# Patient Record
Sex: Male | Born: 1955 | Race: Black or African American | Hispanic: No | Marital: Married | Smoking: Former smoker
Health system: Southern US, Community
[De-identification: ages and names within clinical notes are randomized; demographics above are authoritative.]

## PROBLEM LIST (undated history)

## (undated) DIAGNOSIS — I10 Essential (primary) hypertension: Secondary | ICD-10-CM

## (undated) DIAGNOSIS — L309 Dermatitis, unspecified: Secondary | ICD-10-CM

## (undated) DIAGNOSIS — M1990 Unspecified osteoarthritis, unspecified site: Secondary | ICD-10-CM

## (undated) DIAGNOSIS — T7840XA Allergy, unspecified, initial encounter: Secondary | ICD-10-CM

## (undated) DIAGNOSIS — E785 Hyperlipidemia, unspecified: Secondary | ICD-10-CM

## (undated) DIAGNOSIS — J45909 Unspecified asthma, uncomplicated: Secondary | ICD-10-CM

## (undated) DIAGNOSIS — F1910 Other psychoactive substance abuse, uncomplicated: Secondary | ICD-10-CM

## (undated) HISTORY — DX: Other psychoactive substance abuse, uncomplicated: F19.10

## (undated) HISTORY — DX: Allergy, unspecified, initial encounter: T78.40XA

## (undated) HISTORY — DX: Essential (primary) hypertension: I10

## (undated) HISTORY — PX: KNEE ARTHROSCOPY: SUR90

## (undated) HISTORY — DX: Hyperlipidemia, unspecified: E78.5

## (undated) HISTORY — DX: Dermatitis, unspecified: L30.9

## (undated) HISTORY — PX: COLONOSCOPY: SHX174

## (undated) HISTORY — PX: WISDOM TOOTH EXTRACTION: SHX21

## (undated) HISTORY — DX: Unspecified asthma, uncomplicated: J45.909

---

## 1974-04-08 DIAGNOSIS — F1910 Other psychoactive substance abuse, uncomplicated: Secondary | ICD-10-CM

## 1974-04-08 HISTORY — DX: Other psychoactive substance abuse, uncomplicated: F19.10

## 2004-04-05 ENCOUNTER — Ambulatory Visit: Payer: Self-pay | Admitting: Gastroenterology

## 2004-04-16 ENCOUNTER — Ambulatory Visit: Payer: Self-pay | Admitting: Gastroenterology

## 2004-04-30 ENCOUNTER — Ambulatory Visit: Payer: Self-pay | Admitting: Cardiology

## 2004-05-07 ENCOUNTER — Ambulatory Visit: Payer: Self-pay

## 2004-05-07 ENCOUNTER — Ambulatory Visit: Payer: Self-pay | Admitting: Internal Medicine

## 2004-05-21 ENCOUNTER — Ambulatory Visit: Payer: Self-pay

## 2004-05-24 ENCOUNTER — Ambulatory Visit: Payer: Self-pay

## 2004-06-04 ENCOUNTER — Ambulatory Visit: Payer: Self-pay | Admitting: Cardiology

## 2009-05-04 ENCOUNTER — Encounter (INDEPENDENT_AMBULATORY_CARE_PROVIDER_SITE_OTHER): Payer: Self-pay | Admitting: *Deleted

## 2010-05-08 NOTE — Letter (Signed)
Summary: Colonoscopy Date Change Letter  [facility]  [address] [place], [place] [address]   Phone: [phone]  Fax: [phone]      May 04, 2009 MRN: [id number]   Larry Blair [address] [place], [place]  [address]   Dear [name],   Previously you were recommended to have a repeat colonoscopy around this time. Your chart was recently reviewed by Dr. Arlyce Dice of [facility]. Follow up colonoscopy is now recommended in JAN 2016. This revised recommendation is based on current, nationally recognized guidelines for colorectal cancer screening and polyp surveillance. These guidelines are endorsed by the American Cancer Society, [employer] on Colorectal Cancer as well as numerous other major medical organizations.  Please understand that our recommendation assumes that you do not have any new symptoms such as bleeding, a change in bowel habits, anemia, or significant abdominal discomfort. If you do have any concerning GI symptoms or want to discuss the guideline recommendations, please call to arrange an office visit at your earliest convenience. Otherwise we will keep you in our reminder system and contact you 1-2 months prior to the date listed above to schedule your next colonoscopy.  Thank you,  Barbette Hair. Arlyce Dice, M.D.  [facility] [phone]

## 2014-04-26 ENCOUNTER — Encounter: Payer: Self-pay | Admitting: Gastroenterology

## 2017-06-30 ENCOUNTER — Encounter: Payer: Self-pay | Admitting: Internal Medicine

## 2017-06-30 ENCOUNTER — Ambulatory Visit (INDEPENDENT_AMBULATORY_CARE_PROVIDER_SITE_OTHER): Payer: Self-pay | Admitting: Internal Medicine

## 2017-06-30 ENCOUNTER — Other Ambulatory Visit (INDEPENDENT_AMBULATORY_CARE_PROVIDER_SITE_OTHER): Payer: Self-pay

## 2017-06-30 VITALS — BP 176/110 | HR 82 | Temp 98.6°F | Resp 16 | Ht 71.0 in | Wt 208.5 lb

## 2017-06-30 DIAGNOSIS — Z0000 Encounter for general adult medical examination without abnormal findings: Secondary | ICD-10-CM

## 2017-06-30 DIAGNOSIS — K921 Melena: Secondary | ICD-10-CM

## 2017-06-30 DIAGNOSIS — E559 Vitamin D deficiency, unspecified: Secondary | ICD-10-CM

## 2017-06-30 DIAGNOSIS — I10 Essential (primary) hypertension: Secondary | ICD-10-CM

## 2017-06-30 DIAGNOSIS — Z23 Encounter for immunization: Secondary | ICD-10-CM

## 2017-06-30 DIAGNOSIS — E785 Hyperlipidemia, unspecified: Secondary | ICD-10-CM

## 2017-06-30 LAB — LIPID PANEL
CHOLESTEROL: 180 mg/dL (ref 0–200)
HDL: 81.9 mg/dL (ref >39.00)
LDL CALC: 86 mg/dL (ref 0–99)
NonHDL: 97.98
TRIGLYCERIDES: 62 mg/dL (ref 0.0–149.0)
Total CHOL/HDL Ratio: 2
VLDL: 12.4 mg/dL (ref 0.0–40.0)

## 2017-06-30 LAB — COMPREHENSIVE METABOLIC PANEL
ALT: 15 U/L (ref 0–53)
AST: 20 U/L (ref 0–37)
Albumin: 4 g/dL (ref 3.5–5.2)
Alkaline Phosphatase: 57 U/L (ref 39–117)
BUN: 17 mg/dL (ref 6–23)
CHLORIDE: 103 meq/L (ref 96–112)
CO2: 27 meq/L (ref 19–32)
CREATININE: 0.85 mg/dL (ref 0.40–1.50)
Calcium: 9.4 mg/dL (ref 8.4–10.5)
GFR: 116.83 mL/min (ref >60.00)
Glucose, Bld: 112 mg/dL — ABNORMAL HIGH (ref 70–99)
POTASSIUM: 3.8 meq/L (ref 3.5–5.1)
SODIUM: 139 meq/L (ref 135–145)
Total Bilirubin: 0.8 mg/dL (ref 0.2–1.2)
Total Protein: 7.5 g/dL (ref 6.0–8.3)

## 2017-06-30 LAB — URINALYSIS, ROUTINE W REFLEX MICROSCOPIC
Bilirubin Urine: NEGATIVE
HGB URINE DIPSTICK: NEGATIVE
KETONES UR: NEGATIVE
Leukocytes, UA: NEGATIVE
NITRITE: NEGATIVE
RBC / HPF: NONE SEEN (ref 0–?)
URINE GLUCOSE: NEGATIVE
UROBILINOGEN UA: 0.2 (ref 0.0–1.0)
WBC UA: NONE SEEN (ref 0–?)
pH: 5.5 (ref 5.0–8.0)

## 2017-06-30 LAB — CBC WITH DIFFERENTIAL/PLATELET
BASOS PCT: 0.7 % (ref 0.0–3.0)
Basophils Absolute: 0.1 10*3/uL (ref 0.0–0.1)
EOS ABS: 0.1 10*3/uL (ref 0.0–0.7)
Eosinophils Relative: 1 % (ref 0.0–5.0)
HCT: 43.1 % (ref 39.0–52.0)
Hemoglobin: 14.2 g/dL (ref 13.0–17.0)
LYMPHS ABS: 2 10*3/uL (ref 0.7–4.0)
Lymphocytes Relative: 23.7 % (ref 12.0–46.0)
MCHC: 33.1 g/dL (ref 30.0–36.0)
MCV: 98.1 fl (ref 78.0–100.0)
MONO ABS: 0.8 10*3/uL (ref 0.1–1.0)
Monocytes Relative: 9.7 % (ref 3.0–12.0)
NEUTROS ABS: 5.6 10*3/uL (ref 1.4–7.7)
Neutrophils Relative %: 64.9 % (ref 43.0–77.0)
Platelets: 250 10*3/uL (ref 150.0–400.0)
RBC: 4.39 Mil/uL (ref 4.22–5.81)
RDW: 13.1 % (ref 11.5–15.5)
WBC: 8.6 10*3/uL (ref 4.0–10.5)

## 2017-06-30 LAB — PSA: PSA: 1.61 ng/mL (ref 0.10–4.00)

## 2017-06-30 LAB — VITAMIN D 25 HYDROXY (VIT D DEFICIENCY, FRACTURES): VITD: 17.39 ng/mL — ABNORMAL LOW (ref 30.00–100.00)

## 2017-06-30 LAB — TSH: TSH: 2.84 u[IU]/mL (ref 0.35–4.50)

## 2017-06-30 MED ORDER — CHLORTHALIDONE 25 MG PO TABS: 25.0000 mg | ORAL_TABLET | Freq: Every day | ORAL | 0 refills | Status: DC

## 2017-06-30 MED ORDER — CARVEDILOL 3.125 MG PO TABS: 3.1250 mg | ORAL_TABLET | Freq: Two times a day (BID) | ORAL | 0 refills | Status: DC

## 2017-06-30 MED ORDER — CHOLECALCIFEROL 50 MCG (2000 UT) PO TABS: 1.0000 | ORAL_TABLET | Freq: Every day | ORAL | 1 refills | Status: DC

## 2017-06-30 NOTE — Progress Notes (Signed)
Subjective:  Patient ID: Larry Blair, male    DOB: 18-May-1955  Age: 62 y.o. MRN: [id number]  CC: Hypertension and Annual Exam   HPI Brittian Renaldo presents for a CPX.  He has a hx of HTN but has not treated this in several years. He denies CP, DOE, SOB, palpitations, edema, or fatigue.  History Stancil has a past medical history of Asthma and HTN (hypertension).   He has no past surgical history on file.   His family history includes Cancer in his maternal grandfather and maternal grandmother; Colon cancer in his father; Early death in his brother and sister; Kidney failure in his brother and sister.He reports that he quit smoking about 5 years ago. His smoking use included cigarettes and cigars. He quit smokeless tobacco use about 30 years ago. His smokeless tobacco use included chew. He reports that he drinks about 21.0 oz of alcohol per week. He reports that he has current or past drug history. Drug: Marijuana.  No outpatient medications prior to visit.   No facility-administered medications prior to visit.     ROS Review of Systems  Constitutional: Negative.  Negative for appetite change, diaphoresis, fatigue and unexpected weight change.  HENT: Negative.  Negative for sore throat and trouble swallowing.   Eyes: Negative.   Respiratory: Negative.  Negative for cough, choking, chest tightness, shortness of breath, wheezing and stridor.   Cardiovascular: Negative for chest pain, palpitations and leg swelling.  Gastrointestinal: Positive for blood in stool. Negative for abdominal pain, constipation, diarrhea, nausea and vomiting.       He noticed blood on the toilet paper when he wiped about a month ago.  Endocrine: Negative.   Genitourinary: Negative.  Negative for decreased urine volume, difficulty urinating, dysuria, flank pain, frequency, hematuria and urgency.  Musculoskeletal: Negative.  Negative for arthralgias, myalgias and neck pain.  Skin: Negative.  Negative for  color change, pallor and rash.  Allergic/Immunologic: Negative.   Neurological: Negative.  Negative for dizziness, weakness, light-headedness and headaches.  Hematological: Negative for adenopathy. Does not bruise/bleed easily.  Psychiatric/Behavioral: Negative.     Objective:  BP (!) 176/110 (BP Location: Left Arm, Patient Position: Sitting, Cuff Size: Normal) Comment: BP (R) 172/100 (L) 176/102  Pulse 82   Temp 98.6 F (37 C) (Oral)   Resp 16   Ht 5\' 11"  (1.803 m)   Wt 208 lb 8 oz (94.6 kg)   SpO2 99%   BMI 29.08 kg/m   Physical Exam  Constitutional: He is oriented to person, place, and time. No distress.  HENT:  Mouth/Throat: Oropharynx is clear and moist. No oropharyngeal exudate.  Eyes: Conjunctivae are normal. Right eye exhibits no discharge. Left eye exhibits no discharge. No scleral icterus.  Neck: Normal range of motion. Neck supple. No JVD present. No thyromegaly present.  Cardiovascular: Normal rate, regular rhythm and normal heart sounds. Exam reveals no gallop and no friction rub.  No murmur heard. EKG --  Sinus  Rhythm  WITHIN NORMAL LIMITS  Pulmonary/Chest: Effort normal. No accessory muscle usage. No tachypnea. No respiratory distress. He has no wheezes. He has rhonchi in the right upper field, the right middle field, the right lower field, the left upper field, the left middle field and the left lower field. He has no rales. He exhibits no tenderness.  There are faint, diffuse, bilateral expiratory rhonchi  Abdominal: Soft. Bowel sounds are normal. He exhibits no distension and no mass. There is no tenderness. There is no rebound and no  guarding. Hernia confirmed negative in the right inguinal area and confirmed negative in the left inguinal area.  Genitourinary: Testes normal and penis normal. Rectal exam shows no external hemorrhoid, no internal hemorrhoid, no fissure, no mass, no tenderness, anal tone normal and guaiac negative stool. Prostate is enlarged (1+  smooth symm BPH). Prostate is not tender. Right testis shows no mass, no swelling and no tenderness. Left testis shows no mass, no swelling and no tenderness. No penile erythema or penile tenderness. No discharge found.  Musculoskeletal: Normal range of motion. He exhibits no edema, tenderness or deformity.  Lymphadenopathy:    He has no cervical adenopathy.       Right: No inguinal adenopathy present.       Left: No inguinal adenopathy present.  Neurological: He is alert and oriented to person, place, and time.  Skin: Skin is warm and dry. No rash noted. He is not diaphoretic. No erythema. No pallor.  Psychiatric: He has a normal mood and affect. His behavior is normal. Judgment and thought content normal.  Vitals reviewed.   Lab Results  Component Value Date   WBC 8.6 06/30/2017   HGB 14.2 06/30/2017   HCT 43.1 06/30/2017   PLT 250.0 06/30/2017   GLUCOSE 112 (H) 06/30/2017   CHOL 180 06/30/2017   TRIG 62.0 06/30/2017   HDL 81.90 06/30/2017   LDLCALC 86 06/30/2017   ALT 15 06/30/2017   AST 20 06/30/2017   NA 139 06/30/2017   K 3.8 06/30/2017   CL 103 06/30/2017   CREATININE 0.85 06/30/2017   BUN 17 06/30/2017   CO2 27 06/30/2017   TSH 2.84 06/30/2017   PSA 1.61 06/30/2017    Assessment & Plan:   Cory Munchdolph was seen today for hypertension and annual exam.  Diagnoses and all orders for this visit:  Blood in stool, frank -     Ambulatory referral to Gastroenterology  Routine general medical examination at a health care facility-  Exam completed, labs reviewed, vaccines reviewed and updated, he is referred for colon cancer screening, patient education material was given. -     Lipid panel; Future -     PSA; Future -     Hepatitis C antibody; Future  Essential hypertension- His blood pressure is not adequately well controlled.  I will treat the vitamin D deficiency.  The remainder of his labs are negative for secondary causes or endorgan damage.  His EKG is negative for LVH  or ischemia.  I will start treating the hypertension with carvedilol and chlorthalidone. -     Comprehensive metabolic panel; Future -     CBC with Differential/Platelet; Future -     VITAMIN D 25 Hydroxy (Vit-D Deficiency, Fractures); Future -     Urinalysis, Routine w reflex microscopic; Future -     TSH; Future -     EKG 12-Lead -     carvedilol (COREG) 3.125 MG tablet; Take 1 tablet (3.125 mg total) by mouth 2 (two) times daily with a meal. -     chlorthalidone (HYGROTON) 25 MG tablet; Take 1 tablet (25 mg total) by mouth daily.  Vitamin D deficiency -     Cholecalciferol 2000 units TABS; Take 1 tablet (2,000 Units total) by mouth daily.  Need for Tdap vaccination -     Tdap vaccine greater than or equal to 7yo IM  Need for influenza vaccination -     Flu Vaccine QUAD 36+ mos IM  Hyperlipidemia LDL goal <70- He has an  elevated ASCVD risk score so I have asked him to start taking a statin for CV risk reduction. -     atorvastatin (LIPITOR) 10 MG tablet; Take 1 tablet (10 mg total) by mouth daily.   I am having [name] start on carvedilol, chlorthalidone, Cholecalciferol, and atorvastatin.  Meds ordered this encounter  Medications  . carvedilol (COREG) 3.125 MG tablet    Sig: Take 1 tablet (3.125 mg total) by mouth 2 (two) times daily with a meal.    Dispense:  180 tablet    Refill:  0  . chlorthalidone (HYGROTON) 25 MG tablet    Sig: Take 1 tablet (25 mg total) by mouth daily.    Dispense:  90 tablet    Refill:  0  . Cholecalciferol 2000 units TABS    Sig: Take 1 tablet (2,000 Units total) by mouth daily.    Dispense:  90 tablet    Refill:  1  . atorvastatin (LIPITOR) 10 MG tablet    Sig: Take 1 tablet (10 mg total) by mouth daily.    Dispense:  90 tablet    Refill:  1     Follow-up: Return in about 6 weeks (around 08/11/2017).  Sanda Linger, MD

## 2017-06-30 NOTE — Patient Instructions (Signed)

## 2017-07-01 ENCOUNTER — Encounter: Payer: Self-pay | Admitting: Internal Medicine

## 2017-07-01 DIAGNOSIS — E785 Hyperlipidemia, unspecified: Secondary | ICD-10-CM | POA: Insufficient documentation

## 2017-07-01 LAB — HEPATITIS C ANTIBODY
Hepatitis C Ab: NONREACTIVE
SIGNAL TO CUT-OFF: 0.02 (ref <1.00)

## 2017-07-01 MED ORDER — ATORVASTATIN CALCIUM 10 MG PO TABS: 10.0000 mg | ORAL_TABLET | Freq: Every day | ORAL | 1 refills | Status: DC

## 2017-07-14 ENCOUNTER — Encounter: Payer: Self-pay | Admitting: Gastroenterology

## 2017-08-11 ENCOUNTER — Encounter: Payer: Self-pay | Admitting: Internal Medicine

## 2017-08-11 ENCOUNTER — Ambulatory Visit (INDEPENDENT_AMBULATORY_CARE_PROVIDER_SITE_OTHER): Payer: Self-pay | Admitting: Internal Medicine

## 2017-08-11 VITALS — BP 130/90 | HR 85 | Temp 98.2°F | Resp 16 | Ht 71.0 in | Wt 201.0 lb

## 2017-08-11 DIAGNOSIS — I10 Essential (primary) hypertension: Secondary | ICD-10-CM

## 2017-08-11 DIAGNOSIS — L309 Dermatitis, unspecified: Secondary | ICD-10-CM | POA: Insufficient documentation

## 2017-08-11 MED ORDER — CRISABOROLE 2 % EX OINT: 1.0000 | TOPICAL_OINTMENT | Freq: Two times a day (BID) | CUTANEOUS | 5 refills | Status: DC

## 2017-08-11 NOTE — Patient Instructions (Signed)

## 2017-08-11 NOTE — Progress Notes (Signed)
Subjective:  Patient ID: Larry Blair, male    DOB: 1955-08-18  Age: 62 y.o. MRN: [id number]  CC: Rash and Hypertension   HPI Leeandre Nordling presents for a BP check - He has been compliant with the carvedilol and chlorthalidone.  He tells me his blood pressure has been well controlled.  He has had no episodes of dizziness, lightheadedness, DOE, chest pain, or shortness of breath.  He complains of a chronic recurrent rash on both hands.  He says he previously saw a dermatologist and was diagnosed with allergic hand eczema.  He does not know what he is allergic to.  He has tried steroids for symptom relief but has not gotten much improvement with that.  Denies any recent known exposures or contacts.  Outpatient Medications Prior to Visit  Medication Sig Dispense Refill  . atorvastatin (LIPITOR) 10 MG tablet Take 1 tablet (10 mg total) by mouth daily. 90 tablet 1  . carvedilol (COREG) 3.125 MG tablet Take 1 tablet (3.125 mg total) by mouth 2 (two) times daily with a meal. 180 tablet 0  . chlorthalidone (HYGROTON) 25 MG tablet Take 1 tablet (25 mg total) by mouth daily. 90 tablet 0  . Cholecalciferol 2000 units TABS Take 1 tablet (2,000 Units total) by mouth daily. (Patient not taking: Reported on 08/11/2017) 90 tablet 1   No facility-administered medications prior to visit.     ROS Review of Systems  Constitutional: Negative.  Negative for chills, diaphoresis, fatigue and fever.  HENT: Negative.   Eyes: Negative for visual disturbance.  Respiratory: Negative for cough, chest tightness, shortness of breath and wheezing.   Cardiovascular: Negative for chest pain, palpitations and leg swelling.  Gastrointestinal: Negative for abdominal pain, constipation, diarrhea, nausea and vomiting.  Endocrine: Negative.   Genitourinary: Negative.   Musculoskeletal: Negative.  Negative for arthralgias and myalgias.  Skin: Positive for rash. Negative for color change.  Neurological: Negative.  Negative  for dizziness, weakness and light-headedness.  Hematological: Negative for adenopathy. Does not bruise/bleed easily.  Psychiatric/Behavioral: Negative.     Objective:  BP 130/90 (BP Location: Left Arm, Patient Position: Sitting, Cuff Size: Large)   Pulse 85   Temp 98.2 F (36.8 C) (Oral)   Resp 16   Ht  (1.803 m)   Wt 201 lb (91.2 kg)   SpO2 96%   BMI 28.03 kg/m   BP Readings from Last 3 Encounters:  08/11/17 130/90  06/30/17 (!) 176/110    Wt Readings from Last 3 Encounters:  08/11/17 201 lb (91.2 kg)  06/30/17 208 lb 8 oz (94.6 kg)    Physical Exam  Constitutional: No distress.  HENT:  Mouth/Throat: Oropharynx is clear and moist. No oropharyngeal exudate.  Eyes: Conjunctivae are normal. No scleral icterus.  Neck: Normal range of motion. Neck supple. No JVD present. No thyromegaly present.  Cardiovascular: Normal rate, regular rhythm and normal heart sounds. Exam reveals no gallop and no friction rub.  No murmur heard. Pulmonary/Chest: Effort normal and breath sounds normal. No stridor. No respiratory distress. He has no wheezes. He has no rales.  Abdominal: Soft. Bowel sounds are normal.  Lymphadenopathy:    He has no cervical adenopathy.  Skin: Skin is warm and dry. Rash noted. He is not diaphoretic.  On the palmar sides of both hands there is mild diffuse hyperkeratosis.  Diffusely, on the dorsum of both hands extending onto the dorsum of both wrists there is fissuring, erythema, and scaly papules.  There are no vesicles or  pustules.  There is no erythema, induration, or fluctuance.  Vitals reviewed.   Lab Results  Component Value Date   WBC 8.6 06/30/2017   HGB 14.2 06/30/2017   HCT 43.1 06/30/2017   PLT 250.0 06/30/2017   GLUCOSE 112 (H) 06/30/2017   CHOL 180 06/30/2017   TRIG 62.0 06/30/2017   HDL 81.90 06/30/2017   LDLCALC 86 06/30/2017   ALT 15 06/30/2017   AST 20 06/30/2017   NA 139 06/30/2017   K 3.8 06/30/2017   CL 103 06/30/2017    CREATININE 0.85 06/30/2017   BUN 17 06/30/2017   CO2 27 06/30/2017   TSH 2.84 06/30/2017   PSA 1.61 06/30/2017    No results found.  Assessment & Plan:   Foster was seen today for rash and hypertension.  Diagnoses and all orders for this visit:  Chronic eczema of hand -     Crisaborole (EUCRISA) 2 % OINT; Apply 1 Act topically 2 (two) times daily.  Essential hypertension- His blood pressure is well controlled on the combination of carvedilol and chlorthalidone.  Will continue these 2 meds at the current doses.   I am having [name] start on [place]. I am also having him maintain his carvedilol, chlorthalidone, Cholecalciferol, and atorvastatin.  Meds ordered this encounter  Medications  . Crisaborole (EUCRISA) 2 % OINT    Sig: Apply 1 Act topically 2 (two) times daily.    Dispense:  60 g    Refill:  5     Follow-up: Return in about 6 months (around 02/11/2018).  Sanda Linger, MD

## 2017-09-06 HISTORY — PX: COLONOSCOPY: SHX174

## 2017-09-08 ENCOUNTER — Ambulatory Visit (AMBULATORY_SURGERY_CENTER): Payer: Self-pay | Admitting: *Deleted

## 2017-09-08 ENCOUNTER — Other Ambulatory Visit: Payer: Self-pay

## 2017-09-08 VITALS — Ht 70.0 in | Wt 202.8 lb

## 2017-09-08 DIAGNOSIS — Z800 Family history of malignant neoplasm of digestive organs: Secondary | ICD-10-CM

## 2017-09-08 DIAGNOSIS — K921 Melena: Secondary | ICD-10-CM

## 2017-09-08 MED ORDER — PEG-KCL-NACL-NASULF-NA ASC-C 140 G PO SOLR: 1.0000 | ORAL | 0 refills | Status: DC

## 2017-09-08 NOTE — Progress Notes (Signed)
No egg or soy allergy known to patient  No issues with past sedation with any surgeries  or procedures, no intubation problems  No diet pills per patient No home 02 use per patient  No blood thinners per patient  Pt denies issues with constipation  No A fib or A flutter  EMMI video sent to pt's e mail  Self pay, patient given Plenvu sample  Lot [id number] Exp 10/2018, use as directed

## 2017-09-22 ENCOUNTER — Ambulatory Visit (AMBULATORY_SURGERY_CENTER): Payer: Self-pay | Admitting: Gastroenterology

## 2017-09-22 ENCOUNTER — Other Ambulatory Visit: Payer: Self-pay

## 2017-09-22 ENCOUNTER — Encounter: Payer: Self-pay | Admitting: Gastroenterology

## 2017-09-22 VITALS — BP 167/97 | HR 70 | Temp 96.8°F | Resp 16 | Ht 71.0 in | Wt 201.0 lb

## 2017-09-22 DIAGNOSIS — Z1211 Encounter for screening for malignant neoplasm of colon: Secondary | ICD-10-CM

## 2017-09-22 DIAGNOSIS — Z800 Family history of malignant neoplasm of digestive organs: Secondary | ICD-10-CM

## 2017-09-22 LAB — HM COLONOSCOPY

## 2017-09-22 MED ORDER — SODIUM CHLORIDE 0.9 % IV SOLN: 500.0000 mL | Freq: Once | INTRAVENOUS | Status: DC

## 2017-09-22 NOTE — Patient Instructions (Signed)
Handouts given:  Hemorrhoids  YOU HAD AN ENDOSCOPIC PROCEDURE TODAY AT THE [facility]:   Refer to the procedure report that was given to you for any specific questions about what was found during the examination.  If the procedure report does not answer your questions, please call your gastroenterologist to clarify.  If you requested that your care partner not be given the details of your procedure findings, then the procedure report has been included in a sealed envelope for you to review at your convenience later.  YOU SHOULD EXPECT: Some feelings of bloating in the abdomen. Passage of more gas than usual.  Walking can help get rid of the air that was put into your GI tract during the procedure and reduce the bloating. If you had a lower endoscopy (such as a colonoscopy or flexible sigmoidoscopy) you may notice spotting of blood in your stool or on the toilet paper. If you underwent a bowel prep for your procedure, you may not have a normal bowel movement for a few days.  Please Note:  You might notice some irritation and congestion in your nose or some drainage.  This is from the oxygen used during your procedure.  There is no need for concern and it should clear up in a day or so.  SYMPTOMS TO REPORT IMMEDIATELY:   Following lower endoscopy (colonoscopy or flexible sigmoidoscopy):  Excessive amounts of blood in the stool  Significant tenderness or worsening of abdominal pains  Swelling of the abdomen that is new, acute  Fever of 100F or higher  For urgent or emergent issues, a gastroenterologist can be reached at any hour by calling [phone].   DIET:  We do recommend a small meal at first, but then you may proceed to your regular diet.  Drink plenty of fluids but you should avoid alcoholic beverages for 24 hours.  ACTIVITY:  You should plan to take it easy for the rest of today and you should NOT DRIVE or use heavy machinery until tomorrow (because of the sedation  medicines used during the test).    FOLLOW UP: Our staff will call the number listed on your records the next business day following your procedure to check on you and address any questions or concerns that you may have regarding the information given to you following your procedure. If we do not reach you, we will leave a message.  However, if you are feeling well and you are not experiencing any problems, there is no need to return our call.  We will assume that you have returned to your regular daily activities without incident.  If any biopsies were taken you will be contacted by phone or by letter within the next 1-3 weeks.  Please call us at [phone] if you have not heard about the biopsies in 3 weeks.    SIGNATURES/CONFIDENTIALITY: You and/or your care partner have signed paperwork which will be entered into your electronic medical record.  These signatures attest to the fact that that the information above on your After Visit Summary has been reviewed and is understood.  Full responsibility of the confidentiality of this discharge information lies with you and/or your care-partner. 

## 2017-09-22 NOTE — Op Note (Signed)
[facility] Patient Name: Larry Blair Procedure Date: 09/22/2017 8:45 AM MRN: [id number] Endoscopist: Sherilyn CooterHenry L. Myrtie Neitheranis , MD Age: 62 Referring MD:  Date of Birth: Aug 13, 1955 Gender: Male Account #: [id number] Procedure:                Colonoscopy Indications:              Screening in patient at increased risk: Colorectal                            cancer in father 7660 or older Medicines:                Monitored Anesthesia Care Procedure:                Pre-Anesthesia Assessment:                           - Prior to the procedure, a History and Physical                            was performed, and patient medications and                            allergies were reviewed. The patient's tolerance of                            previous anesthesia was also reviewed. The risks                            and benefits of the procedure and the sedation                            options and risks were discussed with the patient.                            All questions were answered, and informed consent                            was obtained. Prior Anticoagulants: The patient has                            taken no previous anticoagulant or antiplatelet                            agents. ASA Grade Assessment: II - A patient with                            mild systemic disease. After reviewing the risks                            and benefits, the patient was deemed in                            satisfactory condition to undergo the procedure.  After obtaining informed consent, the colonoscope                            was passed under direct vision. Throughout the                            procedure, the patient's blood pressure, pulse, and                            oxygen saturations were monitored continuously. The                            Colonoscope was introduced through the anus and                            advanced to the the cecum,  identified by                            appendiceal orifice and ileocecal valve. The                            colonoscopy was performed without difficulty. The                            patient tolerated the procedure well. The quality                            of the bowel preparation was excellent. The                            ileocecal valve, appendiceal orifice, and rectum                            were photographed. Scope In: 8:51:28 AM Scope Out: 9:02:47 AM Scope Withdrawal Time: 0 hours 8 minutes 37 seconds  Total Procedure Duration: 0 hours 11 minutes 19 seconds  Findings:                 The perianal and digital rectal examinations were                            normal.                           Internal hemorrhoids were found. The hemorrhoids                            were Grade I (internal hemorrhoids that do not                            prolapse).                           The exam was otherwise without abnormality on  direct and retroflexion views. Complications:            No immediate complications. Estimated Blood Loss:     Estimated blood loss: none. Impression:               - Internal hemorrhoids.                           - The examination was otherwise normal on direct                            and retroflexion views.                           - No specimens collected. Recommendation:           - Patient has a contact number available for                            emergencies. The signs and symptoms of potential                            delayed complications were discussed with the                            patient. Return to normal activities tomorrow.                            Written discharge instructions were provided to the                            patient.                           - Resume previous diet.                           - Continue present medications.                           - Repeat colonoscopy in  5 years for screening                            purposes. Josiephine Simao L. Myrtie Neither, MD 09/22/2017 9:05:10 AM This report has been signed electronically.

## 2017-09-22 NOTE — Progress Notes (Signed)
Report to PACU, RN, vss, BBS= Clear.  

## 2017-09-22 NOTE — Progress Notes (Signed)
Pt's states no medical or surgical changes since previsit or office visit. 

## 2017-09-23 ENCOUNTER — Telehealth: Payer: Self-pay

## 2017-09-23 NOTE — Telephone Encounter (Signed)
  Follow up Call-  Call back number 09/22/2017  Post procedure Call Back phone  # [phone]  Permission to leave phone message Yes  Some recent data might be hidden     Patient questions:  Do you have a fever, pain , or abdominal swelling? No. Pain Score  0 *  Have you tolerated food without any problems? Yes.    Have you been able to return to your normal activities? Yes.    Do you have any questions about your discharge instructions: Diet   No. Medications  No. Follow up visit  No.  Do you have questions or concerns about your Care? No.  Actions: * If pain score is 4 or above: No action needed, pain <4.

## 2017-10-29 ENCOUNTER — Other Ambulatory Visit: Payer: Self-pay | Admitting: Internal Medicine

## 2017-10-29 DIAGNOSIS — I10 Essential (primary) hypertension: Secondary | ICD-10-CM

## 2017-11-24 ENCOUNTER — Encounter: Payer: Self-pay | Admitting: Internal Medicine

## 2017-11-24 ENCOUNTER — Other Ambulatory Visit (INDEPENDENT_AMBULATORY_CARE_PROVIDER_SITE_OTHER): Payer: Self-pay

## 2017-11-24 ENCOUNTER — Ambulatory Visit (INDEPENDENT_AMBULATORY_CARE_PROVIDER_SITE_OTHER): Payer: Self-pay | Admitting: Internal Medicine

## 2017-11-24 VITALS — BP 140/100 | HR 72 | Temp 98.5°F | Resp 16 | Ht 71.0 in | Wt 197.2 lb

## 2017-11-24 DIAGNOSIS — T502X5A Adverse effect of carbonic-anhydrase inhibitors, benzothiadiazides and other diuretics, initial encounter: Secondary | ICD-10-CM

## 2017-11-24 DIAGNOSIS — R1115 Cyclical vomiting syndrome unrelated to migraine: Secondary | ICD-10-CM

## 2017-11-24 DIAGNOSIS — E876 Hypokalemia: Secondary | ICD-10-CM

## 2017-11-24 DIAGNOSIS — G43A0 Cyclical vomiting, not intractable: Secondary | ICD-10-CM

## 2017-11-24 DIAGNOSIS — I10 Essential (primary) hypertension: Secondary | ICD-10-CM

## 2017-11-24 LAB — CBC WITH DIFFERENTIAL/PLATELET
BASOS ABS: 0 10*3/uL (ref 0.0–0.1)
Basophils Relative: 0.5 % (ref 0.0–3.0)
EOS PCT: 1.7 % (ref 0.0–5.0)
Eosinophils Absolute: 0.1 10*3/uL (ref 0.0–0.7)
HEMATOCRIT: 39.9 % (ref 39.0–52.0)
Hemoglobin: 13.4 g/dL (ref 13.0–17.0)
LYMPHS ABS: 2.8 10*3/uL (ref 0.7–4.0)
LYMPHS PCT: 39.3 % (ref 12.0–46.0)
MCHC: 33.5 g/dL (ref 30.0–36.0)
MCV: 96.6 fl (ref 78.0–100.0)
MONOS PCT: 9.9 % (ref 3.0–12.0)
Monocytes Absolute: 0.7 10*3/uL (ref 0.1–1.0)
NEUTROS PCT: 48.6 % (ref 43.0–77.0)
Neutro Abs: 3.5 10*3/uL (ref 1.4–7.7)
Platelets: 253 10*3/uL (ref 150.0–400.0)
RBC: 4.13 Mil/uL — AB (ref 4.22–5.81)
RDW: 12.9 % (ref 11.5–15.5)
WBC: 7.1 10*3/uL (ref 4.0–10.5)

## 2017-11-24 LAB — COMPREHENSIVE METABOLIC PANEL
ALK PHOS: 49 U/L (ref 39–117)
ALT: 23 U/L (ref 0–53)
AST: 24 U/L (ref 0–37)
Albumin: 4.2 g/dL (ref 3.5–5.2)
BILIRUBIN TOTAL: 1.2 mg/dL (ref 0.2–1.2)
BUN: 20 mg/dL (ref 6–23)
CALCIUM: 9.4 mg/dL (ref 8.4–10.5)
CO2: 33 meq/L — AB (ref 19–32)
Chloride: 97 mEq/L (ref 96–112)
Creatinine, Ser: 1.07 mg/dL (ref 0.40–1.50)
GFR: 89.46 mL/min (ref >60.00)
GLUCOSE: 99 mg/dL (ref 70–99)
Potassium: 2.9 mEq/L — ABNORMAL LOW (ref 3.5–5.1)
Sodium: 136 mEq/L (ref 135–145)
TOTAL PROTEIN: 7.2 g/dL (ref 6.0–8.3)

## 2017-11-24 LAB — AMYLASE: Amylase: 37 U/L (ref 27–131)

## 2017-11-24 LAB — LIPASE: Lipase: 18 U/L (ref 11.0–59.0)

## 2017-11-24 MED ORDER — CARVEDILOL 6.25 MG PO TABS: 6.2500 mg | ORAL_TABLET | Freq: Two times a day (BID) | ORAL | 1 refills | Status: DC

## 2017-11-24 MED ORDER — SPIRONOLACTONE 25 MG PO TABS: 25.0000 mg | ORAL_TABLET | Freq: Every day | ORAL | 3 refills | Status: DC

## 2017-11-24 MED ORDER — PROMETHAZINE HCL 12.5 MG PO TABS: 12.5000 mg | ORAL_TABLET | Freq: Four times a day (QID) | ORAL | 1 refills | Status: DC | PRN

## 2017-11-24 NOTE — Progress Notes (Signed)
Subjective:  Patient ID: Larry Blair, male    DOB: April 13, 1955  Age: 62 y.o. MRN: [id number]  CC: Hypertension   HPI Larry Blair presents for f/up - He complains of intermittent episodes of vomiting that have been occurring for greater than 2 years.  He describes about 3 or 4 times a year he has a day or 2 where he has spontaneous vomiting without much nausea.  He denies headache, abdominal pain, loss of appetite, weight loss, odynophagia, dysphagia, diarrhea, or constipation.  His risk factors for spontaneous vomiting are alcohol intake and marijuana abuse.    He is also due for blood pressure check. He is concerned that his blood pressure has not been well controlled but he denies any recent episodes of headache, blurred vision, chest pain, or shortness of breath.  Outpatient Medications Prior to Visit  Medication Sig Dispense Refill  . atorvastatin (LIPITOR) 10 MG tablet Take 1 tablet (10 mg total) by mouth daily. 90 tablet 1  . Cholecalciferol 2000 units TABS Take 1 tablet (2,000 Units total) by mouth daily. 90 tablet 1  . Crisaborole (EUCRISA) 2 % OINT Apply 1 Act topically 2 (two) times daily. 60 g 5  . fexofenadine (ALLEGRA) 180 MG tablet Take 180 mg by mouth daily.    . Multiple Vitamin (MULTIVITAMIN) tablet Take 1 tablet by mouth daily.    . carvedilol (COREG) 3.125 MG tablet TAKE 1 TABLET BY MOUTH TWICE DAILY WITH A MEAL 180 tablet 0  . chlorthalidone (HYGROTON) 25 MG tablet TAKE 1 TABLET BY MOUTH ONCE DAILY 90 tablet 0  . oxymetazoline (AFRIN) 0.05 % nasal spray Place 1 spray into both nostrils 2 (two) times daily.    Marland Kitchen. 0.9 %  sodium chloride infusion      No facility-administered medications prior to visit.     ROS Review of Systems  Constitutional: Negative for appetite change, diaphoresis, fatigue and unexpected weight change.  HENT: Negative.  Negative for trouble swallowing.   Eyes: Negative for visual disturbance.  Respiratory: Negative for cough, chest  tightness, shortness of breath and wheezing.   Cardiovascular: Negative for chest pain, palpitations and leg swelling.  Gastrointestinal: Positive for vomiting. Negative for abdominal pain, constipation, diarrhea and nausea.  Genitourinary: Negative.  Negative for difficulty urinating, dysuria and urgency.  Musculoskeletal: Negative.  Negative for arthralgias, back pain, myalgias and neck pain.  Skin: Negative for color change, pallor and rash.  Neurological: Negative.  Negative for dizziness, weakness, light-headedness and headaches.  Hematological: Negative for adenopathy. Does not bruise/bleed easily.  Psychiatric/Behavioral: Negative.     Objective:  BP (!) 140/100 (BP Location: Left Arm, Patient Position: Sitting, Cuff Size: Normal)   Pulse 72   Temp 98.5 F (36.9 C) (Oral)   Resp 16   Ht 5\' 11"  (1.803 m)   Wt 197 lb 4 oz (89.5 kg)   SpO2 98%   BMI 27.51 kg/m   BP Readings from Last 3 Encounters:  11/24/17 (!) 140/100  09/22/17 (!) 167/97  08/11/17 130/90    Wt Readings from Last 3 Encounters:  11/24/17 197 lb 4 oz (89.5 kg)  09/22/17 201 lb (91.2 kg)  09/08/17 202 lb 12.8 oz (92 kg)    Physical Exam  Constitutional: He is oriented to person, place, and time. No distress.  HENT:  Mouth/Throat: Oropharynx is clear and moist. No oropharyngeal exudate.  Eyes: Conjunctivae are normal. No scleral icterus.  Neck: Normal range of motion. Neck supple. No JVD present. No thyromegaly present.  Cardiovascular: Normal rate, regular rhythm and normal heart sounds. Exam reveals no gallop.  No murmur heard. Pulmonary/Chest: Effort normal and breath sounds normal. He has no wheezes. He has no rhonchi. He has no rales.  Abdominal: Soft. Bowel sounds are normal. He exhibits no mass. There is no hepatosplenomegaly. There is no tenderness. No hernia.  Musculoskeletal: Normal range of motion. He exhibits no edema, tenderness or deformity.  Lymphadenopathy:    He has no cervical  adenopathy.  Neurological: He is alert and oriented to person, place, and time.  Skin: Skin is warm and dry. No rash noted. He is not diaphoretic.  Psychiatric: He has a normal mood and affect. His behavior is normal. Judgment and thought content normal.  Vitals reviewed.   Lab Results  Component Value Date   WBC 7.1 11/24/2017   HGB 13.4 11/24/2017   HCT 39.9 11/24/2017   PLT 253.0 11/24/2017   GLUCOSE 99 11/24/2017   CHOL 180 06/30/2017   TRIG 62.0 06/30/2017   HDL 81.90 06/30/2017   LDLCALC 86 06/30/2017   ALT 23 11/24/2017   AST 24 11/24/2017   NA 136 11/24/2017   K 2.9 (L) 11/24/2017   CL 97 11/24/2017   CREATININE 1.07 11/24/2017   BUN 20 11/24/2017   CO2 33 (H) 11/24/2017   TSH 2.84 06/30/2017   PSA 1.61 06/30/2017    No results found.  Assessment & Plan:   Chelsey was seen today for hypertension.  Diagnoses and all orders for this visit:  Essential hypertension- His blood pressure is not well controlled and his potassium level is down to 2.9.  I have asked him to change his diuretic to spironolactone and I will increase the dose of carvedilol to try to gain better blood pressure control.  His other labs are unremarkable. -     CBC with Differential/Platelet; Future -     Comprehensive metabolic panel; Future -     carvedilol (COREG) 6.25 MG tablet; Take 1 tablet (6.25 mg total) by mouth 2 (two) times daily with a meal. -     spironolactone (ALDACTONE) 25 MG tablet; Take 1 tablet (25 mg total) by mouth daily.  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  His lab work is remarkable only for hypokalemia but this is likely related to the thiazide diuretic.  I am concerned that this may be related to his marijuana and alcohol abuse so I have asked him to abstain from both of those.  In the meantime, he can try Phenergan as needed. -     Lipase; Future -     Amylase; Future -      promethazine (PHENERGAN) 12.5 MG tablet; Take 1 tablet (12.5 mg total) by mouth every 6 (six) hours as needed for nausea or vomiting.  Diuretic-induced hypokalemia -     spironolactone (ALDACTONE) 25 MG tablet; Take 1 tablet (25 mg total) by mouth daily.   I have discontinued London Voit's oxymetazoline, carvedilol, and chlorthalidone. I am also having him start on carvedilol, promethazine, and spironolactone. Additionally, I am having him maintain his Cholecalciferol, atorvastatin, Crisaborole, multivitamin, and fexofenadine. We will stop administering sodium chloride.  Meds ordered this encounter  Medications  . carvedilol (COREG) 6.25 MG tablet    Sig: Take 1 tablet (6.25 mg total) by mouth 2 (two) times daily with a meal.    Dispense:  180 tablet    Refill:  1  .  promethazine (PHENERGAN) 12.5 MG tablet    Sig: Take 1 tablet (12.5 mg total) by mouth every 6 (six) hours as needed for nausea or vomiting.    Dispense:  30 tablet    Refill:  1  . spironolactone (ALDACTONE) 25 MG tablet    Sig: Take 1 tablet (25 mg total) by mouth daily.    Dispense:  90 tablet    Refill:  3     Follow-up: Return in about 3 months (around 02/24/2018).  Sanda Lingerhomas Tkai Serfass, MD

## 2017-11-24 NOTE — Patient Instructions (Signed)

## 2018-02-09 ENCOUNTER — Other Ambulatory Visit: Payer: Self-pay | Admitting: Internal Medicine

## 2018-02-09 ENCOUNTER — Other Ambulatory Visit (INDEPENDENT_AMBULATORY_CARE_PROVIDER_SITE_OTHER): Payer: Self-pay

## 2018-02-09 ENCOUNTER — Encounter: Payer: Self-pay | Admitting: Internal Medicine

## 2018-02-09 ENCOUNTER — Ambulatory Visit (INDEPENDENT_AMBULATORY_CARE_PROVIDER_SITE_OTHER): Payer: Self-pay | Admitting: Internal Medicine

## 2018-02-09 VITALS — BP 130/88 | HR 71 | Temp 98.1°F | Resp 16 | Ht 71.0 in | Wt 196.0 lb

## 2018-02-09 DIAGNOSIS — E785 Hyperlipidemia, unspecified: Secondary | ICD-10-CM

## 2018-02-09 DIAGNOSIS — T502X5A Adverse effect of carbonic-anhydrase inhibitors, benzothiadiazides and other diuretics, initial encounter: Secondary | ICD-10-CM

## 2018-02-09 DIAGNOSIS — I10 Essential (primary) hypertension: Secondary | ICD-10-CM

## 2018-02-09 DIAGNOSIS — E876 Hypokalemia: Secondary | ICD-10-CM

## 2018-02-09 DIAGNOSIS — Z23 Encounter for immunization: Secondary | ICD-10-CM

## 2018-02-09 LAB — BASIC METABOLIC PANEL
BUN: 23 mg/dL (ref 6–23)
CHLORIDE: 100 meq/L (ref 96–112)
CO2: 31 meq/L (ref 19–32)
Calcium: 9.4 mg/dL (ref 8.4–10.5)
Creatinine, Ser: 1.06 mg/dL (ref 0.40–1.50)
GFR: 90.37 mL/min (ref >60.00)
Glucose, Bld: 71 mg/dL (ref 70–99)
Potassium: 3.7 mEq/L (ref 3.5–5.1)
SODIUM: 138 meq/L (ref 135–145)

## 2018-02-09 LAB — MAGNESIUM: MAGNESIUM: 2.5 mg/dL (ref 1.5–2.5)

## 2018-02-09 NOTE — Progress Notes (Signed)
Subjective:  Patient ID: Larry Blair, male    DOB: Jun 29, 1955  Age: 62 y.o. MRN: [id number]  CC: Hypertension   HPI Moise Friday presents for f/up - He tells me his blood pressure has been well controlled.  He feels well and offers no complaints.  He quit drinking alcohol about 2 weeks ago.  Outpatient Medications Prior to Visit  Medication Sig Dispense Refill  . atorvastatin (LIPITOR) 10 MG tablet Take 1 tablet (10 mg total) by mouth daily. 90 tablet 1  . carvedilol (COREG) 6.25 MG tablet Take 1 tablet (6.25 mg total) by mouth 2 (two) times daily with a meal. 180 tablet 1  . Cholecalciferol 2000 units TABS Take 1 tablet (2,000 Units total) by mouth daily. 90 tablet 1  . Crisaborole (EUCRISA) 2 % OINT Apply 1 Act topically 2 (two) times daily. 60 g 5  . fexofenadine (ALLEGRA) 180 MG tablet Take 180 mg by mouth daily.    . Multiple Vitamin (MULTIVITAMIN) tablet Take 1 tablet by mouth daily.    Marland Kitchen spironolactone (ALDACTONE) 25 MG tablet Take 1 tablet (25 mg total) by mouth daily. 90 tablet 3  . promethazine (PHENERGAN) 12.5 MG tablet Take 1 tablet (12.5 mg total) by mouth every 6 (six) hours as needed for nausea or vomiting. 30 tablet 1   No facility-administered medications prior to visit.     ROS Review of Systems  Constitutional: Negative for diaphoresis and fatigue.  HENT: Negative.   Eyes: Negative for visual disturbance.  Respiratory: Negative for cough, chest tightness, shortness of breath and wheezing.   Cardiovascular: Negative for chest pain, palpitations and leg swelling.  Gastrointestinal: Negative.  Negative for abdominal pain, diarrhea, nausea and vomiting.  Genitourinary: Negative.  Negative for difficulty urinating.  Musculoskeletal: Negative.  Negative for myalgias.  Skin: Negative.   Neurological: Negative.  Negative for dizziness, weakness and light-headedness.  Hematological: Negative for adenopathy. Does not bruise/bleed easily.  Psychiatric/Behavioral:  Negative.     Objective:  BP 130/88   Pulse 71   Temp 98.1 F (36.7 C) (Oral)   Resp 16   Ht 5\' 11"  (1.803 m)   Wt 196 lb (88.9 kg)   SpO2 99%   BMI 27.34 kg/m   BP Readings from Last 3 Encounters:  02/09/18 130/88  11/24/17 (!) 140/100  09/22/17 (!) 167/97    Wt Readings from Last 3 Encounters:  02/09/18 196 lb (88.9 kg)  11/24/17 197 lb 4 oz (89.5 kg)  09/22/17 201 lb (91.2 kg)    Physical Exam  Constitutional: He is oriented to person, place, and time. No distress.  HENT:  Mouth/Throat: Oropharynx is clear and moist. No oropharyngeal exudate.  Eyes: Conjunctivae are normal. No scleral icterus.  Neck: Normal range of motion. Neck supple. No JVD present. No thyromegaly present.  Cardiovascular: Normal rate, regular rhythm and normal heart sounds. Exam reveals no gallop.  No murmur heard. Pulmonary/Chest: Effort normal and breath sounds normal. No respiratory distress. He has no wheezes. He has no rales.  Abdominal: Soft. Bowel sounds are normal. He exhibits no mass. There is no hepatosplenomegaly. There is no tenderness.  Musculoskeletal: Normal range of motion. He exhibits no edema, tenderness or deformity.  Lymphadenopathy:    He has no cervical adenopathy.  Neurological: He is alert and oriented to person, place, and time.  Skin: Skin is warm and dry. No rash noted. He is not diaphoretic.  Vitals reviewed.   Lab Results  Component Value Date   WBC 7.1  11/24/2017   HGB 13.4 11/24/2017   HCT 39.9 11/24/2017   PLT 253.0 11/24/2017   GLUCOSE 71 02/09/2018   CHOL 180 06/30/2017   TRIG 62.0 06/30/2017   HDL 81.90 06/30/2017   LDLCALC 86 06/30/2017   ALT 23 11/24/2017   AST 24 11/24/2017   NA 138 02/09/2018   K 3.7 02/09/2018   CL 100 02/09/2018   CREATININE 1.06 02/09/2018   BUN 23 02/09/2018   CO2 31 02/09/2018   TSH 2.84 06/30/2017   PSA 1.61 06/30/2017    No results found.  Assessment & Plan:   Steen was seen today for  hypertension.  Diagnoses and all orders for this visit:  Essential hypertension- His blood pressure is well controlled.  Electrolytes and renal function are normal.  Will continue the current combination of carvedilol and spironolactone. -     Basic metabolic panel; Future -     Magnesium; Future  Diuretic-induced hypokalemia- His potassium level is normal now.  Will continue spironolactone at the current dose. -     Basic metabolic panel; Future -     Magnesium; Future  Other orders -     Flu Vaccine QUAD 6+ mos PF IM (Fluarix Quad PF)   I have discontinued Murrel Weinert's promethazine. I am also having him maintain his Cholecalciferol, atorvastatin, Crisaborole, multivitamin, fexofenadine, carvedilol, and spironolactone.  No orders of the defined types were placed in this encounter.    Follow-up: Return in about 6 months (around 08/10/2018).  Sanda Linger, MD

## 2018-02-09 NOTE — Patient Instructions (Signed)

## 2018-09-02 ENCOUNTER — Other Ambulatory Visit: Payer: Self-pay | Admitting: Internal Medicine

## 2018-09-02 DIAGNOSIS — I10 Essential (primary) hypertension: Secondary | ICD-10-CM

## 2018-09-02 DIAGNOSIS — E785 Hyperlipidemia, unspecified: Secondary | ICD-10-CM

## 2019-01-28 ENCOUNTER — Other Ambulatory Visit: Payer: Self-pay | Admitting: Internal Medicine

## 2019-01-28 DIAGNOSIS — T502X5A Adverse effect of carbonic-anhydrase inhibitors, benzothiadiazides and other diuretics, initial encounter: Secondary | ICD-10-CM

## 2019-01-28 DIAGNOSIS — I10 Essential (primary) hypertension: Secondary | ICD-10-CM

## 2019-01-28 DIAGNOSIS — E876 Hypokalemia: Secondary | ICD-10-CM

## 2019-02-01 ENCOUNTER — Other Ambulatory Visit: Payer: Self-pay

## 2019-02-01 ENCOUNTER — Ambulatory Visit (INDEPENDENT_AMBULATORY_CARE_PROVIDER_SITE_OTHER): Payer: Self-pay

## 2019-02-01 DIAGNOSIS — Z23 Encounter for immunization: Secondary | ICD-10-CM

## 2019-07-12 ENCOUNTER — Ambulatory Visit (INDEPENDENT_AMBULATORY_CARE_PROVIDER_SITE_OTHER): Payer: Self-pay

## 2019-07-12 ENCOUNTER — Other Ambulatory Visit: Payer: Self-pay

## 2019-07-12 ENCOUNTER — Ambulatory Visit (INDEPENDENT_AMBULATORY_CARE_PROVIDER_SITE_OTHER): Payer: Self-pay | Admitting: Internal Medicine

## 2019-07-12 ENCOUNTER — Encounter: Payer: Self-pay | Admitting: Internal Medicine

## 2019-07-12 VITALS — BP 166/112 | Temp 98.4°F | Resp 16 | Ht 71.0 in | Wt 197.0 lb

## 2019-07-12 DIAGNOSIS — G8911 Acute pain due to trauma: Secondary | ICD-10-CM

## 2019-07-12 DIAGNOSIS — M542 Cervicalgia: Secondary | ICD-10-CM | POA: Insufficient documentation

## 2019-07-12 DIAGNOSIS — R270 Ataxia, unspecified: Secondary | ICD-10-CM

## 2019-07-12 DIAGNOSIS — M25511 Pain in right shoulder: Secondary | ICD-10-CM | POA: Insufficient documentation

## 2019-07-12 DIAGNOSIS — Z0000 Encounter for general adult medical examination without abnormal findings: Secondary | ICD-10-CM

## 2019-07-12 DIAGNOSIS — G3281 Cerebellar ataxia in diseases classified elsewhere: Secondary | ICD-10-CM

## 2019-07-12 DIAGNOSIS — E559 Vitamin D deficiency, unspecified: Secondary | ICD-10-CM

## 2019-07-12 DIAGNOSIS — E785 Hyperlipidemia, unspecified: Secondary | ICD-10-CM

## 2019-07-12 DIAGNOSIS — I10 Essential (primary) hypertension: Secondary | ICD-10-CM

## 2019-07-12 LAB — BASIC METABOLIC PANEL
BUN: 13 mg/dL (ref 6–23)
CO2: 27 mEq/L (ref 19–32)
Calcium: 9.7 mg/dL (ref 8.4–10.5)
Chloride: 104 mEq/L (ref 96–112)
Creatinine, Ser: 0.76 mg/dL (ref 0.40–1.50)
GFR: 124.28 mL/min (ref >60.00)
Glucose, Bld: 97 mg/dL (ref 70–99)
Potassium: 3.5 mEq/L (ref 3.5–5.1)
Sodium: 140 mEq/L (ref 135–145)

## 2019-07-12 LAB — CBC WITH DIFFERENTIAL/PLATELET
Basophils Absolute: 0 10*3/uL (ref 0.0–0.1)
Basophils Relative: 0.4 % (ref 0.0–3.0)
Eosinophils Absolute: 0.1 10*3/uL (ref 0.0–0.7)
Eosinophils Relative: 1 % (ref 0.0–5.0)
HCT: 43 % (ref 39.0–52.0)
Hemoglobin: 14.3 g/dL (ref 13.0–17.0)
Lymphocytes Relative: 24.3 % (ref 12.0–46.0)
Lymphs Abs: 1.6 10*3/uL (ref 0.7–4.0)
MCHC: 33.2 g/dL (ref 30.0–36.0)
MCV: 99.3 fl (ref 78.0–100.0)
Monocytes Absolute: 0.5 10*3/uL (ref 0.1–1.0)
Monocytes Relative: 7.8 % (ref 3.0–12.0)
Neutro Abs: 4.3 10*3/uL (ref 1.4–7.7)
Neutrophils Relative %: 66.5 % (ref 43.0–77.0)
Platelets: 216 10*3/uL (ref 150.0–400.0)
RBC: 4.33 Mil/uL (ref 4.22–5.81)
RDW: 12.6 % (ref 11.5–15.5)
WBC: 6.5 10*3/uL (ref 4.0–10.5)

## 2019-07-12 LAB — LIPID PANEL
Cholesterol: 193 mg/dL (ref 0–200)
HDL: 71.6 mg/dL (ref >39.00)
LDL Cholesterol: 105 mg/dL — ABNORMAL HIGH (ref 0–99)
NonHDL: 121.67
Total CHOL/HDL Ratio: 3
Triglycerides: 81 mg/dL (ref 0.0–149.0)
VLDL: 16.2 mg/dL (ref 0.0–40.0)

## 2019-07-12 LAB — TSH: TSH: 2.85 u[IU]/mL (ref 0.35–4.50)

## 2019-07-12 LAB — HEPATIC FUNCTION PANEL
ALT: 23 U/L (ref 0–53)
AST: 20 U/L (ref 0–37)
Albumin: 4.4 g/dL (ref 3.5–5.2)
Alkaline Phosphatase: 53 U/L (ref 39–117)
Bilirubin, Direct: 0.1 mg/dL (ref 0.0–0.3)
Total Bilirubin: 0.6 mg/dL (ref 0.2–1.2)
Total Protein: 7 g/dL (ref 6.0–8.3)

## 2019-07-12 LAB — VITAMIN D 25 HYDROXY (VIT D DEFICIENCY, FRACTURES): VITD: 32.12 ng/mL (ref 30.00–100.00)

## 2019-07-12 LAB — PSA: PSA: 1.47 ng/mL (ref 0.10–4.00)

## 2019-07-12 MED ORDER — ATORVASTATIN CALCIUM 10 MG PO TABS: 10.0000 mg | ORAL_TABLET | Freq: Every day | ORAL | 1 refills | Status: DC

## 2019-07-12 MED ORDER — INDAPAMIDE 1.25 MG PO TABS: 1.2500 mg | ORAL_TABLET | Freq: Every day | ORAL | 0 refills | Status: DC

## 2019-07-12 MED ORDER — CARVEDILOL 6.25 MG PO TABS: 6.2500 mg | ORAL_TABLET | Freq: Two times a day (BID) | ORAL | 1 refills | Status: DC

## 2019-07-12 NOTE — Progress Notes (Signed)
Subjective:  Patient ID: Larry Blair, male    DOB: July 28, 1955  Age: 64 y.o. MRN: [id number]  CC: Annual Exam and Hypertension  This visit occurred during the SARS-CoV-2 public health emergency.  Safety protocols were in place, including screening questions prior to the visit, additional usage of staff PPE, and extensive cleaning of exam room while observing appropriate contact time as indicated for disinfecting solutions.    HPI Larry Blair presents for a CPX.  He is not currently taking any antihypertensives. He ran out. He has felt off balance for several weeks and then about 2 weeks ago he describes a fall.  He said he was helping his wife get in and out of a wheelchair when he fell to his right side and he feels like he injured the right side of his neck and his shoulder.  He has not been taking anything for pain.  He has not been seen about this and no x-rays have been completed.  He continues to feel off balance.  He denies headache, blurred vision, chest pain, shortness of breath, DOE, palpitations, edema, or fatigue.  Outpatient Medications Prior to Visit  Medication Sig Dispense Refill  . Cholecalciferol 2000 units TABS Take 1 tablet (2,000 Units total) by mouth daily. (Patient not taking: Reported on 07/12/2019) 90 tablet 1  . fexofenadine (ALLEGRA) 180 MG tablet Take 180 mg by mouth daily.    . Multiple Vitamin (MULTIVITAMIN) tablet Take 1 tablet by mouth daily.    Marland Kitchen atorvastatin (LIPITOR) 10 MG tablet Take 1 tablet by mouth once daily (Patient not taking: Reported on 07/12/2019) 90 tablet 1  . carvedilol (COREG) 6.25 MG tablet TAKE 1 TABLET BY MOUTH TWICE DAILY WITH A MEAL (Patient not taking: Reported on 07/12/2019) 180 tablet 1  . Crisaborole (EUCRISA) 2 % OINT Apply 1 Act topically 2 (two) times daily. (Patient not taking: Reported on 07/12/2019) 60 g 5  . spironolactone (ALDACTONE) 25 MG tablet Take 1 tablet (25 mg total) by mouth daily. (Patient not taking: Reported on 07/12/2019)  90 tablet 3   No facility-administered medications prior to visit.    ROS Review of Systems  Constitutional: Negative.  Negative for diaphoresis, fatigue and unexpected weight change.  HENT: Negative.   Eyes: Negative for visual disturbance.  Respiratory: Negative for cough, chest tightness, shortness of breath and wheezing.   Cardiovascular: Negative for chest pain, palpitations and leg swelling.  Gastrointestinal: Negative for abdominal pain, blood in stool, constipation, diarrhea and nausea.  Endocrine: Negative.   Genitourinary: Negative.  Negative for difficulty urinating, dysuria, scrotal swelling, testicular pain and urgency.  Musculoskeletal: Positive for arthralgias, gait problem and neck pain. Negative for back pain, joint swelling and myalgias.  Skin: Negative.   Neurological: Negative for dizziness, weakness, light-headedness, numbness and headaches.  Hematological: Negative for adenopathy. Does not bruise/bleed easily.  Psychiatric/Behavioral: Negative.     Objective:  BP (!) 166/112 (BP Location: Left Arm, Patient Position: Sitting, Cuff Size: Large)   Temp 98.4 F (36.9 C) (Oral)   Resp 16   Ht 5\' 11"  (1.803 m)   Wt 197 lb (89.4 kg)   SpO2 97%   BMI 27.48 kg/m   BP Readings from Last 3 Encounters:  07/12/19 (!) 166/112  02/09/18 130/88  11/24/17 (!) 140/100    Wt Readings from Last 3 Encounters:  07/12/19 197 lb (89.4 kg)  02/09/18 196 lb (88.9 kg)  11/24/17 197 lb 4 oz (89.5 kg)    Physical Exam Vitals reviewed.  Constitutional:      Appearance: Normal appearance. He is not ill-appearing.  HENT:     Nose: Nose normal.     Mouth/Throat:     Mouth: Mucous membranes are moist.  Eyes:     General: No scleral icterus.    Extraocular Movements: Extraocular movements intact.     Conjunctiva/sclera: Conjunctivae normal.     Pupils: Pupils are equal, round, and reactive to light.  Cardiovascular:     Rate and Rhythm: Normal rate and regular rhythm.      Pulses: Normal pulses.     Heart sounds: No murmur. No gallop.      Comments: EKG - NSR, 84 bpm No LVH Normal EKG Pulmonary:     Effort: Pulmonary effort is normal.     Breath sounds: No stridor. No wheezing, rhonchi or rales.  Abdominal:     General: Abdomen is flat. Bowel sounds are normal. There is no distension.     Palpations: Abdomen is soft. There is no hepatomegaly, splenomegaly or mass.     Tenderness: There is no abdominal tenderness.     Hernia: There is no hernia in the left inguinal area.  Genitourinary:    Pubic Area: No rash.      Penis: Normal and circumcised. No discharge, swelling or lesions.      Testes: Normal.        Right: Mass, tenderness or swelling not present.        Left: Mass, tenderness or swelling not present.     Epididymis:     Right: Normal. Not inflamed or enlarged. No mass or tenderness.     Left: Not inflamed or enlarged. No mass or tenderness.  Musculoskeletal:        General: Normal range of motion.     Right shoulder: Normal. No swelling or deformity.     Left shoulder: Normal. No swelling or deformity.     Cervical back: Neck supple. No tenderness.     Right lower leg: No edema.     Left lower leg: No edema.  Lymphadenopathy:     Cervical: No cervical adenopathy.     Lower Body: No right inguinal adenopathy. No left inguinal adenopathy.  Skin:    General: Skin is warm and dry.  Neurological:     General: No focal deficit present.     Mental Status: He is alert and oriented to person, place, and time. Mental status is at baseline.     Cranial Nerves: Cranial nerves are intact. No cranial nerve deficit.     Motor: Motor function is intact. No weakness, tremor, atrophy or abnormal muscle tone.     Coordination: Romberg sign positive. Coordination abnormal.     Deep Tendon Reflexes: Reflexes normal. Babinski sign absent on the right side. Babinski sign absent on the left side.     Reflex Scores:      Tricep reflexes are 0 on the right  side and 0 on the left side.      Bicep reflexes are 0 on the right side and 0 on the left side.      Brachioradialis reflexes are 0 on the right side and 0 on the left side.      Patellar reflexes are 0 on the right side and 0 on the left side.      Achilles reflexes are 0 on the right side and 0 on the left side.    Comments: + mild ataxia  Psychiatric:  Mood and Affect: Mood normal.        Behavior: Behavior normal.        Thought Content: Thought content normal.        Judgment: Judgment normal.     Lab Results  Component Value Date   WBC 6.5 07/12/2019   HGB 14.3 07/12/2019   HCT 43.0 07/12/2019   PLT 216.0 07/12/2019   GLUCOSE 97 07/12/2019   CHOL 193 07/12/2019   TRIG 81.0 07/12/2019   HDL 71.60 07/12/2019   LDLCALC 105 (H) 07/12/2019   ALT 23 07/12/2019   AST 20 07/12/2019   NA 140 07/12/2019   K 3.5 07/12/2019   CL 104 07/12/2019   CREATININE 0.76 07/12/2019   BUN 13 07/12/2019   CO2 27 07/12/2019   TSH 2.85 07/12/2019   PSA 1.47 07/12/2019    No results found.  Assessment & Plan:   Jossue was seen today for annual exam and hypertension.  Diagnoses and all orders for this visit:  Essential hypertension- His blood pressure is too high.  Will restart antihypertensives with carvedilol and indapamide. -     CBC with Differential/Platelet; Future -     Basic metabolic panel; Future -     TSH; Future -     Cancel: Urine drugs of abuse scrn w alc, routine (Ref Lab); Future -     Urinalysis, Routine w reflex microscopic; Future -     EKG 12-Lead -     carvedilol (COREG) 6.25 MG tablet; Take 1 tablet (6.25 mg total) by mouth 2 (two) times daily with a meal. -     indapamide (LOZOL) 1.25 MG tablet; Take 1 tablet (1.25 mg total) by mouth daily. -     Urinalysis, Routine w reflex microscopic -     TSH -     Basic metabolic panel -     CBC with Differential/Platelet  Vitamin D deficiency -     VITAMIN D 25 Hydroxy (Vit-D Deficiency, Fractures); Future -      VITAMIN D 25 Hydroxy (Vit-D Deficiency, Fractures)  Hyperlipidemia LDL goal <70- I have asked him to take a statin for CV risk reduction. -     Lipid panel; Future -     Hepatic function panel; Future -     atorvastatin (LIPITOR) 10 MG tablet; Take 1 tablet (10 mg total) by mouth daily. -     Hepatic function panel -     Lipid panel  Routine general medical examination at a health care facility- Exam completed, labs reviewed, he did not receive a pneumonia vaccine today because he is in the process of being vaccinated against COVID-19, cancer screenings are up-to-date, patient education material was given. -     PSA; Future -     PSA  Acute neck pain- Will check a plain film to see if there has been a fracture. -     DG Cervical Spine Complete; Future  Acute shoulder pain due to trauma, right- Will check plain films to see if there is been a fracture. -     DG Shoulder Right; Future  Ataxia- I have asked him to undergo an MRI to see if there is evidence of CVA, tumor, mass, NPH, or small vessel disease. -     MR Brain Wo Contrast; Future  Cerebellar ataxia in diseases classified elsewhere [facility]) -     MR Brain Wo Contrast; Future   I have discontinued Martice Meanor's Crisaborole and spironolactone. I have also  changed his carvedilol and atorvastatin. Additionally, I am having him start on indapamide. Lastly, I am having him maintain his Cholecalciferol, multivitamin, and fexofenadine.  Meds ordered this encounter  Medications  . carvedilol (COREG) 6.25 MG tablet    Sig: Take 1 tablet (6.25 mg total) by mouth 2 (two) times daily with a meal.    Dispense:  180 tablet    Refill:  1  . atorvastatin (LIPITOR) 10 MG tablet    Sig: Take 1 tablet (10 mg total) by mouth daily.    Dispense:  90 tablet    Refill:  1  . indapamide (LOZOL) 1.25 MG tablet    Sig: Take 1 tablet (1.25 mg total) by mouth daily.    Dispense:  90 tablet    Refill:  0     Follow-up: Return in about 6  weeks (around 08/23/2019).  Sanda Linger, MD

## 2019-07-12 NOTE — Patient Instructions (Signed)

## 2019-07-13 ENCOUNTER — Encounter: Payer: Self-pay | Admitting: Internal Medicine

## 2019-07-13 LAB — URINALYSIS, ROUTINE W REFLEX MICROSCOPIC
Bilirubin Urine: NEGATIVE
Hgb urine dipstick: NEGATIVE
Ketones, ur: NEGATIVE
Leukocytes,Ua: NEGATIVE
Nitrite: NEGATIVE
Specific Gravity, Urine: 1.025 (ref 1.000–1.030)
Total Protein, Urine: NEGATIVE
Urine Glucose: NEGATIVE
Urobilinogen, UA: 0.2 (ref 0.0–1.0)
pH: 5.5 (ref 5.0–8.0)

## 2019-08-09 ENCOUNTER — Ambulatory Visit
Admission: RE | Admit: 2019-08-09 | Discharge: 2019-08-09 | Disposition: A | Discharge status: Home / Self Care | Payer: Self-pay | Source: Ambulatory Visit | Attending: Internal Medicine | Admitting: Internal Medicine

## 2019-08-09 ENCOUNTER — Other Ambulatory Visit: Payer: Self-pay

## 2019-08-09 DIAGNOSIS — G3281 Cerebellar ataxia in diseases classified elsewhere: Secondary | ICD-10-CM

## 2019-08-09 DIAGNOSIS — R270 Ataxia, unspecified: Secondary | ICD-10-CM

## 2019-08-10 ENCOUNTER — Other Ambulatory Visit: Payer: Self-pay | Admitting: Internal Medicine

## 2019-08-10 DIAGNOSIS — J3489 Other specified disorders of nose and nasal sinuses: Secondary | ICD-10-CM | POA: Insufficient documentation

## 2019-08-10 DIAGNOSIS — R930 Abnormal findings on diagnostic imaging of skull and head, not elsewhere classified: Secondary | ICD-10-CM

## 2019-08-23 ENCOUNTER — Other Ambulatory Visit: Payer: Self-pay

## 2019-08-23 ENCOUNTER — Encounter: Payer: Self-pay | Admitting: Internal Medicine

## 2019-08-23 ENCOUNTER — Ambulatory Visit (INDEPENDENT_AMBULATORY_CARE_PROVIDER_SITE_OTHER): Payer: Self-pay | Admitting: Internal Medicine

## 2019-08-23 VITALS — BP 152/96 | HR 83 | Temp 98.0°F | Resp 16 | Ht 71.0 in | Wt 188.0 lb

## 2019-08-23 DIAGNOSIS — Z23 Encounter for immunization: Secondary | ICD-10-CM

## 2019-08-23 DIAGNOSIS — I10 Essential (primary) hypertension: Secondary | ICD-10-CM

## 2019-08-23 DIAGNOSIS — R270 Ataxia, unspecified: Secondary | ICD-10-CM

## 2019-08-23 DIAGNOSIS — R3129 Other microscopic hematuria: Secondary | ICD-10-CM

## 2019-08-23 MED ORDER — EDARBI 40 MG PO TABS: 1.0000 | ORAL_TABLET | Freq: Every day | ORAL | 0 refills | Status: DC

## 2019-08-23 NOTE — Patient Instructions (Signed)

## 2019-08-23 NOTE — Progress Notes (Signed)
Subjective:  Patient ID: Larry Blair, male    DOB: 24-Mar-1956  Age: 64 y.o. MRN: [id number]  CC: Hypertension  This visit occurred during the SARS-CoV-2 public health emergency.  Safety protocols were in place, including screening questions prior to the visit, additional usage of staff PPE, and extensive cleaning of exam room while observing appropriate contact time as indicated for disinfecting solutions.   HPI Larry Blair presents for f/up - He sustained a fall about 2 months ago and has had a steady neurological decline since then. His recent MRI only showed small vessel disease.  He complains of a gradual decline in his ability to walk and feeling off balanced.  This has gradually worsened over the 2 months.  He has lost dexterity in the fine motor activity in his hands.  He tells me he has diffuse weakness, numbness, and tingling but much more prominent on the right than the left.  Outpatient Medications Prior to Visit  Medication Sig Dispense Refill  . atorvastatin (LIPITOR) 10 MG tablet Take 1 tablet (10 mg total) by mouth daily. 90 tablet 1  . carvedilol (COREG) 6.25 MG tablet Take 1 tablet (6.25 mg total) by mouth 2 (two) times daily with a meal. 180 tablet 1  . fexofenadine (ALLEGRA) 180 MG tablet Take 180 mg by mouth daily.    . indapamide (LOZOL) 1.25 MG tablet Take 1 tablet (1.25 mg total) by mouth daily. 90 tablet 0  . Multiple Vitamin (MULTIVITAMIN) tablet Take 1 tablet by mouth daily.    . Cholecalciferol 2000 units TABS Take 1 tablet (2,000 Units total) by mouth daily. (Patient not taking: Reported on 08/23/2019) 90 tablet 1   No facility-administered medications prior to visit.    ROS Review of Systems  Constitutional: Positive for fatigue. Negative for chills and diaphoresis.  HENT: Negative.  Negative for trouble swallowing.   Eyes: Negative.  Negative for visual disturbance.  Respiratory: Negative.  Negative for cough, chest tightness, shortness of breath and  wheezing.   Cardiovascular: Negative for chest pain, palpitations and leg swelling.  Gastrointestinal: Negative for abdominal pain, constipation, diarrhea, nausea and vomiting.  Endocrine: Negative.   Genitourinary: Positive for hematuria and urgency. Negative for decreased urine volume, difficulty urinating, dysuria and frequency.       He has noticed a couple of episodes of painless hematuria over the last few weeks.  Musculoskeletal: Positive for arthralgias, gait problem and neck pain. Negative for back pain and myalgias.  Skin: Negative.   Allergic/Immunologic: Negative.   Neurological: Positive for weakness and numbness. Negative for dizziness, tremors, facial asymmetry, light-headedness and headaches.  Hematological: Negative for adenopathy. Does not bruise/bleed easily.  Psychiatric/Behavioral: Negative.     Objective:  BP (!) 152/96 (BP Location: Left Arm, Patient Position: Sitting, Cuff Size: Large)   Pulse 83   Temp 98 F (36.7 C) (Oral)   Resp 16   Ht 5\' 11"  (1.803 m)   Wt 188 lb (85.3 kg)   SpO2 94%   BMI 26.22 kg/m   BP Readings from Last 3 Encounters:  08/23/19 (!) 152/96  07/12/19 (!) 166/112  02/09/18 130/88    Wt Readings from Last 3 Encounters:  08/23/19 188 lb (85.3 kg)  07/12/19 197 lb (89.4 kg)  02/09/18 196 lb (88.9 kg)    Physical Exam Vitals reviewed.  Constitutional:      General: He is not in acute distress.    Appearance: He is ill-appearing (in a wheelchair today). He is not toxic-appearing.  HENT:     Nose: Nose normal.     Mouth/Throat:     Mouth: Mucous membranes are moist.     Pharynx: Oropharynx is clear.  Eyes:     General: No scleral icterus.    Conjunctiva/sclera: Conjunctivae normal.  Cardiovascular:     Rate and Rhythm: Normal rate and regular rhythm.     Heart sounds: No murmur.  Pulmonary:     Effort: Pulmonary effort is normal.     Breath sounds: No stridor. No wheezing, rhonchi or rales.  Abdominal:     General:  Abdomen is flat. Bowel sounds are normal.     Palpations: There is no hepatomegaly, splenomegaly or mass.     Tenderness: There is no abdominal tenderness.  Musculoskeletal:        General: Normal range of motion.     Cervical back: Neck supple.     Right lower leg: No edema.     Left lower leg: No edema.  Lymphadenopathy:     Cervical: No cervical adenopathy.  Skin:    General: Skin is warm and dry.     Coloration: Skin is not pale.  Neurological:     Mental Status: He is alert and oriented to person, place, and time.     Cranial Nerves: No cranial nerve deficit.     Sensory: No sensory deficit.     Motor: Weakness present.     Coordination: Romberg sign positive. Coordination abnormal. Finger-Nose-Finger Test abnormal and Heel to [facility] Test abnormal.     Gait: Gait abnormal and tandem walk abnormal.     Deep Tendon Reflexes: Reflexes normal.     Reflex Scores:      Tricep reflexes are 1+ on the right side and 0 on the left side.      Bicep reflexes are 1+ on the right side and 0 on the left side.      Brachioradialis reflexes are 1+ on the right side and 1+ on the left side.      Patellar reflexes are 1+ on the right side and 1+ on the left side.      Achilles reflexes are 0 on the right side and 0 on the left side.    Comments: Mild weakness in RLE and LUE Wide-based ataxic gait     Lab Results  Component Value Date   WBC 6.5 07/12/2019   HGB 14.3 07/12/2019   HCT 43.0 07/12/2019   PLT 216.0 07/12/2019   GLUCOSE 97 07/12/2019   CHOL 193 07/12/2019   TRIG 81.0 07/12/2019   HDL 71.60 07/12/2019   LDLCALC 105 (H) 07/12/2019   ALT 23 07/12/2019   AST 20 07/12/2019   NA 140 07/12/2019   K 3.5 07/12/2019   CL 104 07/12/2019   CREATININE 0.76 07/12/2019   BUN 13 07/12/2019   CO2 27 07/12/2019   TSH 2.85 07/12/2019   PSA 1.47 07/12/2019    MR Brain Wo Contrast  Result Date: 08/10/2019 CLINICAL DATA:  Numbness in the right shoulder with weakness in the hands. EXAM:  MRI HEAD WITHOUT CONTRAST TECHNIQUE: Multiplanar, multiecho pulse sequences of the brain and surrounding structures were obtained without intravenous contrast. COMPARISON:  None. FINDINGS: Brain: No acute or subacute infarction, hemorrhage, hydrocephalus, extra-axial collection or mass lesion. Mild FLAIR hyperintensity in the cerebral white matter with patchy nonspecific appearance. Brain volume is normal Vascular: Normal flow voids Skull and upper cervical spine: It is normal marrow signal Sinuses/Orbits: Ovoid heterogeneous signal the level  of the sphenoid inter sinus septum or left sphenoid sinus which measures 2.5 x 1.8 cm. IMPRESSION: 1. No specific cause of symptoms and no reversible finding in the brain. 2. Mild white matter disease usually related to chronic small vessel ischemia. No typical demyelinating pattern. 3. 2.5 x 1.8 cm sphenoid sinus abnormality which could be intersinus septal bone lesion (such as fibrous dysplasia), soft tissue mass, or trapped debris. Postcontrast imaging may be required, but first recommend sinus CT. Electronically Signed   By: Marnee Spring M.D.   On: 08/10/2019 05:48    Assessment & Plan:   [place] was seen today for hypertension.  Diagnoses and all orders for this visit:  Ataxia- I do not have a good explanation for what is causing his symptoms.  I have asked him to see neurology about this. -     Ambulatory referral to Neurology  Essential hypertension- His blood pressure is not adequately well controlled.  I have asked him to add an ARB to the current regimen. -     Azilsartan Medoxomil (EDARBI) 40 MG TABS; Take 1 tablet by mouth daily.  Other microscopic hematuria- His urinalysis is negative for red blood cells.  There are no other abnormal elements either.  Will check a urine culture to screen for infection.  If this recurs then we will consider doing a CT scan of his kidneys and bladder. -     CULTURE, URINE COMPREHENSIVE; Future -     Urinalysis,  Routine w reflex microscopic; Future -     Urinalysis, Routine w reflex microscopic -     CULTURE, URINE COMPREHENSIVE  Need for pneumococcal vaccination -     Pneumococcal conjugate vaccine 13-valent   I am having [name] start on Edarbi. I am also having him maintain his Cholecalciferol, multivitamin, fexofenadine, carvedilol, atorvastatin, and indapamide.  Meds ordered this encounter  Medications  . Azilsartan Medoxomil (EDARBI) 40 MG TABS    Sig: Take 1 tablet by mouth daily.    Dispense:  56 tablet    Refill:  0     Follow-up: Return in about 6 weeks (around 10/04/2019).  Sanda Linger, MD

## 2019-08-24 LAB — URINALYSIS, ROUTINE W REFLEX MICROSCOPIC
Bilirubin Urine: NEGATIVE
Hgb urine dipstick: NEGATIVE
Ketones, ur: NEGATIVE
Leukocytes,Ua: NEGATIVE
Nitrite: NEGATIVE
RBC / HPF: NONE SEEN (ref 0–?)
Specific Gravity, Urine: 1.025 (ref 1.000–1.030)
Total Protein, Urine: NEGATIVE
Urine Glucose: NEGATIVE
Urobilinogen, UA: 0.2 (ref 0.0–1.0)
pH: 6 (ref 5.0–8.0)

## 2019-08-24 NOTE — Progress Notes (Addendum)
Assessment/Plan:   1.  Cervical myelopathy  -Patient with evidence of cervical myelopathy.  He has ataxia, hand and leg weakness, right much more so than left.  He has urinary dysfunction.  Given that, this is urgent/emergent.  I encouraged him to go to the emergency room today to get an MRI of the cervical spine.  However, he has to take care of his wife who is in a wheelchair and really does not wish to do this.  Therefore, I had him sit in the room today and called around and was able to get him a stat MRI of the cervical spine at [facility] at 2 PM today.  I did tell him that should he have any complete loss of bladder or bowel control that he should go to the emergency room immediately.  Both patient and son expressed concern and gracious notes for the care that was taken today.  If above neg, we will do emg Addendum:  Got results of MRI cervical spine.  Reviewed MRI personally.  Cord edema at C3-4.  Spoke with the radiologist personally.  Called neurosurgery and spoke with Dr. Venetia Maxon who reviewed the films, who also spoke with Dr. Yetta Barre (on call neurosurgeon who was also in the room).  They asked me to send the patient to their office immediately.  I subsequently called the patient, who was still in radiology.  I talked to the patient, gave them the neurosurgeons address and phone number and he repeated them back to me. Subjective:   Larry Blair was seen today in neurologic consultation at the request of Etta Grandchild, MD.  The consultation is for the evaluation of ataxia.  Medical records made available to me are reviewed.  Patient was seen by primary care in April for complete physical exam.  Notes at that examination indicate that the patient felt off balance for several weeks and then about 2 weeks prior to the examination he had a fall.  Pt states, however, that he had no trouble before the fall and that is when all his problems started.    He was helping his wife get out of her wheelchair when  he fell to the right side (fell because feet got tangled up on the WC foot) and he fell on his face.  No LOC.   felt that he injured his neck and shoulder.  He got up.  He noted a week later that his R leg especially isn't working right.  The entire R arm is numb and he is losing strength in the R hand and dexterity in the R hand.  He is dropping objects.  He has a little numbness of the L arm.  He is noting much more urinary urgency than in the past, so much so that he cannot hold it and may pee on himself.  He is constipated, which is a bit unusual but no loss of bowel control but wonders if that is b/c he isn't eating like he was.  No peri-anal paresthesias.  No other falls.  He has been using a cane since the event.  he is swallowing okay.     MRI brain was ordered and completed on Aug 09, 2019.  I had the opportunity to review that.  Intracranially, there was mild small vessel disease.  There was also noted to be a 2.5 x 1.8 cm sphenoid sinus abnormality, and CT maxillofacial was recommended.  X-ray of the cervical spine was completed on July 13, 2019 which  demonstrated moderate degenerative disc disease, most pronounced at C5-C6.  Patient followed back up with primary care on Aug 23, 2019.  Patient noted loss of dexterity and fine motor activity in the hands.   ALLERGIES:   Allergies  Allergen Reactions  . Sulfur     CURRENT MEDICATIONS:  Outpatient Encounter Medications as of 08/26/2019  Medication Sig  . atorvastatin (LIPITOR) 10 MG tablet Take 1 tablet (10 mg total) by mouth daily.  . Azilsartan Medoxomil (EDARBI) 40 MG TABS Take 1 tablet by mouth daily.  . carvedilol (COREG) 6.25 MG tablet Take 1 tablet (6.25 mg total) by mouth 2 (two) times daily with a meal.  . fexofenadine (ALLEGRA) 180 MG tablet Take 180 mg by mouth daily.  . indapamide (LOZOL) 1.25 MG tablet Take 1 tablet (1.25 mg total) by mouth daily.  . Multiple Vitamin (MULTIVITAMIN) tablet Take 1 tablet by mouth daily.  .  Cholecalciferol 2000 units TABS Take 1 tablet (2,000 Units total) by mouth daily. (Patient not taking: Reported on 08/23/2019)   No facility-administered encounter medications on file as of 08/26/2019.    Objective:   PHYSICAL EXAMINATION:    VITALS:   Vitals:   08/26/19 0857  BP: 131/87  Pulse: 79  SpO2: 97%  Weight: 181 lb (82.1 kg)  Height: 5\' 11"  (1.803 m)    GEN:  Normal appears male in no acute distress.  Appears stated age. HEENT:  Normocephalic, atraumatic. The mucous membranes are moist. The superficial temporal arteries are without ropiness or tenderness. Cardiovascular: Regular rate and rhythm. Lungs: Clear to auscultation bilaterally. Neck/Heme: There are no carotid bruits noted bilaterally.  NEUROLOGICAL: Orientation:  The patient is alert and oriented x 3.   Cranial nerves: There is good facial symmetry.  Extraocular muscles are intact and visual fields are full to confrontational testing. Speech is fluent and clear. Soft palate rises symmetrically and there is no tongue deviation. Hearing is intact to conversational tone. Tone: Tone is good throughout. Sensation: Sensation is intact to light touch and pinprick throughout (facial, trunk, extremities). Vibration is intact at the bilateral big toe. There is no extinction with double simultaneous stimulation. There is no sensory dermatomal level identified. Coordination:  The patient has no difficulty with RAM's or FNF bilaterally. Motor: Strength is 5/5 in the left upper extremity.  There is good grip strength on the right.  There is good biceps strength, triceps strength, deltoid strength on the right.  He has significant decreased strength in the hands on the right, particularly in the interossei.  There are just a few fasciculations over the right triceps, but that was the only place (patient was undressed for the examination).  Patient strength in the proximal musculature of the right leg was 3+/5 and 4+-5-/5 in the  distal right leg.  Strength in the left leg was 5 -/5.   DTR's: Deep tendon reflexes are 2-/4 at the bilateral biceps, triceps, brachioradialis, patella and achilles.  Plantar responses are downgoing bilaterally. Gait and Station: The patient needed help to arise.  He was very ataxic and off balance.  He was wide-based.    Total time spent on today's visit was 100 minutes, including both face-to-face time and nonface-to-face time.  Time included that spent on review of records (prior notes available to me/labs/imaging if pertinent), discussing treatment and goals, answering patient's questions and coordinating care.   Cc:  Janith Lima, MD

## 2019-08-25 ENCOUNTER — Other Ambulatory Visit: Payer: Self-pay

## 2019-08-25 ENCOUNTER — Other Ambulatory Visit: Payer: Self-pay | Admitting: Internal Medicine

## 2019-08-25 ENCOUNTER — Ambulatory Visit
Admission: RE | Admit: 2019-08-25 | Discharge: 2019-08-25 | Disposition: A | Discharge status: Home / Self Care | Payer: Self-pay | Source: Ambulatory Visit | Attending: Internal Medicine | Admitting: Internal Medicine

## 2019-08-25 DIAGNOSIS — J3489 Other specified disorders of nose and nasal sinuses: Secondary | ICD-10-CM

## 2019-08-25 DIAGNOSIS — R930 Abnormal findings on diagnostic imaging of skull and head, not elsewhere classified: Secondary | ICD-10-CM

## 2019-08-25 LAB — CULTURE, URINE COMPREHENSIVE: RESULT:: NO GROWTH

## 2019-08-25 MED ORDER — IOPAMIDOL (ISOVUE-300) INJECTION 61%
75.0000 mL | Freq: Once | INTRAVENOUS | Status: AC | PRN
  Administered 2019-08-25: 75 mL via INTRAVENOUS

## 2019-08-26 ENCOUNTER — Ambulatory Visit (INDEPENDENT_AMBULATORY_CARE_PROVIDER_SITE_OTHER): Payer: Self-pay | Admitting: Neurology

## 2019-08-26 ENCOUNTER — Encounter: Payer: Self-pay | Admitting: Neurology

## 2019-08-26 ENCOUNTER — Other Ambulatory Visit: Payer: Self-pay | Admitting: Neurological Surgery

## 2019-08-26 ENCOUNTER — Other Ambulatory Visit: Payer: Self-pay

## 2019-08-26 ENCOUNTER — Ambulatory Visit (HOSPITAL_COMMUNITY)
Admission: RE | Admit: 2019-08-26 | Discharge: 2019-08-26 | Disposition: A | Discharge status: Home / Self Care | Payer: Self-pay | Source: Ambulatory Visit | Attending: Neurology | Admitting: Neurology

## 2019-08-26 VITALS — BP 131/87 | HR 79 | Ht 71.0 in | Wt 181.0 lb

## 2019-08-26 DIAGNOSIS — G959 Disease of spinal cord, unspecified: Secondary | ICD-10-CM

## 2019-08-26 DIAGNOSIS — G729 Myopathy, unspecified: Secondary | ICD-10-CM

## 2019-08-26 NOTE — Addendum Note (Signed)
Addended by: Kandice Robinsons T on: 08/26/2019 12:56 PM   Modules accepted: Orders

## 2019-08-26 NOTE — Patient Instructions (Signed)
Your MRI has been scheduled for today at [facility] at 2pm you must arrive no later than 1:25p.

## 2019-08-27 ENCOUNTER — Other Ambulatory Visit: Payer: Self-pay

## 2019-08-27 ENCOUNTER — Encounter (HOSPITAL_COMMUNITY): Payer: Self-pay | Admitting: Neurological Surgery

## 2019-08-27 NOTE — Progress Notes (Signed)
[name] denies chest pain or shortness of breath.Mr Larry Blair denies s/s of Covid, he reports that he is scheduled for Covid test Saturday at 1000. I instructed patient to not take Ibuprofen until instructed that it is ok  after surgery.  Mr Larry Blair smokes Larry Blair several times a day, I instructed patient to not smoke within 24 hours prior to surgery.

## 2019-08-28 ENCOUNTER — Other Ambulatory Visit (HOSPITAL_COMMUNITY)
Admission: RE | Admit: 2019-08-28 | Discharge: 2019-08-28 | Disposition: A | Discharge status: Home / Self Care | Payer: HRSA Program | Source: Ambulatory Visit | Attending: Neurological Surgery | Admitting: Neurological Surgery

## 2019-08-28 DIAGNOSIS — Z20822 Contact with and (suspected) exposure to covid-19: Secondary | ICD-10-CM | POA: Insufficient documentation

## 2019-08-28 DIAGNOSIS — Z01812 Encounter for preprocedural laboratory examination: Secondary | ICD-10-CM | POA: Diagnosis present

## 2019-08-28 LAB — SARS CORONAVIRUS 2 (TAT 6-24 HRS): SARS Coronavirus 2: NEGATIVE

## 2019-08-30 ENCOUNTER — Other Ambulatory Visit: Payer: Self-pay

## 2019-08-30 ENCOUNTER — Encounter (HOSPITAL_COMMUNITY): Payer: Self-pay | Admitting: Neurological Surgery

## 2019-08-30 ENCOUNTER — Ambulatory Visit (HOSPITAL_COMMUNITY): Payer: Self-pay | Admitting: Anesthesiology

## 2019-08-30 ENCOUNTER — Observation Stay (HOSPITAL_COMMUNITY)
Admission: RE | Admit: 2019-08-30 | Discharge: 2019-08-31 | Disposition: A | Discharge status: Home / Self Care | Payer: Self-pay | Attending: Neurological Surgery | Admitting: Neurological Surgery

## 2019-08-30 ENCOUNTER — Ambulatory Visit (HOSPITAL_COMMUNITY): Payer: Self-pay

## 2019-08-30 ENCOUNTER — Encounter (HOSPITAL_COMMUNITY)
Admission: RE | Disposition: A | Discharge status: Home / Self Care | Payer: Self-pay | Source: Home / Self Care | Attending: Neurological Surgery

## 2019-08-30 DIAGNOSIS — Z419 Encounter for procedure for purposes other than remedying health state, unspecified: Secondary | ICD-10-CM

## 2019-08-30 DIAGNOSIS — Z981 Arthrodesis status: Secondary | ICD-10-CM

## 2019-08-30 DIAGNOSIS — I10 Essential (primary) hypertension: Secondary | ICD-10-CM | POA: Insufficient documentation

## 2019-08-30 DIAGNOSIS — M19011 Primary osteoarthritis, right shoulder: Secondary | ICD-10-CM | POA: Insufficient documentation

## 2019-08-30 DIAGNOSIS — Z87891 Personal history of nicotine dependence: Secondary | ICD-10-CM | POA: Insufficient documentation

## 2019-08-30 DIAGNOSIS — E785 Hyperlipidemia, unspecified: Secondary | ICD-10-CM | POA: Insufficient documentation

## 2019-08-30 DIAGNOSIS — G959 Disease of spinal cord, unspecified: Secondary | ICD-10-CM

## 2019-08-30 DIAGNOSIS — J45909 Unspecified asthma, uncomplicated: Secondary | ICD-10-CM | POA: Insufficient documentation

## 2019-08-30 DIAGNOSIS — M4802 Spinal stenosis, cervical region: Principal | ICD-10-CM | POA: Insufficient documentation

## 2019-08-30 DIAGNOSIS — M4712 Other spondylosis with myelopathy, cervical region: Secondary | ICD-10-CM | POA: Insufficient documentation

## 2019-08-30 DIAGNOSIS — R269 Unspecified abnormalities of gait and mobility: Secondary | ICD-10-CM | POA: Insufficient documentation

## 2019-08-30 DIAGNOSIS — Z79899 Other long term (current) drug therapy: Secondary | ICD-10-CM | POA: Insufficient documentation

## 2019-08-30 HISTORY — DX: Unspecified osteoarthritis, unspecified site: M19.90

## 2019-08-30 HISTORY — PX: ANTERIOR CERVICAL DECOMP/DISCECTOMY FUSION: SHX1161

## 2019-08-30 LAB — BASIC METABOLIC PANEL
Anion gap: 10 (ref 5–15)
BUN: 14 mg/dL (ref 8–23)
CO2: 29 mmol/L (ref 22–32)
Calcium: 9.5 mg/dL (ref 8.9–10.3)
Chloride: 101 mmol/L (ref 98–111)
Creatinine, Ser: 0.7 mg/dL (ref 0.61–1.24)
GFR calc Af Amer: >60 mL/min (ref >60)
GFR calc non Af Amer: >60 mL/min (ref >60)
Glucose, Bld: 89 mg/dL (ref 70–99)
Potassium: 3.1 mmol/L — ABNORMAL LOW (ref 3.5–5.1)
Sodium: 140 mmol/L (ref 135–145)

## 2019-08-30 LAB — CBC WITH DIFFERENTIAL/PLATELET
Abs Immature Granulocytes: 0.03 10*3/uL (ref 0.00–0.07)
Basophils Absolute: 0 10*3/uL (ref 0.0–0.1)
Basophils Relative: 0 %
Eosinophils Absolute: 0 10*3/uL (ref 0.0–0.5)
Eosinophils Relative: 0 %
HCT: 42.4 % (ref 39.0–52.0)
Hemoglobin: 13.7 g/dL (ref 13.0–17.0)
Immature Granulocytes: 0 %
Lymphocytes Relative: 20 %
Lymphs Abs: 1.6 10*3/uL (ref 0.7–4.0)
MCH: 32.2 pg (ref 26.0–34.0)
MCHC: 32.3 g/dL (ref 30.0–36.0)
MCV: 99.8 fL (ref 80.0–100.0)
Monocytes Absolute: 0.5 10*3/uL (ref 0.1–1.0)
Monocytes Relative: 6 %
Neutro Abs: 6.1 10*3/uL (ref 1.7–7.7)
Neutrophils Relative %: 74 %
Platelets: 208 10*3/uL (ref 150–400)
RBC: 4.25 MIL/uL (ref 4.22–5.81)
RDW: 11.6 % (ref 11.5–15.5)
WBC: 8.3 10*3/uL (ref 4.0–10.5)
nRBC: 0 % (ref 0.0–0.2)

## 2019-08-30 LAB — PROTIME-INR
INR: 1 (ref 0.8–1.2)
Prothrombin Time: 12.6 seconds (ref 11.4–15.2)

## 2019-08-30 LAB — SURGICAL PCR SCREEN
MRSA, PCR: NEGATIVE
Staphylococcus aureus: NEGATIVE

## 2019-08-30 SURGERY — ANTERIOR CERVICAL DECOMPRESSION/DISCECTOMY FUSION 1 LEVEL
Anesthesia: General | Site: Spine Cervical

## 2019-08-30 MED ORDER — SUCCINYLCHOLINE CHLORIDE 200 MG/10ML IV SOSY
PREFILLED_SYRINGE | INTRAVENOUS | Status: AC
  Filled 2019-08-30: qty 10

## 2019-08-30 MED ORDER — CHLORHEXIDINE GLUCONATE CLOTH 2 % EX PADS: 6.0000 | MEDICATED_PAD | Freq: Once | CUTANEOUS | Status: DC

## 2019-08-30 MED ORDER — ALBUTEROL SULFATE HFA 108 (90 BASE) MCG/ACT IN AERS
INHALATION_SPRAY | RESPIRATORY_TRACT | Status: AC
  Filled 2019-08-30: qty 6.7

## 2019-08-30 MED ORDER — BUPIVACAINE HCL (PF) 0.25 % IJ SOLN
INTRAMUSCULAR | Status: AC
  Filled 2019-08-30: qty 30

## 2019-08-30 MED ORDER — SODIUM CHLORIDE 0.9 % IV SOLN
250.0000 mL | INTRAVENOUS | Status: DC
  Administered 2019-08-30: 250 mL via INTRAVENOUS

## 2019-08-30 MED ORDER — CELECOXIB 200 MG PO CAPS
ORAL_CAPSULE | ORAL | Status: AC
  Administered 2019-08-30: 200 mg via ORAL
  Filled 2019-08-30: qty 1

## 2019-08-30 MED ORDER — PHENYLEPHRINE HCL-NACL 10-0.9 MG/250ML-% IV SOLN
INTRAVENOUS | Status: DC | PRN
  Administered 2019-08-30: 25 ug/min via INTRAVENOUS

## 2019-08-30 MED ORDER — SUGAMMADEX SODIUM 200 MG/2ML IV SOLN
INTRAVENOUS | Status: DC | PRN
  Administered 2019-08-30: 160 mg via INTRAVENOUS

## 2019-08-30 MED ORDER — METHOCARBAMOL 500 MG PO TABS
500.0000 mg | ORAL_TABLET | Freq: Four times a day (QID) | ORAL | Status: DC | PRN
  Administered 2019-08-30 – 2019-08-31 (×2): 500 mg via ORAL
  Filled 2019-08-30: qty 1

## 2019-08-30 MED ORDER — DEXAMETHASONE SODIUM PHOSPHATE 10 MG/ML IJ SOLN
INTRAMUSCULAR | Status: AC
  Filled 2019-08-30: qty 1

## 2019-08-30 MED ORDER — SCOPOLAMINE 1 MG/3DAYS TD PT72: 1.0000 | MEDICATED_PATCH | TRANSDERMAL | Status: DC

## 2019-08-30 MED ORDER — PROPOFOL 10 MG/ML IV BOLUS
INTRAVENOUS | Status: DC | PRN
  Administered 2019-08-30: 150 mg via INTRAVENOUS

## 2019-08-30 MED ORDER — SENNA 8.6 MG PO TABS
1.0000 | ORAL_TABLET | Freq: Two times a day (BID) | ORAL | Status: DC
  Administered 2019-08-30: 8.6 mg via ORAL
  Filled 2019-08-30: qty 1

## 2019-08-30 MED ORDER — EPHEDRINE SULFATE-NACL 50-0.9 MG/10ML-% IV SOSY
PREFILLED_SYRINGE | INTRAVENOUS | Status: DC | PRN
  Administered 2019-08-30: 10 mg via INTRAVENOUS

## 2019-08-30 MED ORDER — CELECOXIB 200 MG PO CAPS: 200.0000 mg | ORAL_CAPSULE | Freq: Once | ORAL | Status: AC

## 2019-08-30 MED ORDER — ONDANSETRON HCL 4 MG/2ML IJ SOLN
INTRAMUSCULAR | Status: AC
  Filled 2019-08-30: qty 2

## 2019-08-30 MED ORDER — SODIUM CHLORIDE 0.9 % IV SOLN: INTRAVENOUS | Status: DC | PRN

## 2019-08-30 MED ORDER — ACETAMINOPHEN 650 MG RE SUPP: 650.0000 mg | RECTAL | Status: DC | PRN

## 2019-08-30 MED ORDER — LIDOCAINE 2% (20 MG/ML) 5 ML SYRINGE
INTRAMUSCULAR | Status: AC
  Filled 2019-08-30: qty 5

## 2019-08-30 MED ORDER — POTASSIUM CHLORIDE IN NACL 20-0.9 MEQ/L-% IV SOLN: INTRAVENOUS | Status: DC

## 2019-08-30 MED ORDER — 0.9 % SODIUM CHLORIDE (POUR BTL) OPTIME
TOPICAL | Status: DC | PRN
  Administered 2019-08-30: 1000 mL

## 2019-08-30 MED ORDER — LIDOCAINE 2% (20 MG/ML) 5 ML SYRINGE
INTRAMUSCULAR | Status: DC | PRN
  Administered 2019-08-30: 100 mg via INTRAVENOUS

## 2019-08-30 MED ORDER — MORPHINE SULFATE (PF) 2 MG/ML IV SOLN: 2.0000 mg | INTRAVENOUS | Status: DC | PRN

## 2019-08-30 MED ORDER — EPHEDRINE 5 MG/ML INJ
INTRAVENOUS | Status: AC
  Filled 2019-08-30: qty 10

## 2019-08-30 MED ORDER — PROPOFOL 10 MG/ML IV BOLUS
INTRAVENOUS | Status: AC
  Filled 2019-08-30: qty 20

## 2019-08-30 MED ORDER — DEXAMETHASONE SODIUM PHOSPHATE 10 MG/ML IJ SOLN
10.0000 mg | Freq: Once | INTRAMUSCULAR | Status: AC
  Administered 2019-08-30: 10 mg via INTRAVENOUS
  Filled 2019-08-30: qty 1

## 2019-08-30 MED ORDER — CARVEDILOL 3.125 MG PO TABS: 6.2500 mg | ORAL_TABLET | Freq: Once | ORAL | Status: AC

## 2019-08-30 MED ORDER — ACETAMINOPHEN 500 MG PO TABS: 1000.0000 mg | ORAL_TABLET | Freq: Once | ORAL | Status: AC

## 2019-08-30 MED ORDER — CARVEDILOL 6.25 MG PO TABS
6.2500 mg | ORAL_TABLET | Freq: Two times a day (BID) | ORAL | Status: DC
  Administered 2019-08-30 – 2019-08-31 (×2): 6.25 mg via ORAL
  Filled 2019-08-30 (×2): qty 1

## 2019-08-30 MED ORDER — CEFAZOLIN SODIUM-DEXTROSE 2-4 GM/100ML-% IV SOLN
INTRAVENOUS | Status: AC
  Filled 2019-08-30: qty 100

## 2019-08-30 MED ORDER — PROMETHAZINE HCL 25 MG/ML IJ SOLN: 6.2500 mg | INTRAMUSCULAR | Status: DC | PRN

## 2019-08-30 MED ORDER — FENTANYL CITRATE (PF) 100 MCG/2ML IJ SOLN
INTRAMUSCULAR | Status: DC | PRN
  Administered 2019-08-30: 50 ug via INTRAVENOUS
  Administered 2019-08-30: 25 ug via INTRAVENOUS
  Administered 2019-08-30: 50 ug via INTRAVENOUS

## 2019-08-30 MED ORDER — CHLORHEXIDINE GLUCONATE 0.12 % MT SOLN
OROMUCOSAL | Status: AC
  Administered 2019-08-30: 15 mL via OROMUCOSAL
  Filled 2019-08-30: qty 15

## 2019-08-30 MED ORDER — CARVEDILOL 3.125 MG PO TABS
ORAL_TABLET | ORAL | Status: AC
  Administered 2019-08-30: 6.25 mg via ORAL
  Filled 2019-08-30: qty 2

## 2019-08-30 MED ORDER — MIDAZOLAM HCL 2 MG/2ML IJ SOLN
INTRAMUSCULAR | Status: AC
  Filled 2019-08-30: qty 2

## 2019-08-30 MED ORDER — CHLORHEXIDINE GLUCONATE 0.12 % MT SOLN: 15.0000 mL | Freq: Once | OROMUCOSAL | Status: AC

## 2019-08-30 MED ORDER — SODIUM CHLORIDE 0.9% FLUSH: 3.0000 mL | INTRAVENOUS | Status: DC | PRN

## 2019-08-30 MED ORDER — PHENYLEPHRINE 40 MCG/ML (10ML) SYRINGE FOR IV PUSH (FOR BLOOD PRESSURE SUPPORT)
PREFILLED_SYRINGE | INTRAVENOUS | Status: AC
  Filled 2019-08-30: qty 10

## 2019-08-30 MED ORDER — MIDAZOLAM HCL 2 MG/2ML IJ SOLN
INTRAMUSCULAR | Status: DC | PRN
  Administered 2019-08-30: 2 mg via INTRAVENOUS

## 2019-08-30 MED ORDER — BUPIVACAINE HCL (PF) 0.25 % IJ SOLN
INTRAMUSCULAR | Status: DC | PRN
  Administered 2019-08-30: 5 mL

## 2019-08-30 MED ORDER — FENTANYL CITRATE (PF) 250 MCG/5ML IJ SOLN
INTRAMUSCULAR | Status: AC
  Filled 2019-08-30: qty 5

## 2019-08-30 MED ORDER — ONDANSETRON HCL 4 MG PO TABS: 4.0000 mg | ORAL_TABLET | Freq: Four times a day (QID) | ORAL | Status: DC | PRN

## 2019-08-30 MED ORDER — CEFAZOLIN SODIUM-DEXTROSE 2-4 GM/100ML-% IV SOLN
2.0000 g | Freq: Three times a day (TID) | INTRAVENOUS | Status: AC
  Administered 2019-08-30 – 2019-08-31 (×2): 2 g via INTRAVENOUS
  Filled 2019-08-30 (×2): qty 100

## 2019-08-30 MED ORDER — PHENYLEPHRINE 40 MCG/ML (10ML) SYRINGE FOR IV PUSH (FOR BLOOD PRESSURE SUPPORT)
PREFILLED_SYRINGE | INTRAVENOUS | Status: DC | PRN
  Administered 2019-08-30: 80 ug via INTRAVENOUS

## 2019-08-30 MED ORDER — ONDANSETRON HCL 4 MG/2ML IJ SOLN
INTRAMUSCULAR | Status: DC | PRN
  Administered 2019-08-30: 4 mg via INTRAVENOUS

## 2019-08-30 MED ORDER — ORAL CARE MOUTH RINSE: 15.0000 mL | Freq: Once | OROMUCOSAL | Status: AC

## 2019-08-30 MED ORDER — HEMOSTATIC AGENTS (NO CHARGE) OPTIME
TOPICAL | Status: DC | PRN
  Administered 2019-08-30: 1 via TOPICAL

## 2019-08-30 MED ORDER — SUCCINYLCHOLINE CHLORIDE 20 MG/ML IJ SOLN
INTRAMUSCULAR | Status: DC | PRN
  Administered 2019-08-30: 140 mg via INTRAVENOUS

## 2019-08-30 MED ORDER — METHOCARBAMOL 500 MG PO TABS
ORAL_TABLET | ORAL | Status: AC
  Filled 2019-08-30: qty 1

## 2019-08-30 MED ORDER — IRBESARTAN 300 MG PO TABS
300.0000 mg | ORAL_TABLET | Freq: Every day | ORAL | Status: DC
  Administered 2019-08-30 – 2019-08-31 (×2): 300 mg via ORAL
  Filled 2019-08-30 (×2): qty 1

## 2019-08-30 MED ORDER — FENTANYL CITRATE (PF) 100 MCG/2ML IJ SOLN
INTRAMUSCULAR | Status: AC
  Filled 2019-08-30: qty 2

## 2019-08-30 MED ORDER — ROCURONIUM BROMIDE 10 MG/ML (PF) SYRINGE
PREFILLED_SYRINGE | INTRAVENOUS | Status: AC
  Filled 2019-08-30: qty 10

## 2019-08-30 MED ORDER — SODIUM CHLORIDE 0.9% FLUSH
3.0000 mL | Freq: Two times a day (BID) | INTRAVENOUS | Status: DC
  Administered 2019-08-30: 3 mL via INTRAVENOUS

## 2019-08-30 MED ORDER — THROMBIN 5000 UNITS EX SOLR
CUTANEOUS | Status: DC | PRN
  Administered 2019-08-30 (×2): 5000 [IU] via TOPICAL

## 2019-08-30 MED ORDER — FENTANYL CITRATE (PF) 100 MCG/2ML IJ SOLN
25.0000 ug | INTRAMUSCULAR | Status: DC | PRN
  Administered 2019-08-30: 50 ug via INTRAVENOUS

## 2019-08-30 MED ORDER — SCOPOLAMINE 1 MG/3DAYS TD PT72
MEDICATED_PATCH | TRANSDERMAL | Status: AC
  Administered 2019-08-30: 1.5 mg via TRANSDERMAL
  Filled 2019-08-30: qty 1

## 2019-08-30 MED ORDER — OXYCODONE HCL 5 MG PO TABS
5.0000 mg | ORAL_TABLET | ORAL | Status: DC | PRN
  Administered 2019-08-31: 5 mg via ORAL
  Filled 2019-08-30: qty 1

## 2019-08-30 MED ORDER — ONDANSETRON HCL 4 MG/2ML IJ SOLN: 4.0000 mg | Freq: Four times a day (QID) | INTRAMUSCULAR | Status: DC | PRN

## 2019-08-30 MED ORDER — MENTHOL 3 MG MT LOZG: 1.0000 | LOZENGE | OROMUCOSAL | Status: DC | PRN

## 2019-08-30 MED ORDER — THROMBIN 5000 UNITS EX SOLR
CUTANEOUS | Status: AC
  Filled 2019-08-30: qty 15000

## 2019-08-30 MED ORDER — DEXAMETHASONE SODIUM PHOSPHATE 4 MG/ML IJ SOLN
4.0000 mg | Freq: Four times a day (QID) | INTRAMUSCULAR | Status: DC
  Administered 2019-08-30 (×2): 4 mg via INTRAVENOUS
  Filled 2019-08-30 (×2): qty 1

## 2019-08-30 MED ORDER — ACETAMINOPHEN 500 MG PO TABS
ORAL_TABLET | ORAL | Status: AC
  Administered 2019-08-30: 1000 mg via ORAL
  Filled 2019-08-30: qty 2

## 2019-08-30 MED ORDER — METHOCARBAMOL 1000 MG/10ML IJ SOLN
500.0000 mg | Freq: Four times a day (QID) | INTRAVENOUS | Status: DC | PRN
  Filled 2019-08-30: qty 5

## 2019-08-30 MED ORDER — ROCURONIUM BROMIDE 10 MG/ML (PF) SYRINGE
PREFILLED_SYRINGE | INTRAVENOUS | Status: DC | PRN
  Administered 2019-08-30: 60 mg via INTRAVENOUS

## 2019-08-30 MED ORDER — ACETAMINOPHEN 325 MG PO TABS: 650.0000 mg | ORAL_TABLET | ORAL | Status: DC | PRN

## 2019-08-30 MED ORDER — INDAPAMIDE 1.25 MG PO TABS
1.2500 mg | ORAL_TABLET | Freq: Every day | ORAL | Status: DC
  Administered 2019-08-30 – 2019-08-31 (×2): 1.25 mg via ORAL
  Filled 2019-08-30 (×2): qty 1

## 2019-08-30 MED ORDER — PHENOL 1.4 % MT LIQD: 1.0000 | OROMUCOSAL | Status: DC | PRN

## 2019-08-30 MED ORDER — DEXAMETHASONE 4 MG PO TABS
4.0000 mg | ORAL_TABLET | Freq: Four times a day (QID) | ORAL | Status: DC
  Administered 2019-08-31: 4 mg via ORAL
  Filled 2019-08-30 (×2): qty 1

## 2019-08-30 MED ORDER — THROMBIN 5000 UNITS EX SOLR: OROMUCOSAL | Status: DC | PRN

## 2019-08-30 MED ORDER — CEFAZOLIN SODIUM-DEXTROSE 2-4 GM/100ML-% IV SOLN
2.0000 g | INTRAVENOUS | Status: AC
  Administered 2019-08-30: 2 g via INTRAVENOUS

## 2019-08-30 MED ORDER — LACTATED RINGERS IV SOLN: INTRAVENOUS | Status: DC

## 2019-08-30 MED ORDER — ALBUTEROL SULFATE HFA 108 (90 BASE) MCG/ACT IN AERS
INHALATION_SPRAY | RESPIRATORY_TRACT | Status: DC | PRN
  Administered 2019-08-30: 4 via RESPIRATORY_TRACT

## 2019-08-30 SURGICAL SUPPLY — 53 items
BAG DECANTER FOR FLEXI CONT (MISCELLANEOUS) ×3 IMPLANT
BAND RUBBER #18 3X1/16 STRL (MISCELLANEOUS) ×6 IMPLANT
BASKET BONE COLLECTION (BASKET) IMPLANT
BENZOIN TINCTURE PRP APPL 2/3 (GAUZE/BANDAGES/DRESSINGS) ×3 IMPLANT
BIT DRILL 14MM (INSTRUMENTS) IMPLANT
BUR CARBIDE MATCH 3.0 (BURR) ×3 IMPLANT
CANISTER SUCT 3000ML PPV (MISCELLANEOUS) ×3 IMPLANT
CARTRIDGE OIL MAESTRO DRILL (MISCELLANEOUS) ×1 IMPLANT
CLOSURE WOUND 1/2 X4 (GAUZE/BANDAGES/DRESSINGS) ×1
COVER WAND RF STERILE (DRAPES) ×3 IMPLANT
DIFFUSER DRILL AIR PNEUMATIC (MISCELLANEOUS) ×3 IMPLANT
DRAPE C-ARM 42X72 X-RAY (DRAPES) ×6 IMPLANT
DRAPE LAPAROTOMY 100X72 PEDS (DRAPES) ×3 IMPLANT
DRAPE MICROSCOPE LEICA (MISCELLANEOUS) ×3 IMPLANT
DRILL 14MM (INSTRUMENTS) ×3
DRSG OPSITE POSTOP 3X4 (GAUZE/BANDAGES/DRESSINGS) ×2 IMPLANT
DURAPREP 6ML APPLICATOR 50/CS (WOUND CARE) ×3 IMPLANT
ELECT COATED BLADE 2.86 ST (ELECTRODE) ×3 IMPLANT
ELECT REM PT RETURN 9FT ADLT (ELECTROSURGICAL) ×3
ELECTRODE REM PT RTRN 9FT ADLT (ELECTROSURGICAL) ×1 IMPLANT
GAUZE 4X4 16PLY RFD (DISPOSABLE) IMPLANT
GLOVE BIO SURGEON STRL SZ7 (GLOVE) IMPLANT
GLOVE BIO SURGEON STRL SZ8 (GLOVE) ×3 IMPLANT
GLOVE BIOGEL PI IND STRL 7.0 (GLOVE) IMPLANT
GLOVE BIOGEL PI INDICATOR 7.0 (GLOVE)
GOWN STRL REUS W/ TWL LRG LVL3 (GOWN DISPOSABLE) IMPLANT
GOWN STRL REUS W/ TWL XL LVL3 (GOWN DISPOSABLE) IMPLANT
GOWN STRL REUS W/TWL 2XL LVL3 (GOWN DISPOSABLE) ×3 IMPLANT
GOWN STRL REUS W/TWL LRG LVL3 (GOWN DISPOSABLE)
GOWN STRL REUS W/TWL XL LVL3 (GOWN DISPOSABLE)
HEMOSTAT POWDER KIT SURGIFOAM (HEMOSTASIS) ×3 IMPLANT
KIT BASIN OR (CUSTOM PROCEDURE TRAY) ×3 IMPLANT
KIT TURNOVER KIT B (KITS) ×3 IMPLANT
NDL HYPO 25X1 1.5 SAFETY (NEEDLE) ×1 IMPLANT
NDL SPNL 20GX3.5 QUINCKE YW (NEEDLE) ×1 IMPLANT
NEEDLE HYPO 25X1 1.5 SAFETY (NEEDLE) ×3 IMPLANT
NEEDLE SPNL 20GX3.5 QUINCKE YW (NEEDLE) ×3 IMPLANT
NS IRRIG 1000ML POUR BTL (IV SOLUTION) ×3 IMPLANT
OIL CARTRIDGE MAESTRO DRILL (MISCELLANEOUS) ×3
PACK LAMINECTOMY NEURO (CUSTOM PROCEDURE TRAY) ×3 IMPLANT
PAD ARMBOARD 7.5X6 YLW CONV (MISCELLANEOUS) ×3 IMPLANT
PIN DISTRACTION 14MM (PIN) ×6 IMPLANT
PLATE 16MM (Plate) ×2 IMPLANT
SCREW CANN 4X16 SS S/DRILL (Screw) ×8 IMPLANT
SPACER ASSEM CERV LORD 9M (Spacer) ×2 IMPLANT
SPONGE INTESTINAL PEANUT (DISPOSABLE) ×3 IMPLANT
SPONGE SURGIFOAM ABS GEL SZ50 (HEMOSTASIS) ×3 IMPLANT
STRIP CLOSURE SKIN 1/2X4 (GAUZE/BANDAGES/DRESSINGS) ×2 IMPLANT
SUT VIC AB 3-0 SH 8-18 (SUTURE) ×3 IMPLANT
SUT VICRYL 4-0 PS2 18IN ABS (SUTURE) IMPLANT
TOWEL GREEN STERILE (TOWEL DISPOSABLE) ×3 IMPLANT
TOWEL GREEN STERILE FF (TOWEL DISPOSABLE) ×3 IMPLANT
WATER STERILE IRR 1000ML POUR (IV SOLUTION) ×3 IMPLANT

## 2019-08-30 NOTE — H&P (Signed)
Subjective:   Patient is a 64 y.o. male admitted for cervical stenosis with myeopathy. The patient first presented to me with complaints of neck pain, numbness of the arm(s) and loss of strength of the arm(s). Onset of symptoms was several weeks ago. The pain is described as aching and occurs intermittently. The pain is rated mild, and is located in the neck and radiates to the R>L UE. Difficulty with gait. The symptoms have been progressive. Symptoms are exacerbated by extending head backwards, and are relieved by none.  Previous work up includes MRI of cervical spine, results: spinal stenosis.  Past Medical History:  Diagnosis Date  . Allergy   . Arthritis    right shoulder  . Asthma   . Eczema   . HTN (hypertension)   . Hyperlipidemia   . Substance abuse ([place]) 1976   daily    Past Surgical History:  Procedure Laterality Date  . COLONOSCOPY    . KNEE ARTHROSCOPY Left     Allergies  Allergen Reactions  . Sulfur     Social History   Tobacco Use  . Smoking status: Former Smoker    Types: Cigarettes, Cigars    Quit date: 06/30/2012    Years since quitting: 7.1  . Smokeless tobacco: Former Systems developer    Types: Chew    Quit date: 07/01/1987  Substance Use Topics  . Alcohol use: Yes    Alcohol/week: 4.0 standard drinks    Types: 4 Shots of liquor per week    Comment: + occasional beer    Family History  Problem Relation Age of Onset  . Colon cancer Father   . Early death Brother   . Kidney failure Brother   . Cancer Maternal Grandmother   . Cancer Maternal Grandfather   . Early death Sister   . Kidney failure Sister   . Tongue cancer Maternal Aunt   . Pancreatic cancer Maternal Aunt   . Colon polyps Neg Hx   . Esophageal cancer Neg Hx   . Stomach cancer Neg Hx   . Rectal cancer Neg Hx    Prior to Admission medications   Medication Sig Start Date End Date Taking? Authorizing Provider  acetaminophen (TYLENOL) 650 MG CR tablet Take 1,300 mg by mouth every 8 (eight) hours as  needed for pain.   Yes [provider]  atorvastatin (LIPITOR) 10 MG tablet Take 1 tablet (10 mg total) by mouth daily. 07/12/19  Yes Janith Lima, MD  Azilsartan Medoxomil (EDARBI) 40 MG TABS Take 1 tablet by mouth daily. Patient taking differently: Take 1 tablet by mouth in the morning.  08/23/19  Yes Janith Lima, MD  bisacodyl (DULCOLAX) 5 MG EC tablet Take 5 mg by mouth See admin instructions. Take 5 mg daily, may take a second 5 mg dose as needed for constipation   Yes [provider]  carvedilol (COREG) 6.25 MG tablet Take 1 tablet (6.25 mg total) by mouth 2 (two) times daily with a meal. 07/12/19  Yes Janith Lima, MD  fexofenadine (ALLEGRA) 180 MG tablet Take 180 mg by mouth daily.   Yes [provider]  indapamide (LOZOL) 1.25 MG tablet Take 1 tablet (1.25 mg total) by mouth daily. 07/12/19  Yes Janith Lima, MD  methylPREDNISolone (MEDROL DOSEPAK) 4 MG TBPK tablet Take 4-24 mg by mouth See admin instructions. Take these number of tablets on consecutive ZGYF:7-4-9-4-4-9   Yes [provider]  Multiple Vitamin (MULTIVITAMIN WITH MINERALS) TABS tablet Take 1  tablet by mouth daily. Centrum Silver   Yes [provider]  triamcinolone cream (KENALOG) 0.1 % Apply 1 application topically 2 (two) times daily as needed (skin irritation/rash (hands)).   Yes [provider]  ibuprofen (ADVIL) 200 MG tablet Take 400 mg by mouth every 6 (six) hours as needed (back ache).    [provider]     Review of Systems  Positive ROS: neg  All other systems have been reviewed and were otherwise negative with the exception of those mentioned in the HPI and as above.  Objective: Vital signs in last 24 hours: Temp:  [98.3 F (36.8 C)] 98.3 F (36.8 C) (05/24 1137) Pulse Rate:  [76] 76 (05/24 1137) Resp:  [17] 17 (05/24 1137) BP: (168-186)/(108-111) 168/108 (05/24 1146) SpO2:  [97 %] 97 % (05/24 1137) Weight:  [81.6 kg] 81.6 kg (05/24  1137)  General Appearance: Alert, cooperative, no distress, appears stated age Head: Normocephalic, without obvious abnormality, atraumatic Eyes: PERRL, conjunctiva/corneas clear, EOM's intact      Neck: Supple, symmetrical, trachea midline, Back: Symmetric, no curvature, ROM normal, no CVA tenderness Lungs:  respirations unlabored Heart: Regular rate and rhythm Abdomen: Soft, non-tender Extremities: Extremities normal, atraumatic, no cyanosis or edema Pulses: 2+ and symmetric all extremities Skin: Skin color, texture, turgor normal, no rashes or lesions  NEUROLOGIC:  Mental status: Alert and oriented x4, no aphasia, good attention span, fund of knowledge and memory  Motor Exam - RUE 4/5, RLE 4/5 Sensory Exam - grossly normal Reflexes: 3+ Coordination - grossly normal Gait - spastic Balance -abnormal Cranial Nerves: I: smell Not tested  II: visual acuity  OS: nl    OD: nl  II: visual fields Full to confrontation  II: pupils Equal, round, reactive to light  III,VII: ptosis None  III,IV,VI: extraocular muscles  Full ROM  V: mastication Normal  V: facial light touch sensation  Normal  V,VII: corneal reflex  Present  VII: facial muscle function - upper  Normal  VII: facial muscle function - lower Normal  VIII: hearing Not tested  IX: soft palate elevation  Normal  IX,X: gag reflex Present  XI: trapezius strength  5/5  XI: sternocleidomastoid strength 5/5  XI: neck flexion strength  5/5  XII: tongue strength  Normal    Data Review Lab Results  Component Value Date   WBC 6.5 07/12/2019   HGB 14.3 07/12/2019   HCT 43.0 07/12/2019   MCV 99.3 07/12/2019   PLT 216.0 07/12/2019   Lab Results  Component Value Date   NA 140 07/12/2019   K 3.5 07/12/2019   CL 104 07/12/2019   CO2 27 07/12/2019   BUN 13 07/12/2019   CREATININE 0.76 07/12/2019   GLUCOSE 97 07/12/2019   No results found for: INR, PROTIME  Assessment:   Cervical neck pain with herniated nucleus  pulposus/ 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 Estimated body mass index is 25.1 kg/m as calculated from the following:   Height as of this encounter: 5\' 11"  (1.803 m).   Weight as of this encounter: 81.6 kg.  Patient has failed conservative therapy. Planned surgery : ACDF C3-4  Plan:   I explained the condition and procedure to the patient and answered any questions.  Patient wishes to proceed with procedure as planned. Understands risks/ benefits/ and expected or typical outcomes.  08/30/2019 12:40 PM

## 2019-08-30 NOTE — Anesthesia Preprocedure Evaluation (Addendum)
Anesthesia Evaluation  Patient identified by MRN, date of birth, ID band Patient awake    Reviewed: Allergy & Precautions, NPO status , Patient's Chart, lab work & pertinent test results  History of Anesthesia Complications Negative for: history of anesthetic complications  Airway Mallampati: Trach   Neck ROM: Full    Dental  (+) Dental Advisory Given   Pulmonary asthma , former smoker,    Pulmonary exam normal        Cardiovascular hypertension, Pt. on home beta blockers Normal cardiovascular exam     Neuro/Psych negative neurological ROS     GI/Hepatic negative GI ROS, Neg liver ROS,   Endo/Other  negative endocrine ROS  Renal/GU negative Renal ROS     Musculoskeletal negative musculoskeletal ROS (+)   Abdominal   Peds  Hematology negative hematology ROS (+)   Anesthesia Other Findings   Reproductive/Obstetrics                          Anesthesia Physical Anesthesia Plan  ASA: II  Anesthesia Plan: General   Post-op Pain Management:    Induction: Intravenous  PONV Risk Score and Plan: 3 and Ondansetron, Dexamethasone and Scopolamine patch - Pre-op  Airway Management Planned: Oral ETT and Video Laryngoscope Planned  Additional Equipment:   Intra-op Plan:   Post-operative Plan: Extubation in OR  Informed Consent: I have reviewed the patients History and Physical, chart, labs and discussed the procedure including the risks, benefits and alternatives for the proposed anesthesia with the patient or authorized representative who has indicated his/her understanding and acceptance.     Dental advisory given  Plan Discussed with: Anesthesiologist and Surgeon  Anesthesia Plan Comments:        Anesthesia Quick Evaluation

## 2019-08-30 NOTE — Anesthesia Procedure Notes (Signed)
Procedure Name: Intubation Date/Time: 08/30/2019 1:12 PM Performed by: De Nurse, CRNA Pre-anesthesia Checklist: Patient identified, Emergency Drugs available, Suction available and Patient being monitored Patient Re-evaluated:Patient Re-evaluated prior to induction Oxygen Delivery Method: Circle System Utilized Preoxygenation: Pre-oxygenation with 100% oxygen Induction Type: IV induction and Rapid sequence Ventilation: Mask ventilation without difficulty Laryngoscope Size: Glidescope and 4 Grade View: Grade I Tube type: Oral Tube size: 7.5 mm Number of attempts: 1 Airway Equipment and Method: Stylet and Oral airway Placement Confirmation: ETT inserted through vocal cords under direct vision,  positive ETCO2 and breath sounds checked- equal and bilateral Secured at: 22 cm Tube secured with: Tape Dental Injury: Teeth and Oropharynx as per pre-operative assessment  Comments: Elective Glidescope used d/t limited neck movement

## 2019-08-30 NOTE — Transfer of Care (Signed)
Immediate Anesthesia Transfer of Care Note  Patient: Larry Blair  Procedure(s) Performed: CERVICAL THREE-FOUR ANTERIOR CERVICAL DECOMPRESSION/FUSION (N/A Spine Cervical)  Patient Location: PACU  Anesthesia Type:General  Level of Consciousness: awake, alert  and oriented  Airway & Oxygen Therapy: Patient Spontanous Breathing  Post-op Assessment: Report given to RN and Patient moving all extremities X 4  Post vital signs: Reviewed and stable  Last Vitals:  Vitals Value Taken Time  BP 114/101 08/30/19 1503  Temp    Pulse 71 08/30/19 1503  Resp 19 08/30/19 1503  SpO2 97 % 08/30/19 1503  Vitals shown include unvalidated device data.  Last Pain:  Vitals:   08/30/19 1219  TempSrc:   PainSc: 0-No pain         Complications: No apparent anesthesia complications

## 2019-08-30 NOTE — Progress Notes (Signed)
Patient's blood pressure 168/108.  Patient states that he did not have his Coreg this AM.  Dr. Krista Blue made aware of blood pressure and received verbal order to give 6.25 Coreg.  Will continue to monitor patient.

## 2019-08-30 NOTE — Anesthesia Postprocedure Evaluation (Signed)
Anesthesia Post Note  Patient: Surafel Hilleary  Procedure(s) Performed: CERVICAL THREE-FOUR ANTERIOR CERVICAL DECOMPRESSION/FUSION (N/A Spine Cervical)     Patient location during evaluation: PACU Anesthesia Type: General Level of consciousness: awake and alert Pain management: pain level controlled Vital Signs Assessment: post-procedure vital signs reviewed and stable Respiratory status: spontaneous breathing, nonlabored ventilation, respiratory function stable and patient connected to nasal cannula oxygen Cardiovascular status: blood pressure returned to baseline and stable Postop Assessment: no apparent nausea or vomiting Anesthetic complications: no    Last Vitals:  Vitals:   08/30/19 1515 08/30/19 1530  BP: (!) 153/94 (!) 147/100  Pulse: 67 65  Resp: (!) 21 12  Temp:  36.6 C  SpO2: 97% 94%    Last Pain:  Vitals:   08/30/19 1530  TempSrc:   PainSc: 2                  Reilley Valentine DANIEL

## 2019-08-30 NOTE — Op Note (Signed)
08/30/2019  2:54 PM  PATIENT:  Larry Blair  64 y.o. 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 Decompressive anterior cervical discectomy C3-4, 2. Anterior cervical arthrodesis C3-4 utilizing allograft bone graft, 3. Anterior cervical plating C3-4 utilizing a Alphatec plate  SURGEON:  Sherley Bounds, MD  ASSISTANTS: Glenford Peers, FNP  ANESTHESIA:   General  EBL: 50 ml  Total I/O In: -  Out: 50 [Blood:50]  BLOOD ADMINISTERED: none  DRAINS: none  SPECIMEN:  none  INDICATION FOR PROCEDURE: This patient presented with numbness and weakness in his arms and difficulty with gait. Imaging showed severe stenosis C3-4 with signal change in the cord.  He was seen urgently by his neurologist for sideration of decompressive surgery.  Recommended ACDF with plating. Patient understood the risks, benefits, and alternatives and potential outcomes and wished to proceed.  PROCEDURE DETAILS: Patient was brought to the operating room placed under general endotracheal anesthesia. Patient was placed in the supine position on the operating room table. The neck was prepped with Duraprep and draped in a sterile fashion.   Three cc of local anesthesia was injected and a transverse incision was made on the right side of the neck.  Dissection was carried down thru the subcutaneous tissue and the platysma was  elevated, opened, and undermined with Metzenbaum scissors.  Dissection was then carried out thru an avascular plane leaving the sternocleidomastoid carotid artery and jugular vein laterally and the trachea and esophagus medially with the assistance of my nurse practitioner. The ventral aspect of the vertebral column was identified and a localizing x-ray was taken. The C3-4 level was identified and all in the room agreed with the level. The longus colli muscles were then elevated and the retractor  was placed with the assistance of my nurse practitioner. The annulus was incised and the disc space entered. Discectomy was performed with micro-curettes and pituitary rongeurs. I then used the high-speed drill to drill the endplates down to the level of the posterior longitudinal ligament. The drill shavings were saved in a mucous trap for later arthrodesis. The operating microscope was draped and brought into the field provided additional magnification, illumination and visualization. Discectomy was continued posteriorly thru the disc space. Posterior longitudinal ligament was opened with a nerve hook, and then removed along with disc herniation and osteophytes, decompressing the spinal canal and thecal sac. We then continued to remove osteophytic overgrowth and disc material decompressing the neural foramina and exiting nerve roots bilaterally. The scope was angled up and down to help decompress and undercut the vertebral bodies. Once the decompression was completed we could pass a nerve hook circumferentially to assure adequate decompression in the midline and in the neural foramina. So by both visualization and palpation we felt we had an adequate decompression of the neural elements. We then measured the height of the intravertebral disc space and selected a 9 mm cortical cancellus allograft.  It was then gently positioned in the intravertebral disc space(s) and countersunk. I then used a 16 mm Alphatec plate and placed variable angle screws into the vertebral bodies of each level and locked them into position. The wound was irrigated with bacitracin solution, checked for hemostasis which was established and confirmed. Once meticulous hemostasis was achieved, we then proceeded with closure with the assistance of my nurse practitioner. The platysma was closed with interrupted 3-0 undyed Vicryl suture, the subcuticular layer was closed with interrupted 3-0 undyed  Vicryl suture. The skin edges were approximated  with steristrips. The drapes were removed. A sterile dressing was applied. The patient was then awakened from general anesthesia and transferred to the recovery room in stable condition. At the end of the procedure all sponge, needle and instrument counts were correct.   PLAN OF CARE: Admit for overnight observation  PATIENT DISPOSITION:  PACU - hemodynamically stable.   Delay start of Pharmacological VTE agent (>24hrs) due to surgical blood loss or risk of bleeding:  yes

## 2019-08-31 ENCOUNTER — Telehealth: Payer: Self-pay | Admitting: *Deleted

## 2019-08-31 ENCOUNTER — Encounter: Payer: Self-pay | Admitting: *Deleted

## 2019-08-31 MED ORDER — METHOCARBAMOL 500 MG PO TABS: 500.0000 mg | ORAL_TABLET | Freq: Three times a day (TID) | ORAL | 1 refills | Status: DC | PRN

## 2019-08-31 MED ORDER — OXYCODONE HCL 5 MG PO TABS: 5.0000 mg | ORAL_TABLET | ORAL | 0 refills | Status: DC | PRN

## 2019-08-31 NOTE — Progress Notes (Signed)
CSW received consult to speak with patient. Patient requested help with applying for hospital financial assistance. CSW placed Financial Counseling contact info on AVS for patient. Patient reports that he has an appointment with Social Security on June 22 to apply for Medicare. CSW went over medication costs with patient. He confirmed his pharmacy and stated that he would be able to afford the medications. Patient reported that staff at the hospital have been wonderful and he expressed his appreciation. No other needs identified.   Kohl Polinsky LCSW

## 2019-08-31 NOTE — Progress Notes (Signed)
Patient alert and oriented, mae's well, voiding adequate amount of urine, swallowing without difficulty, no c/o pain at time of discharge. Patient discharged home with family. Script and discharged instructions given to patient. Patient and family stated understanding of instructions given. Patient has an appointment with Dr. Jones °

## 2019-08-31 NOTE — Evaluation (Signed)
Occupational Therapy Evaluation  Patient Details Name: Larry Blair MRN: [id number] DOB: 10-Aug-1955 Today's Date: 08/31/2019    History of Present Illness Patient is a 64 y/o male who presents s/p C3-4 ACDF 08/30/19. PMH includes HTN, HLD and substance abuse.   Clinical Impression   Pt educated in detail in ADL following cervical precautions, compensatory strategies and IADL to avoid. He is overall functioning at a set up to supervision level for safety with ADL. All DME needs are met at home. Pt verbalized and/or demonstrated understanding of all education. No further OT needs.    Follow Up Recommendations  No OT follow up    Equipment Recommendations  None recommended by OT    Recommendations for Other Services       Precautions / Restrictions Precautions Precautions: Cervical Precaution Booklet Issued: Yes (comment) Precaution Comments: Reviewed precautions and handout Restrictions Weight Bearing Restrictions: No      Mobility Bed Mobility Overal bed mobility: Needs Assistance Bed Mobility: Rolling;Sidelying to Sit Rolling: Modified independent (Device/Increase time) Sidelying to sit: Modified independent (Device/Increase time)       General bed mobility comments: HOB flat, no rails to simulate home. Good demo of log roll technique.  Transfers Overall transfer level: Needs assistance Equipment used: Straight cane Transfers: Sit to/from Stand Sit to Stand: Supervision         General transfer comment: Supervision for safety. Stood from [employer].    Balance Overall balance assessment: Needs assistance;History of Falls Sitting-balance support: Feet supported;No upper extremity supported Sitting balance-Leahy Scale: Good     Standing balance support: During functional activity Standing balance-Leahy Scale: Fair Standing balance comment: Requires Ue support for dynamic tasks                           ADL either performed or assessed with clinical  judgement   ADL                                         General ADL Comments: Overall functioning at a set up to supervision level. Educated in compensatory strategies for grooming, pericare and LB dressing. Instructed in IADL to avoid.      Vision Patient Visual Report: No change from baseline       Perception     Praxis      Pertinent Vitals/Pain Pain Assessment: 0-10 Pain Score: 2  Pain Location: surgical site Pain Descriptors / Indicators: Operative site guarding Pain Intervention(s): Monitored during session     Hand Dominance Right   Extremity/Trunk Assessment Upper Extremity Assessment Upper Extremity Assessment: Overall WFL for tasks assessed RUE Deficits / Details: pt able to open containers and use R UE with improved strength and comfort compared to PTA, remains somewhat weaker than L, but functional RUE Sensation: WNL RUE Coordination: decreased fine motor   Lower Extremity Assessment Lower Extremity Assessment: Defer to PT evaluation RLE Deficits / Details: Grossly ~4/5 throughout, but weaker than LLE. RLE Sensation: WNL   Cervical / Trunk Assessment Cervical / Trunk Assessment: Other exceptions Cervical / Trunk Exceptions: s/p neck surgery   Communication Communication Communication: No difficulties   Cognition Arousal/Alertness: Awake/alert Behavior During Therapy: WFL for tasks assessed/performed Overall Cognitive Status: Within Functional Limits for tasks assessed  General Comments  bandage- clean dry and intact.    Exercises     Shoulder Instructions      Home Living Family/patient expects to be discharged to:: Private residence Living Arrangements: Spouse/significant other Available Help at Discharge: Family;Available 24 hours/day Type of Home: House Home Access: Ramped entrance;Stairs to enter Entrance Stairs-Number of Steps: or 4 steps with rail Entrance  Stairs-Rails: Right Home Layout: One level     Bathroom Shower/Tub: Tub/shower unit   Bathroom Toilet: Handicapped height     Home Equipment: Cane - single point;Wheelchair - manual;Walker - 2 wheels;Shower seat;Grab bars - tub/shower;Grab bars - toilet;Hand held shower head          Prior Functioning/Environment Level of Independence: Independent with assistive device(s)        Comments: Has been caregiver for his wife and running the household. Been using a SPC for mobility the last 3-4 weeks due to weakness. 2 falls reported. Works as an auto mechanic.        OT Problem List:        OT Treatment/Interventions:      OT Goals(Current goals can be found in the care plan section) Acute Rehab OT Goals Patient Stated Goal: " to go see my mother in [place]"  OT Frequency:     Barriers to D/C:            Co-evaluation              AM-PAC OT "6 Clicks" Daily Activity     Outcome Measure Help from another person eating meals?: None Help from another person taking care of personal grooming?: A Little Help from another person toileting, which includes using toliet, bedpan, or urinal?: A Little Help from another person bathing (including washing, rinsing, drying)?: None Help from another person to put on and taking off regular upper body clothing?: None Help from another person to put on and taking off regular lower body clothing?: A Little 6 Click Score: 21   End of Session    Activity Tolerance: Patient tolerated treatment well Patient left: in bed;with call bell/phone within reach  OT Visit Diagnosis: Unsteadiness on feet (R26.81)                Time: 0821-0846 OT Time Calculation (min): 25 min Charges:  OT General Charges $OT Visit: 1 Visit OT Evaluation $OT Eval Moderate Complexity: 1 Mod  Julie Mayberry, OTR/L Acute Rehabilitation Services Pager: [phone] Office: [phone]  Mayberry, Julie Lynn 08/31/2019, 9:03 AM 

## 2019-08-31 NOTE — Evaluation (Signed)
Physical Therapy Evaluation Patient Details Name: Larry Blair MRN: [id number] DOB: [birth year] Today's Date: 08/31/2019   History of Present Illness  Patient is a 64 y/o male who presents s/p C3-4 ACDF 08/30/19. PMH includes HTN, HLD and substance abuse.  Clinical Impression  Patient presents with pain and post surgical deficits s/p above surgery. Pt reports being independent PTA, caregiver for his wife and working as an Journalist, newspaper. Has been using SPC for the last 3-4 weeks due to weakness and 2 falls. Today, pt tolerated bed mobility, transfers and gait training with supervision for safety. Education re: neck precautions, log roll technique, walking program etc. Pt has support from sons at d/c. Pt does not require skilled therapy services as pt functioning at supervision level. All education completed. Discharge from therapy.    Follow Up Recommendations No PT follow up;Supervision - Intermittent    Equipment Recommendations  None recommended by PT    Recommendations for Other Services       Precautions / Restrictions Precautions Precautions: Cervical Precaution Booklet Issued: Yes (comment) Precaution Comments: Reviewed orecautions and handout Restrictions Weight Bearing Restrictions: No      Mobility  Bed Mobility Overal bed mobility: Needs Assistance Bed Mobility: Rolling;Sidelying to Sit Rolling: Modified independent (Device/Increase time) Sidelying to sit: Modified independent (Device/Increase time)       General bed mobility comments: HOB flat, no rails to simulate home. Good demo of log roll technique.  Transfers Overall transfer level: Needs assistance Equipment used: Straight cane Transfers: Sit to/from Stand Sit to Stand: Supervision         General transfer comment: Supervision for safety. Stood from [employer].  Ambulation/Gait Ambulation/Gait assistance: Supervision Gait Distance (Feet): 400 Feet Assistive device: Straight cane Gait  Pattern/deviations: Step-to pattern;Step-through pattern;Decreased step length - left;Narrow base of support Gait velocity: decreased   General Gait Details: Slow, step to progressing to step through gait using SPC. Cues for upright posture.  Stairs            Wheelchair Mobility    Modified Rankin (Stroke Patients Only)       Balance Overall balance assessment: Needs assistance;History of Falls Sitting-balance support: Feet supported;No upper extremity supported Sitting balance-Leahy Scale: Good     Standing balance support: During functional activity Standing balance-Leahy Scale: Fair Standing balance comment: Requires Ue support for dynamic tasks                             Pertinent Vitals/Pain Pain Assessment: 0-10 Pain Score: 2  Pain Location: surgical site Pain Descriptors / Indicators: Operative site guarding Pain Intervention(s): Repositioned;Monitored during session    Home Living Family/patient expects to be discharged to:: Private residence Living Arrangements: Spouse/significant other Available Help at Discharge: Family;Available 24 hours/day Type of Home: House Home Access: Ramped entrance;Stairs to enter Entrance Stairs-Rails: Right Entrance Stairs-Number of Steps: or 4 steps with rail Home Layout: One level Home Equipment: Cane - single point;Wheelchair - [employer] - 2 wheels;Shower seat;Grab bars - tub/shower;Grab bars - toilet      Prior Function Level of Independence: Independent with assistive device(s)         Comments: Has been caregiver for his wife and running the household. Been using a SPC for mobility the last 3-4 weeks due to weakness. 2 falls reported. Works as an Journalist, newspaper.     Hand Dominance   Dominant Hand: Right    Extremity/Trunk Assessment   Upper Extremity Assessment Upper  Extremity Assessment: RUE deficits/detail RUE Deficits / Details: weaker grip RUE Sensation: WNL RUE Coordination:  decreased fine motor    Lower Extremity Assessment Lower Extremity Assessment: RLE deficits/detail RLE Deficits / Details: Grossly ~4/5 throughout, but weaker than LLE. RLE Sensation: WNL    Cervical / Trunk Assessment Cervical / Trunk Assessment: Other exceptions Cervical / Trunk Exceptions: s/p neck surgery  Communication   Communication: No difficulties  Cognition Arousal/Alertness: Awake/alert Behavior During Therapy: WFL for tasks assessed/performed Overall Cognitive Status: Within Functional Limits for tasks assessed                                        General Comments General comments (skin integrity, edema, etc.): bandage- clean dry and intact.    Exercises     Assessment/Plan    PT Assessment Patent does not need any further PT services  PT Problem List         PT Treatment Interventions      PT Goals (Current goals can be found in the Care Plan section)  Acute Rehab PT Goals Patient Stated Goal: " to go see my mother in [place]" PT Goal Formulation: All assessment and education complete, DC therapy    Frequency     Barriers to discharge        Co-evaluation               AM-PAC PT "6 Clicks" Mobility  Outcome Measure Help needed turning from your back to your side while in a flat bed without using bedrails?: None Help needed moving from lying on your back to sitting on the side of a flat bed without using bedrails?: None Help needed moving to and from a bed to a chair (including a wheelchair)?: None Help needed standing up from a chair using your arms (e.g., wheelchair or bedside chair)?: None Help needed to walk in hospital room?: A Little Help needed climbing 3-5 steps with a railing? : A Little 6 Click Score: 22    End of Session   Activity Tolerance: Patient tolerated treatment well Patient left: in bed;with call bell/phone within reach;Other (comment)(with Ot present) Nurse Communication: Mobility status PT Visit  Diagnosis: Pain;Difficulty in walking, not elsewhere classified (R26.2);Muscle weakness (generalized) (M62.81) Pain - part of body: (neck)    Time: 0865-7846 PT Time Calculation (min) (ACUTE ONLY): 32 min   Charges:   PT Evaluation $PT Eval Moderate Complexity: 1 Mod PT Treatments $Gait Training: 8-22 mins        Marisa Severin, PT, DPT Acute Rehabilitation Services Pager [phone] Office [place] 08/31/2019, 8:49 AM

## 2019-08-31 NOTE — Discharge Summary (Signed)
Physician Discharge Summary  Patient ID: Larry Blair MRN: [id number] DOB/AGE: Aug 23, 1955 64 y.o.  Admit date: 08/30/2019 Discharge date: 08/31/2019  Admission Diagnoses: Cervical spondylitic myelopathy    Discharge Diagnoses: Same   Discharged Condition: stable  Hospital Course: The patient was admitted on 08/30/2019 and taken to the operating room where the patient underwent ACDF C3-4. The patient tolerated the procedure well and was taken to the recovery room and then to the floor in stable condition. The hospital course was routine. There were no complications. The wound remained clean dry and intact. Pt had no neck soreness and is requiring no pain medication. No complaints of arm pain or new N/T/W.  Had improvement of function of his right hand and somewhat in his gait.  The patient remained afebrile with stable vital signs, and tolerated a regular diet. The patient continued to increase activities, and pain was well controlled with oral pain medications.   Consults: None  Significant Diagnostic Studies:  Results for orders placed or performed during the hospital encounter of 08/30/19  Surgical pcr screen   Specimen: Nasal Mucosa; Nasal Swab  Result Value Ref Range   MRSA, PCR NEGATIVE NEGATIVE   Staphylococcus aureus NEGATIVE NEGATIVE  Basic metabolic panel  Result Value Ref Range   Sodium 140 135 - 145 mmol/L   Potassium 3.1 (L) 3.5 - 5.1 mmol/L   Chloride 101 98 - 111 mmol/L   CO2 29 22 - 32 mmol/L   Glucose, Bld 89 70 - 99 mg/dL   BUN 14 8 - 23 mg/dL   Creatinine, Ser 0.70 0.61 - 1.24 mg/dL   Calcium 9.5 8.9 - 10.3 mg/dL   GFR calc non Af Amer >60 >60 mL/min   GFR calc Af Amer >60 >60 mL/min   Anion gap 10 5 - 15  CBC WITH DIFFERENTIAL  Result Value Ref Range   WBC 8.3 4.0 - 10.5 K/uL   RBC 4.25 4.22 - 5.81 MIL/uL   Hemoglobin 13.7 13.0 - 17.0 g/dL   HCT 42.4 39.0 - 52.0 %   MCV 99.8 80.0 - 100.0 fL   MCH 32.2 26.0 - 34.0 pg   MCHC 32.3 30.0 - 36.0 g/dL   RDW  11.6 11.5 - 15.5 %   Platelets 208 150 - 400 K/uL   nRBC 0.0 0.0 - 0.2 %   Neutrophils Relative % 74 %   Neutro Abs 6.1 1.7 - 7.7 K/uL   Lymphocytes Relative 20 %   Lymphs Abs 1.6 0.7 - 4.0 K/uL   Monocytes Relative 6 %   Monocytes Absolute 0.5 0.1 - 1.0 K/uL   Eosinophils Relative 0 %   Eosinophils Absolute 0.0 0.0 - 0.5 K/uL   Basophils Relative 0 %   Basophils Absolute 0.0 0.0 - 0.1 K/uL   Immature Granulocytes 0 %   Abs Immature Granulocytes 0.03 0.00 - 0.07 K/uL  Protime-INR  Result Value Ref Range   Prothrombin Time 12.6 11.4 - 15.2 seconds   INR 1.0 0.8 - 1.2    Chest 2 View  Result Date: 08/30/2019 CLINICAL DATA:  Preoperative study for cervical fusion. EXAM: CHEST - 2 VIEW COMPARISON:  None. FINDINGS: The heart size and mediastinal contours are within normal limits. Normal pulmonary vascularity. No focal consolidation, pleural effusion, or pneumothorax. No acute osseous abnormality. IMPRESSION: No active cardiopulmonary disease. Electronically Signed   By: Titus Dubin M.D.   On: 08/30/2019 12:22   DG Cervical Spine 2-3 Views  Result Date: 08/30/2019 CLINICAL DATA:  C-spine  fusion EXAM: CERVICAL SPINE - 2-3 VIEW; DG C-ARM 1-60 MIN COMPARISON:  MRI 08/26/2019 FINDINGS: Three low resolution intraoperative spot views of the cervical spine. Total fluoroscopy time was 7 seconds. Initial image demonstrates localizing instrument anterior to C4. Subsequent images demonstrate placement of surgical plate and fixating screws with interbody device at C3-C4. IMPRESSION: Intraoperative fluoroscopic assistance provided during cervical spine surgery. Electronically Signed   By: Jasmine Pang M.D.   On: 08/30/2019 16:48   MR Brain Wo Contrast  Result Date: 08/10/2019 CLINICAL DATA:  Numbness in the right shoulder with weakness in the hands. EXAM: MRI HEAD WITHOUT CONTRAST TECHNIQUE: Multiplanar, multiecho pulse sequences of the brain and surrounding structures were obtained without  intravenous contrast. COMPARISON:  None. FINDINGS: Brain: No acute or subacute infarction, hemorrhage, hydrocephalus, extra-axial collection or mass lesion. Mild FLAIR hyperintensity in the cerebral white matter with patchy nonspecific appearance. Brain volume is normal Vascular: Normal flow voids Skull and upper cervical spine: It is normal marrow signal Sinuses/Orbits: Ovoid heterogeneous signal the level of the sphenoid inter sinus septum or left sphenoid sinus which measures 2.5 x 1.8 cm. IMPRESSION: 1. No specific cause of symptoms and no reversible finding in the brain. 2. Mild white matter disease usually related to chronic small vessel ischemia. No typical demyelinating pattern. 3. 2.5 x 1.8 cm sphenoid sinus abnormality which could be intersinus septal bone lesion (such as fibrous dysplasia), soft tissue mass, or trapped debris. Postcontrast imaging may be required, but first recommend sinus CT. Electronically Signed   By: Marnee Spring M.D.   On: 08/10/2019 05:48   MR CERVICAL SPINE WO CONTRAST  Result Date: 08/26/2019 CLINICAL DATA:  Ataxia and right worse than left hand and leg weakness. History of a fall 2 weeks ago. EXAM: MRI CERVICAL SPINE WITHOUT CONTRAST TECHNIQUE: Multiplanar, multisequence MR imaging of the cervical spine was performed. No intravenous contrast was administered. COMPARISON:  None. FINDINGS: Alignment: Maintained. There is straightening of the normal cervical lordosis. Vertebrae: No fracture, evidence of discitis, or bone lesion. Endplate edema is seen eccentric to the right at C3-4. Multilevel degenerative endplate signal change noted. Cord: Hazy increased T2 signal is seen within the cord at the C3-4 level. No hematoma. Posterior Fossa, vertebral arteries, paraspinal tissues: Normal. Disc levels: C2-3: Facet arthropathy causes mild to moderate left foraminal narrowing. There is a shallow disc bulge but the central canal is open. The right foramen is patent. C3-4: Loss of  disc space height with a broad-based disc bulge. Uncovertebral spurring and bilateral facet degenerative change. There is marrow edema in the right facets. The cord is flattened. Severe bilateral foraminal narrowing. C4-5: Right worse than left facet degenerative change. There is a shallow disc bulge and uncovertebral spurring. The ventral thecal sac is effaced. Moderately severe to severe bilateral foraminal narrowing is worse on the right. C5-6: Bilateral facet degenerative change, shallow disc bulge and uncovertebral spurring. The central canal is open. Moderately severe to severe bilateral foraminal narrowing appears symmetric. C6-7: Loss of disc space height with a shallow bulge, uncovertebral disease and facet arthropathy. The ventral thecal sac is effaced. Severe bilateral foraminal narrowing. C7-T1: Advanced bilateral facet degenerative change. The disc is uncovered with a shallow bulge. The central canal is open. Moderately severe to severe foraminal narrowing appears worse on the left. IMPRESSION: Hazy edema diffusely within the cord at C3-4 due to compression by a broad-based disc bulge is worrisome for cord ischemia or possibly contusion given history of fall. There is advanced bilateral facet degenerative  change at C3-4 with marrow edema in the right facets. Severe bilateral foraminal narrowing is present. The ventral thecal sac is effaced at C4-5 where there is moderately severe to severe bilateral foraminal narrowing which is worse on the right. Moderately severe to severe bilateral foraminal narrowing C5-6. Severe bilateral foraminal narrowing C6-7. Moderately severe to severe bilateral foraminal narrowing at C7-T1 is worse on the right. Critical Value/emergent results were called by telephone at the time of interpretation on 08/26/2019 at 3:32 pm to provider [facility] , who verbally acknowledged these results. Electronically Signed   By: Drusilla Kanner M.D.   On: 08/26/2019 15:40   CT  Maxillofacial W/Cm  Result Date: 08/25/2019 CLINICAL DATA:  Abnormal finding in the sphenoid sinus on brain MRI. EXAM: CT MAXILLOFACIAL WITH CONTRAST TECHNIQUE: Multidetector CT imaging of the maxillofacial structures was performed with intravenous contrast. Multiplanar CT image reconstructions were also generated. CONTRAST:  24mL ISOVUE-300 IOPAMIDOL (ISOVUE-300) INJECTION 61% COMPARISON:  MRI of the brain Aug 09, 2019 FINDINGS: Osseous: Expansile lesion of the septum of the sphenoid sinus with peripheral ground glass opacity with central hypodensity and scattered sclerotic foci. The ground-glass opacity extends into the superior aspect of the clivus. 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 Periapical lucency at the second mandibular premolar tooth on the left and first maxillary premolar tooth on the right. Bowing of the nasal septum to the right with a bony spur contacting the inferior turbinate. Orbits: Negative. No traumatic or inflammatory finding. Sinuses: Paranasal sinus are clear. Soft tissues: Moderate amount of cerumen in the right external auditory canal. Limited intracranial: No significant or unexpected finding. IMPRESSION: 1. 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 2. Periapical lucency at the second mandibular premolar tooth on the left and first maxillary premolar tooth on the right. 3. Bowing of the nasal septum to the right with a bony spur contacting the inferior turbinate. 4. Moderate amount of cerumen in the right external auditory canal. Electronically Signed   By: Baldemar Lenis M.D.   On: 08/25/2019 16:38   DG C-Arm 1-60 Min  Result Date: 08/30/2019 CLINICAL DATA:  C-spine fusion EXAM: CERVICAL SPINE - 2-3 VIEW; DG C-ARM 1-60 MIN COMPARISON:  MRI 08/26/2019 FINDINGS: Three low resolution intraoperative spot views  of the cervical spine. Total fluoroscopy time was 7 seconds. Initial image demonstrates localizing instrument anterior to C4. Subsequent images demonstrate placement of surgical plate and fixating screws with interbody device at C3-C4. IMPRESSION: Intraoperative fluoroscopic assistance provided during cervical spine surgery. Electronically Signed   By: Jasmine Pang M.D.   On: 08/30/2019 16:48    Antibiotics:  Anti-infectives (From admission, onward)   Start     Dose/Rate Route Frequency Ordered Stop   08/30/19 2100  ceFAZolin (ANCEF) IVPB 2g/100 mL premix     2 g 200 mL/hr over 30 Minutes Intravenous Every 8 hours 08/30/19 1549 08/31/19 0356   08/30/19 1403  bacitracin 50,000 Units in sodium chloride 0.9 % 500 mL irrigation  Status:  Discontinued       As needed 08/30/19 1404 08/30/19 1456   08/30/19 1200  ceFAZolin (ANCEF) IVPB 2g/100 mL premix     2 g 200 mL/hr over 30 Minutes Intravenous On call to O.R. 08/30/19 1152 08/30/19 1300   08/30/19 1155  ceFAZolin (ANCEF) 2-4 GM/100ML-% IVPB    Note to Pharmacy: Shireen Quan   : cabinet  override      08/30/19 1155 08/30/19 1321      Discharge Exam: Blood pressure (!) 139/100, pulse (!) 103, temperature 98.5 F (36.9 C), temperature source Oral, resp. rate 17, height 5\' 11"  (1.803 m), weight 81.6 kg, SpO2 99 %. Neurologic: Grossly normal with 4/5 strength in the hands right leg with spastic gait and hyperreflexia Dressing clean dry and intact  Discharge Medications:   Allergies as of 08/31/2019      Reactions   Sulfur       Medication List    STOP taking these medications   methylPREDNISolone 4 MG Tbpk tablet Commonly known as: MEDROL DOSEPAK     TAKE these medications   acetaminophen 650 MG CR tablet Commonly known as: TYLENOL Take 1,300 mg by mouth every 8 (eight) hours as needed for pain.   atorvastatin 10 MG tablet Commonly known as: LIPITOR Take 1 tablet (10 mg total) by mouth daily.   bisacodyl 5 MG EC  tablet Commonly known as: DULCOLAX Take 5 mg by mouth See admin instructions. Take 5 mg daily, may take a second 5 mg dose as needed for constipation   carvedilol 6.25 MG tablet Commonly known as: COREG Take 1 tablet (6.25 mg total) by mouth 2 (two) times daily with a meal.   Edarbi 40 MG Tabs Generic drug: Azilsartan Medoxomil Take 1 tablet by mouth daily. What changed: when to take this   fexofenadine 180 MG tablet Commonly known as: ALLEGRA Take 180 mg by mouth daily.   ibuprofen 200 MG tablet Commonly known as: ADVIL Take 400 mg by mouth every 6 (six) hours as needed (back ache).   indapamide 1.25 MG tablet Commonly known as: LOZOL Take 1 tablet (1.25 mg total) by mouth daily.   methocarbamol 500 MG tablet Commonly known as: ROBAXIN Take 1 tablet (500 mg total) by mouth every 8 (eight) hours as needed for muscle spasms.   multivitamin with minerals Tabs tablet Take 1 tablet by mouth daily. Centrum Silver   oxyCODONE 5 MG immediate release tablet Commonly known as: Oxy IR/ROXICODONE Take 1 tablet (5 mg total) by mouth every 4 (four) hours as needed for moderate pain ((score 4 to 6)).   triamcinolone cream 0.1 % Commonly known as: KENALOG Apply 1 application topically 2 (two) times daily as needed (skin irritation/rash (hands)).       Disposition: Home   Final Dx: ACDF C3-4 for cervical spondylitic myelopathy  Discharge Instructions     Remove dressing in 72 hours   Complete by: As directed    Call MD for:  difficulty breathing, headache or visual disturbances   Complete by: As directed    Call MD for:  persistant nausea and vomiting   Complete by: As directed    Call MD for:  redness, tenderness, or signs of infection (pain, swelling, redness, odor or green/yellow discharge around incision site)   Complete by: As directed    Call MD for:  severe uncontrolled pain   Complete by: As directed    Call MD for:  temperature >100.4   Complete by: As directed     Diet - low sodium heart healthy   Complete by: As directed    Increase activity slowly   Complete by: As directed          Signed: 09/02/2019 08/31/2019, 7:53 AM

## 2019-08-31 NOTE — Telephone Encounter (Signed)
Pt was on TCM report admitted 08/30/19 for Cervical spondylitic myelopathy. Patient underwent ACDF C3-4. The patient tolerated the procedure well and was D/C 08/31/19 will f/u w/ surgeon.Larry KitchenRaechel Chute

## 2019-10-04 ENCOUNTER — Encounter: Payer: Self-pay | Admitting: Internal Medicine

## 2019-10-04 ENCOUNTER — Other Ambulatory Visit: Payer: Self-pay

## 2019-10-04 ENCOUNTER — Ambulatory Visit (INDEPENDENT_AMBULATORY_CARE_PROVIDER_SITE_OTHER): Payer: Self-pay | Admitting: Internal Medicine

## 2019-10-04 VITALS — BP 134/86 | HR 66 | Temp 97.7°F | Resp 16 | Ht 71.0 in | Wt 183.0 lb

## 2019-10-04 DIAGNOSIS — I10 Essential (primary) hypertension: Secondary | ICD-10-CM

## 2019-10-04 DIAGNOSIS — T502X5A Adverse effect of carbonic-anhydrase inhibitors, benzothiadiazides and other diuretics, initial encounter: Secondary | ICD-10-CM

## 2019-10-04 DIAGNOSIS — E876 Hypokalemia: Secondary | ICD-10-CM

## 2019-10-04 LAB — BASIC METABOLIC PANEL
BUN: 19 mg/dL (ref 6–23)
CO2: 31 mEq/L (ref 19–32)
Calcium: 10.1 mg/dL (ref 8.4–10.5)
Chloride: 103 mEq/L (ref 96–112)
Creatinine, Ser: 0.78 mg/dL (ref 0.40–1.50)
GFR: 120.52 mL/min (ref >60.00)
Glucose, Bld: 94 mg/dL (ref 70–99)
Potassium: 3.8 mEq/L (ref 3.5–5.1)
Sodium: 140 mEq/L (ref 135–145)

## 2019-10-04 LAB — MAGNESIUM: Magnesium: 2.2 mg/dL (ref 1.5–2.5)

## 2019-10-04 NOTE — Patient Instructions (Signed)

## 2019-10-04 NOTE — Progress Notes (Signed)
Subjective:  Patient ID: Larry Blair, male    DOB: 14-Sep-1955  Age: 64 y.o. 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  His strength and use of his extremities is improving.  He is walking independently.  His weakness and paresthesias are improving.  At the time of discharge his potassium level was low.  He says his blood pressure has been well controlled and he denies any recent episodes of headache, blurred vision, chest pain, shortness of breath, DOE, palpitations, edema, or fatigue.  Outpatient Medications Prior to Visit  Medication Sig Dispense Refill  . acetaminophen (TYLENOL) 650 MG CR tablet Take 1,300 mg by mouth every 8 (eight) hours as needed for pain.    Marland Kitchen atorvastatin (LIPITOR) 10 MG tablet Take 1 tablet (10 mg total) by mouth daily. 90 tablet 1  . Baclofen 5 MG TABS Take 5 mg by mouth in the morning, at noon, and at bedtime. 5 mg tab in the morning and afternoon. 10 mg in the evening.    . bisacodyl (DULCOLAX) 5 MG EC tablet Take 5 mg by mouth See admin instructions. Take 5 mg daily, may take a second 5 mg dose as needed for constipation    . carvedilol (COREG) 6.25 MG tablet Take 1 tablet (6.25 mg total) by mouth 2 (two) times daily with a meal. 180 tablet 1  . fexofenadine (ALLEGRA) 180 MG tablet Take 180 mg by mouth daily.    . indapamide (LOZOL) 1.25 MG tablet Take 1 tablet (1.25 mg total) by mouth daily. 90 tablet 0  . Multiple Vitamin (MULTIVITAMIN WITH MINERALS) TABS tablet Take 1 tablet by mouth daily. Centrum Silver    . triamcinolone cream (KENALOG) 0.1 % Apply 1 application topically 2 (two) times daily as needed (skin irritation/rash (hands)).    Marland Kitchen ibuprofen (ADVIL) 200 MG tablet Take 400 mg by mouth every 6 (six) hours as needed (back ache).    . methocarbamol (ROBAXIN) 500 MG tablet Take 1 tablet (500 mg total) by mouth every  8 (eight) hours as needed for muscle spasms. 30 tablet 1  . Azilsartan Medoxomil (EDARBI) 40 MG TABS Take 1 tablet by mouth daily. (Patient taking differently: Take 1 tablet by mouth in the morning. ) 56 tablet 0  . oxyCODONE (OXY IR/ROXICODONE) 5 MG immediate release tablet Take 1 tablet (5 mg total) by mouth every 4 (four) hours as needed for moderate pain ((score 4 to 6)). (Patient not taking: Reported on 10/04/2019) 30 tablet 0   No facility-administered medications prior to visit.    ROS Review of Systems  Constitutional: Negative.  Negative for chills, diaphoresis, fatigue and unexpected weight change.  HENT: Negative.  Negative for trouble swallowing.   Eyes: Negative.   Respiratory: Negative for cough, chest tightness, shortness of breath and wheezing.   Gastrointestinal: Negative for abdominal pain, diarrhea, nausea and vomiting.  Endocrine: Negative.   Genitourinary: Negative.  Negative for difficulty urinating.  Musculoskeletal: Positive for gait problem. Negative for arthralgias and neck pain.  Skin: Negative.   Neurological: Positive for weakness. Negative for dizziness, speech difficulty and numbness.  Hematological: Negative for adenopathy. Does not bruise/bleed easily.  Psychiatric/Behavioral: Negative.     Objective:  BP 134/86 (BP Location: Left Arm, Patient Position: Sitting, Cuff Size: Normal)   Pulse 66   Temp 97.7 F (36.5 C) (Oral)   Resp 16  Ht 5\' 11"  (1.803 m)   Wt 183 lb (83 kg)   SpO2 94%   BMI 25.52 kg/m   BP Readings from Last 3 Encounters:  10/04/19 134/86  08/31/19 (!) 139/100  08/26/19 131/87    Wt Readings from Last 3 Encounters:  10/04/19 183 lb (83 kg)  08/30/19 180 lb (81.6 kg)  08/26/19 181 lb (82.1 kg)    Physical Exam Vitals reviewed.  Constitutional:      Appearance: Normal appearance.  HENT:     Nose: Nose normal.     Mouth/Throat:     Mouth: Mucous membranes are moist.  Eyes:     General: No scleral icterus.     Conjunctiva/sclera: Conjunctivae normal.  Cardiovascular:     Rate and Rhythm: Normal rate and regular rhythm.     Heart sounds: No murmur heard.   Pulmonary:     Effort: Pulmonary effort is normal.     Breath sounds: No stridor. No wheezing, rhonchi or rales.  Abdominal:     General: Abdomen is flat.     Palpations: There is no mass.     Tenderness: There is no abdominal tenderness. There is no guarding.  Musculoskeletal:        General: Normal range of motion.     Cervical back: Neck supple.     Right lower leg: No edema.     Left lower leg: No edema.  Lymphadenopathy:     Cervical: No cervical adenopathy.  Skin:    General: Skin is warm and dry.  Neurological:     Mental Status: He is alert and oriented to person, place, and time.     Cranial Nerves: Cranial nerves are intact.     Sensory: Sensation is intact.     Motor: Weakness present. No atrophy or abnormal muscle tone.     Coordination: Romberg sign negative. Coordination abnormal.     Gait: Gait abnormal.     Deep Tendon Reflexes:     Reflex Scores:      Tricep reflexes are 1+ on the right side and 1+ on the left side.      Bicep reflexes are 1+ on the right side and 2+ on the left side.      Brachioradialis reflexes are 1+ on the right side and 2+ on the left side.      Patellar reflexes are 0 on the right side and 0 on the left side.      Achilles reflexes are 0 on the right side and 0 on the left side.    Comments: Mild weakness in the RLE     Lab Results  Component Value Date   WBC 8.3 08/30/2019   HGB 13.7 08/30/2019   HCT 42.4 08/30/2019   PLT 208 08/30/2019   GLUCOSE 94 10/04/2019   CHOL 193 07/12/2019   TRIG 81.0 07/12/2019   HDL 71.60 07/12/2019   LDLCALC 105 (H) 07/12/2019   ALT 23 07/12/2019   AST 20 07/12/2019   NA 140 10/04/2019   K 3.8 10/04/2019   CL 103 10/04/2019   CREATININE 0.78 10/04/2019   BUN 19 10/04/2019   CO2 31 10/04/2019   TSH 2.85 07/12/2019   PSA 1.47 07/12/2019   INR  1.0 08/30/2019    No results found.  Assessment & Plan:   Larry Blair was seen today for hypertension.  Diagnoses and all orders for this visit:  Essential hypertension- His BP is well controlled. Lytes and renal function are normal. -  Magnesium; Future -     Basic metabolic panel; Future -     Azilsartan Medoxomil (EDARBI) 40 MG TABS; Take 1 tablet by mouth daily. -     Basic metabolic panel -     Magnesium  Diuretic-induced hypokalemia -     Magnesium; Future -     Basic metabolic panel; Future -     Basic metabolic panel -     Magnesium   I have discontinued Larry Blair's ibuprofen, methocarbamol, and oxyCODONE. I am also having him maintain his fexofenadine, carvedilol, atorvastatin, indapamide, multivitamin with minerals, triamcinolone cream, acetaminophen, bisacodyl, Baclofen, and Edarbi.  Meds ordered this encounter  Medications  . Azilsartan Medoxomil (EDARBI) 40 MG TABS    Sig: Take 1 tablet by mouth daily.    Dispense:  30 tablet    Refill:  5     Follow-up: Return in about 6 months (around 04/04/2020).  Scarlette Calico, MD

## 2019-10-05 MED ORDER — EDARBI 40 MG PO TABS: 1.0000 | ORAL_TABLET | Freq: Every day | ORAL | 5 refills | Status: DC

## 2019-10-16 ENCOUNTER — Other Ambulatory Visit: Payer: Self-pay | Admitting: Internal Medicine

## 2019-10-16 DIAGNOSIS — I10 Essential (primary) hypertension: Secondary | ICD-10-CM

## 2019-11-09 ENCOUNTER — Encounter: Payer: Self-pay | Admitting: Physical Medicine & Rehabilitation

## 2019-11-30 ENCOUNTER — Encounter: Payer: Self-pay | Admitting: Physical Medicine & Rehabilitation

## 2019-12-23 ENCOUNTER — Encounter: Payer: Self-pay | Admitting: Physical Medicine & Rehabilitation

## 2020-01-04 ENCOUNTER — Other Ambulatory Visit: Payer: Self-pay

## 2020-01-04 ENCOUNTER — Encounter: Payer: Self-pay | Attending: Physical Medicine & Rehabilitation | Admitting: Physical Medicine & Rehabilitation

## 2020-01-04 ENCOUNTER — Encounter: Payer: Self-pay | Admitting: Physical Medicine & Rehabilitation

## 2020-01-04 VITALS — BP 164/98 | HR 65 | Temp 98.4°F | Ht 71.0 in | Wt 189.0 lb

## 2020-01-04 DIAGNOSIS — M4712 Other spondylosis with myelopathy, cervical region: Secondary | ICD-10-CM | POA: Insufficient documentation

## 2020-01-04 NOTE — Progress Notes (Signed)
Subjective:    Patient ID: Larry Blair, male    DOB: [birth year], 64 y.o.   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  He was evaluated by primary care and referred to neurology.  MRI of the brain did not show any evidence of CVA.  Neurology ordered MRI of the cervical spine demonstrating cord contusion versus cord edema at C3-C4.  The patient was referred to neurosurgery, Dr. Marikay Alar.  Cervical myelopathy was diagnosed, underwent decompression on 08/30/2019 Residual pain and weakness in R>L  Hand No longer able to manipulate objects  Can pull pop top now which he could not do initially.  The patient is no longer able to work.  His pain which is mainly in shoulders and hand is not very severe.  He rates it as a 3/10  Pain Inventory Average Pain 3 Pain Right Now 3 My pain is intermittent and aching  In the last 24 hours, has pain interfered with the following? General activity 7 Relation with others 0 Enjoyment of life 0 What TIME of day is your pain at its worst? morning  and evening Sleep (in general) Good  Pain is worse with: walking, standing and some activites Pain improves with: rest Relief from Meds: 7  use a cane how many minutes can you walk? 10-15 ability to climb steps?  yes do you drive?  yes  retired  bladder control problems weakness numbness trouble walking spasms  New pt  New pt    Family History  Problem Relation Age of Onset  . Colon cancer Father   . Early death Brother   . Kidney failure Brother   . Cancer Maternal Grandmother   . Cancer Maternal Grandfather   . Early death Sister   . Kidney failure Sister   . Tongue cancer Maternal Aunt   . Pancreatic cancer Maternal Aunt   . Colon polyps Neg Hx   . Esophageal cancer Neg Hx   . Stomach  cancer Neg Hx   . Rectal cancer Neg Hx    Social History   Socioeconomic History  . Marital status: Divorced    Spouse name: Not on file  . Number of children: Not on file  . Years of education: Not on file  . Highest education level: Not on file  Occupational History  . Not on file  Tobacco Use  . Smoking status: Former Smoker    Types: Cigarettes, Cigars    Quit date: 06/30/2012    Years since quitting: 7.5  . Smokeless tobacco: Former Neurosurgeon    Types: Chew    Quit date: 07/01/1987  Vaping Use  . Vaping Use: Never used  Substance and Sexual Activity  . Alcohol use: Yes    Alcohol/week: 4.0 standard drinks    Types: 4 Shots of liquor per week    Comment: + occasional beer  . Drug use: Yes    Frequency: 7.0 times per week    Types: Marijuana    Comment: "several times a day"  . Sexual activity: Yes    Partners: Female  Other Topics Concern  . Not on file  Social History Narrative   Lives with spouse    Right hand   Social Determinants of Health   Financial Resource Strain:   . Difficulty of Paying Living Expenses: Not on file  Food Insecurity:   . Worried About Programme researcher, broadcasting/film/video in the Last Year: Not on file  . Ran Out of Food in the Last Year: Not on file  Transportation Needs:   . Lack of Transportation (Medical): Not on file  . Lack of Transportation (Non-Medical): Not on file  Physical Activity:   . Days of Exercise per Week: Not on file  . Minutes of Exercise per Session: Not on file  Stress:   . Feeling of Stress : Not on file  Social Connections:   . Frequency of Communication with Friends and Family: Not on file  . Frequency of Social Gatherings with Friends and Family: Not on file  . Attends Religious Services: Not on file  . Active Member of Clubs or Organizations: Not on file  . Attends Banker Meetings: Not on file  . Marital Status: Not on file   Past Surgical History:  Procedure Laterality Date  . ANTERIOR CERVICAL  DECOMP/DISCECTOMY FUSION N/A 08/30/2019   Procedure: CERVICAL THREE-FOUR ANTERIOR CERVICAL DECOMPRESSION/FUSION;  Surgeon: Tia Alert, MD;  Location: [facility] OR;  Service: Neurosurgery;  Laterality: N/A;  . COLONOSCOPY    . KNEE ARTHROSCOPY Left    Past Medical History:  Diagnosis Date  . Allergy   . Arthritis    right shoulder  . Asthma   . Eczema   . HTN (hypertension)   . Hyperlipidemia   . Substance abuse (HCC) 1976   daily   BP (!) 164/98   Pulse 65   Temp 98.4 F (36.9 C)   Ht 5\' 11"  (1.803 m)   Wt 189 lb (85.7 kg)   SpO2 95%   BMI 26.36 kg/m   Opioid Risk Score:   Fall Risk Score:  `1  Depression screen PHQ 2/9  Depression screen [facility] 2/9 01/04/2020 08/26/2019 07/12/2019 06/30/2017  Decreased Interest 0 0 0 0  Down, Depressed, Hopeless 0 0 0 0  PHQ - 2 Score 0 0 0 0    Review of Systems  Musculoskeletal: Positive for back pain and gait problem.       Spasms Shoulder pain Leg pain Arm pain Hand pain  Neurological: Positive for weakness and numbness.       Objective:   Physical Exam Vitals and nursing note reviewed.  Constitutional:      Appearance: He is normal weight.  HENT:     Head: Normocephalic and atraumatic.  Eyes:     Extraocular Movements: Extraocular movements intact.     Conjunctiva/sclera: Conjunctivae normal.     Pupils: Pupils are equal, round, and reactive to light.  Cardiovascular:     Rate and Rhythm: Normal rate and regular rhythm.     Heart sounds: Normal heart sounds. No murmur heard.   Pulmonary:     Effort: Pulmonary effort is normal. No respiratory distress.     Breath sounds: Normal breath sounds. No stridor.  Abdominal:     General: Abdomen is flat. Bowel sounds are normal. There is no distension.     Palpations: Abdomen is soft. There is no mass.  Musculoskeletal:        General: Tenderness present.     Right lower leg: No edema.     Left lower leg: No edema.     Comments: Mild tenderness upper traps right greater than  left side  There is pain with overhead reaching in the right shoulder.  Skin:    General: Skin is warm and dry.  Coloration: Skin is not jaundiced.     Findings: No bruising.  Neurological:     Mental Status: He is alert and oriented to person, place, and time.     Cranial Nerves: No cranial nerve deficit or dysarthria.     Motor: Weakness, atrophy and abnormal muscle tone present.     Coordination: Coordination abnormal. Finger-Nose-Finger Test abnormal. Impaired rapid alternating movements.     Gait: Gait abnormal and tandem walk abnormal.     Comments: Patient has 5/5 strength bilateral deltoid bicep tricep 3 - at the finger flexors and finger extensors. 5/5 strength bilateral hip flexors knee extensors ankle dorsiflexors 4/5 strength in the hip extensors bilaterally  Gait is wide, ataxic unable to perform tandem gait.  Uses cane to ambulate    Sensation intact pinprick bilateral upper and lower limbs he does have a area of relatively decreased sensation left lateral leg  Neck has 75% range with flexion extension lateral bending and rotation without pain     Assessment & Plan:  #1.  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  Patient still has evidence of gait ataxia fine motor deficits in the hands as well as hip extensor weakness as well as right upper extremity greater than left upper extremity hand weakness. Overall he has made a good improvement with his functional status thus far however does have potential to make further gains in terms of balance coordination fine motor.  Will make referral to outpatient PT OT at [facility] regional In the meantime he can work on sit to stand from a low chair next to a counter 3 sets of 10 every other day Also to work on fine motor in the hands he can practice screwing and unscrewing nuts and bolts starting with larger bolts and working down towards smaller ones.  Currently neuropathic pain is not very severe if it worsens may consider  gabapentin Physical medicine rehab follow-up in 3 months

## 2020-01-04 NOTE — Patient Instructions (Signed)
Practice sit to stand from low chair 3 sets of 10 reps  Practice screwing and unscrewing nuts and bolts, starting with larger and working to smaller

## 2020-01-31 ENCOUNTER — Other Ambulatory Visit: Payer: Self-pay | Admitting: Internal Medicine

## 2020-01-31 DIAGNOSIS — E785 Hyperlipidemia, unspecified: Secondary | ICD-10-CM

## 2020-02-03 NOTE — Addendum Note (Signed)
Addended by: Erick Colace on: 02/03/2020 02:19 PM   Modules accepted: Orders

## 2020-02-10 ENCOUNTER — Other Ambulatory Visit: Payer: Self-pay

## 2020-02-10 ENCOUNTER — Ambulatory Visit (INDEPENDENT_AMBULATORY_CARE_PROVIDER_SITE_OTHER): Payer: Self-pay

## 2020-02-10 DIAGNOSIS — Z23 Encounter for immunization: Secondary | ICD-10-CM

## 2020-02-15 ENCOUNTER — Ambulatory Visit: Payer: Medicaid Other | Admitting: Occupational Therapy

## 2020-02-17 ENCOUNTER — Encounter: Payer: Medicaid Other | Admitting: Occupational Therapy

## 2020-02-18 ENCOUNTER — Other Ambulatory Visit: Payer: Self-pay | Admitting: Internal Medicine

## 2020-02-18 DIAGNOSIS — I10 Essential (primary) hypertension: Secondary | ICD-10-CM

## 2020-02-18 MED ORDER — CARVEDILOL 6.25 MG PO TABS: 6.2500 mg | ORAL_TABLET | Freq: Two times a day (BID) | ORAL | 1 refills | Status: DC

## 2020-02-25 ENCOUNTER — Ambulatory Visit: Payer: Medicaid Other

## 2020-02-28 ENCOUNTER — Encounter: Payer: Medicaid Other | Admitting: Occupational Therapy

## 2020-02-28 ENCOUNTER — Ambulatory Visit: Payer: Medicaid Other

## 2020-03-07 ENCOUNTER — Encounter: Payer: Self-pay | Admitting: Physical Medicine & Rehabilitation

## 2020-03-07 ENCOUNTER — Encounter: Payer: Medicaid Other | Admitting: Occupational Therapy

## 2020-03-09 ENCOUNTER — Ambulatory Visit: Payer: Medicaid Other

## 2020-03-14 ENCOUNTER — Ambulatory Visit: Payer: Medicaid Other

## 2020-03-14 ENCOUNTER — Encounter: Payer: Medicaid Other | Admitting: Occupational Therapy

## 2020-03-16 ENCOUNTER — Ambulatory Visit: Payer: Medicaid Other

## 2020-03-21 ENCOUNTER — Encounter: Payer: Medicaid Other | Admitting: Occupational Therapy

## 2020-03-21 ENCOUNTER — Ambulatory Visit: Payer: Medicaid Other

## 2020-03-28 ENCOUNTER — Encounter: Payer: Medicaid Other | Admitting: Occupational Therapy

## 2020-03-28 ENCOUNTER — Ambulatory Visit: Payer: Medicaid Other

## 2020-03-30 ENCOUNTER — Ambulatory Visit: Payer: Medicaid Other

## 2020-03-30 ENCOUNTER — Encounter: Payer: Medicaid Other | Admitting: Occupational Therapy

## 2020-04-04 ENCOUNTER — Other Ambulatory Visit: Payer: Self-pay

## 2020-04-04 ENCOUNTER — Ambulatory Visit: Payer: Self-pay | Admitting: Internal Medicine

## 2020-04-04 ENCOUNTER — Encounter: Payer: Self-pay | Admitting: Internal Medicine

## 2020-04-04 VITALS — BP 146/96 | HR 67 | Temp 98.0°F | Resp 16 | Ht 71.0 in | Wt 197.0 lb

## 2020-04-04 DIAGNOSIS — T502X5A Adverse effect of carbonic-anhydrase inhibitors, benzothiadiazides and other diuretics, initial encounter: Secondary | ICD-10-CM

## 2020-04-04 DIAGNOSIS — I10 Essential (primary) hypertension: Secondary | ICD-10-CM

## 2020-04-04 DIAGNOSIS — L309 Dermatitis, unspecified: Secondary | ICD-10-CM

## 2020-04-04 DIAGNOSIS — E876 Hypokalemia: Secondary | ICD-10-CM

## 2020-04-04 LAB — BASIC METABOLIC PANEL
BUN: 16 mg/dL (ref 6–23)
CO2: 30 mEq/L (ref 19–32)
Calcium: 9.5 mg/dL (ref 8.4–10.5)
Chloride: 102 mEq/L (ref 96–112)
Creatinine, Ser: 0.82 mg/dL (ref 0.40–1.50)
GFR: 91.58 mL/min (ref >60.00)
Glucose, Bld: 89 mg/dL (ref 70–99)
Potassium: 3.7 mEq/L (ref 3.5–5.1)
Sodium: 138 mEq/L (ref 135–145)

## 2020-04-04 LAB — MAGNESIUM: Magnesium: 2.1 mg/dL (ref 1.5–2.5)

## 2020-04-04 MED ORDER — CARVEDILOL 6.25 MG PO TABS: 6.2500 mg | ORAL_TABLET | Freq: Two times a day (BID) | ORAL | 1 refills | Status: DC

## 2020-04-04 MED ORDER — FLUOCINONIDE 0.05 % EX CREA: 1.0000 "application " | TOPICAL_CREAM | Freq: Two times a day (BID) | CUTANEOUS | 2 refills | Status: DC

## 2020-04-04 NOTE — Patient Instructions (Signed)

## 2020-04-04 NOTE — Progress Notes (Signed)
Subjective:  Patient ID: Larry Blair, male    DOB: [birth year]  Age: 64 y.o. MRN: [id number]  CC: Rash and Hypertension  This visit occurred during the SARS-CoV-2 public health emergency.  Safety protocols were in place, including screening questions prior to the visit, additional usage of staff PPE, and extensive cleaning of exam room while observing appropriate contact time as indicated for disinfecting solutions.    HPI Larry Blair presents for f/up -  1.  He complains of a chronic, recurrent, itchy, scaly rash on both hands.  He does not know what triggers this.  He has not gotten much symptom relief with triamcinolone.  2.  He tells me he recently ran out of carvedilol.  He has not been monitoring his blood pressure.  He denies headache, blurred vision, chest pain, shortness of breath, palpitations, edema, or fatigue.  Outpatient Medications Prior to Visit  Medication Sig Dispense Refill  . acetaminophen (TYLENOL) 650 MG CR tablet Take 1,300 mg by mouth every 8 (eight) hours as needed for pain.    Marland Kitchen atorvastatin (LIPITOR) 10 MG tablet Take 1 tablet by mouth once daily 90 tablet 1  . baclofen (LIORESAL) 10 MG tablet Take 10 mg by mouth 3 (three) times daily.    . Baclofen 5 MG TABS Take 5 mg by mouth in the morning, at noon, and at bedtime. 5 mg tab in the morning and afternoon. 10 mg in the evening.    . bisacodyl (DULCOLAX) 5 MG EC tablet Take 5 mg by mouth See admin instructions. Take 5 mg daily, may take a second 5 mg dose as needed for constipation    . fexofenadine (ALLEGRA) 180 MG tablet Take 180 mg by mouth daily.    . indapamide (LOZOL) 1.25 MG tablet Take 1 tablet by mouth once daily 90 tablet 1  . Multiple Vitamin (MULTIVITAMIN WITH MINERALS) TABS tablet Take 1 tablet by mouth daily. Centrum Silver    . carvedilol (COREG) 6.25 MG tablet Take 1 tablet (6.25 mg total) by mouth 2 (two) times daily with a meal. 180 tablet 1  . triamcinolone cream (KENALOG)  0.1 % Apply 1 application topically 2 (two) times daily as needed (skin irritation/rash (hands)).     No facility-administered medications prior to visit.    ROS Review of Systems  Constitutional: Negative.  Negative for appetite change, chills, diaphoresis, fatigue and fever.  HENT: Negative.   Eyes: Negative.   Respiratory: Negative for cough, chest tightness, shortness of breath and wheezing.   Cardiovascular: Negative for chest pain, palpitations and leg swelling.  Gastrointestinal: Negative for abdominal pain, constipation, diarrhea, nausea and vomiting.  Endocrine: Negative.   Genitourinary: Negative.  Negative for difficulty urinating.  Musculoskeletal: Negative for arthralgias and myalgias.  Skin: Positive for rash. Negative for color change.  Neurological: Negative.  Negative for dizziness and weakness.  Hematological: Negative for adenopathy. Does not bruise/bleed easily.  Psychiatric/Behavioral: Negative.     Objective:  BP (!) 146/96 (BP Location: Left Arm, Patient Position: Sitting, Cuff Size: Normal)   Pulse 67   Temp 98 F (36.7 C) (Oral)   Resp 16   Ht 5\' 11"  (1.803 m)   Wt 197 lb (89.4 kg)   SpO2 97%   BMI 27.48 kg/m   BP Readings from Last 3 Encounters:  04/04/20 (!) 146/96  01/04/20 (!) 164/98  10/04/19 134/86    Wt Readings from Last 3 Encounters:  04/04/20 197 lb (89.4  kg)  01/04/20 189 lb (85.7 kg)  10/04/19 183 lb (83 kg)    Physical Exam Vitals reviewed.  HENT:     Nose: Nose normal.     Mouth/Throat:     Mouth: Mucous membranes are moist.  Eyes:     General: No scleral icterus.    Conjunctiva/sclera: Conjunctivae normal.  Cardiovascular:     Rate and Rhythm: Normal rate and regular rhythm.     Pulses: Normal pulses.     Heart sounds: No murmur heard.   Pulmonary:     Effort: Pulmonary effort is normal.     Breath sounds: No stridor. No wheezing, rhonchi or rales.  Abdominal:     General: Abdomen is flat.     Palpations: There  is no mass.     Tenderness: There is no abdominal tenderness. There is no guarding.     Hernia: No hernia is present.  Musculoskeletal:        General: Normal range of motion.     Cervical back: Neck supple.     Right lower leg: No edema.     Left lower leg: No edema.  Lymphadenopathy:     Cervical: No cervical adenopathy.  Skin:    General: Skin is warm and dry.     Findings: Rash present.     Comments: There is scaling and peeling on the dorsal and volar surfaces of both hands with hyperkeratosis.  See photos.  Neurological:     General: No focal deficit present.     Mental Status: He is alert.  Psychiatric:        Mood and Affect: Mood normal.        Behavior: Behavior normal.     Lab Results  Component Value Date   WBC 8.3 08/30/2019   HGB 13.7 08/30/2019   HCT 42.4 08/30/2019   PLT 208 08/30/2019   GLUCOSE 89 04/04/2020   CHOL 193 07/12/2019   TRIG 81.0 07/12/2019   HDL 71.60 07/12/2019   LDLCALC 105 (H) 07/12/2019   ALT 23 07/12/2019   AST 20 07/12/2019   NA 138 04/04/2020   K 3.7 04/04/2020   CL 102 04/04/2020   CREATININE 0.82 04/04/2020   BUN 16 04/04/2020   CO2 30 04/04/2020   TSH 2.85 07/12/2019   PSA 1.47 07/12/2019   INR 1.0 08/30/2019    No results found.  Assessment & Plan:   Larry Blair was seen today for rash and hypertension.  Diagnoses and all orders for this visit:  Chronic eczema of hand- I recommended that he use a more potent steroid for this. -     fluocinonide cream (LIDEX) 0.05 %; Apply 1 application topically 2 (two) times daily.  Essential hypertension- His blood pressure is not adequately well controlled.  Will restart carvedilol. -     Basic metabolic panel; Future -     Magnesium; Future -     carvedilol (COREG) 6.25 MG tablet; Take 1 tablet (6.25 mg total) by mouth 2 (two) times daily with a meal. -     Magnesium -     Basic metabolic panel  Diuretic-induced hypokalemia- His potassium level is in the normal range now. -      Basic metabolic panel; Future -     Magnesium; Future -     Magnesium -     Basic metabolic panel   I have discontinued Larry Blair's triamcinolone. I am also having him start on fluocinonide cream. Additionally, I am having  him maintain his fexofenadine, multivitamin with minerals, acetaminophen, bisacodyl, Baclofen, indapamide, atorvastatin, baclofen, and carvedilol.  Meds ordered this encounter  Medications  . fluocinonide cream (LIDEX) 0.05 %    Sig: Apply 1 application topically 2 (two) times daily.    Dispense:  120 g    Refill:  2  . carvedilol (COREG) 6.25 MG tablet    Sig: Take 1 tablet (6.25 mg total) by mouth 2 (two) times daily with a meal.    Dispense:  180 tablet    Refill:  1     Follow-up: Return in about 6 months (around 10/03/2020).  Sanda Linger, MD

## 2020-04-06 ENCOUNTER — Ambulatory Visit: Payer: Medicaid Other

## 2020-08-10 ENCOUNTER — Other Ambulatory Visit: Payer: Self-pay | Admitting: Internal Medicine

## 2020-08-10 DIAGNOSIS — E785 Hyperlipidemia, unspecified: Secondary | ICD-10-CM

## 2020-08-17 ENCOUNTER — Other Ambulatory Visit: Payer: Self-pay | Admitting: Internal Medicine

## 2020-08-17 DIAGNOSIS — I10 Essential (primary) hypertension: Secondary | ICD-10-CM

## 2020-09-22 ENCOUNTER — Other Ambulatory Visit: Payer: Self-pay

## 2020-09-22 ENCOUNTER — Ambulatory Visit (INDEPENDENT_AMBULATORY_CARE_PROVIDER_SITE_OTHER): Payer: Medicaid Other | Admitting: *Deleted

## 2020-09-22 DIAGNOSIS — Z23 Encounter for immunization: Secondary | ICD-10-CM

## 2020-10-03 ENCOUNTER — Encounter: Payer: Self-pay | Admitting: Internal Medicine

## 2020-10-03 ENCOUNTER — Ambulatory Visit (INDEPENDENT_AMBULATORY_CARE_PROVIDER_SITE_OTHER): Payer: Medicaid Other | Admitting: Internal Medicine

## 2020-10-03 ENCOUNTER — Other Ambulatory Visit: Payer: Self-pay

## 2020-10-03 VITALS — BP 132/88 | HR 61 | Temp 98.5°F | Resp 16 | Ht 71.0 in | Wt 191.0 lb

## 2020-10-03 DIAGNOSIS — E785 Hyperlipidemia, unspecified: Secondary | ICD-10-CM

## 2020-10-03 DIAGNOSIS — N400 Enlarged prostate without lower urinary tract symptoms: Secondary | ICD-10-CM | POA: Insufficient documentation

## 2020-10-03 DIAGNOSIS — I10 Essential (primary) hypertension: Secondary | ICD-10-CM

## 2020-10-03 LAB — HEPATIC FUNCTION PANEL
ALT: 26 U/L (ref 0–53)
AST: 21 U/L (ref 0–37)
Albumin: 4.5 g/dL (ref 3.5–5.2)
Alkaline Phosphatase: 53 U/L (ref 39–117)
Bilirubin, Direct: 0.2 mg/dL (ref 0.0–0.3)
Total Bilirubin: 0.8 mg/dL (ref 0.2–1.2)
Total Protein: 7.1 g/dL (ref 6.0–8.3)

## 2020-10-03 LAB — URINALYSIS, ROUTINE W REFLEX MICROSCOPIC
Bilirubin Urine: NEGATIVE
Hgb urine dipstick: NEGATIVE
Ketones, ur: NEGATIVE
Leukocytes,Ua: NEGATIVE
Nitrite: NEGATIVE
RBC / HPF: NONE SEEN (ref 0–?)
Specific Gravity, Urine: 1.025 (ref 1.000–1.030)
Total Protein, Urine: NEGATIVE
Urine Glucose: NEGATIVE
Urobilinogen, UA: 0.2 (ref 0.0–1.0)
pH: 6 (ref 5.0–8.0)

## 2020-10-03 LAB — CBC WITH DIFFERENTIAL/PLATELET
Basophils Absolute: 0 10*3/uL (ref 0.0–0.1)
Basophils Relative: 0.4 % (ref 0.0–3.0)
Eosinophils Absolute: 0.1 10*3/uL (ref 0.0–0.7)
Eosinophils Relative: 1.7 % (ref 0.0–5.0)
HCT: 41.6 % (ref 39.0–52.0)
Hemoglobin: 13.9 g/dL (ref 13.0–17.0)
Lymphocytes Relative: 35.2 % (ref 12.0–46.0)
Lymphs Abs: 2.5 10*3/uL (ref 0.7–4.0)
MCHC: 33.4 g/dL (ref 30.0–36.0)
MCV: 98.1 fl (ref 78.0–100.0)
Monocytes Absolute: 0.7 10*3/uL (ref 0.1–1.0)
Monocytes Relative: 9.1 % (ref 3.0–12.0)
Neutro Abs: 3.9 10*3/uL (ref 1.4–7.7)
Neutrophils Relative %: 53.6 % (ref 43.0–77.0)
Platelets: 231 10*3/uL (ref 150.0–400.0)
RBC: 4.24 Mil/uL (ref 4.22–5.81)
RDW: 12.9 % (ref 11.5–15.5)
WBC: 7.2 10*3/uL (ref 4.0–10.5)

## 2020-10-03 LAB — LIPID PANEL
Cholesterol: 158 mg/dL (ref 0–200)
HDL: 65.5 mg/dL (ref >39.00)
LDL Cholesterol: 80 mg/dL (ref 0–99)
NonHDL: 92.8
Total CHOL/HDL Ratio: 2
Triglycerides: 64 mg/dL (ref 0.0–149.0)
VLDL: 12.8 mg/dL (ref 0.0–40.0)

## 2020-10-03 LAB — BASIC METABOLIC PANEL
BUN: 18 mg/dL (ref 6–23)
CO2: 33 mEq/L — ABNORMAL HIGH (ref 19–32)
Calcium: 9.9 mg/dL (ref 8.4–10.5)
Chloride: 99 mEq/L (ref 96–112)
Creatinine, Ser: 0.77 mg/dL (ref 0.40–1.50)
GFR: 93.01 mL/min (ref >60.00)
Glucose, Bld: 83 mg/dL (ref 70–99)
Potassium: 4 mEq/L (ref 3.5–5.1)
Sodium: 139 mEq/L (ref 135–145)

## 2020-10-03 LAB — TSH: TSH: 2.14 u[IU]/mL (ref 0.35–4.50)

## 2020-10-03 LAB — PSA: PSA: 1.31 ng/mL (ref 0.10–4.00)

## 2020-10-03 NOTE — Progress Notes (Signed)
Subjective:  Patient ID: Larry Blair, male    DOB: [birth year]  Age: 65 y.o. MRN: [id number]  CC: Hypertension and Hyperlipidemia  This visit occurred during the SARS-CoV-2 public health emergency.  Safety protocols were in place, including screening questions prior to the visit, additional usage of staff PPE, and extensive cleaning of exam room while observing appropriate contact time as indicated for disinfecting solutions.    HPI Harvin Konicek presents for f/up -  He is active and denies CP, DOE, diaphoresis, edema, palpitations, dizziness, lightheadedness, or presyncope.  Outpatient Medications Prior to Visit  Medication Sig Dispense Refill   acetaminophen (TYLENOL) 650 MG CR tablet Take 1,300 mg by mouth every 8 (eight) hours as needed for pain.     atorvastatin (LIPITOR) 10 MG tablet Take 1 tablet by mouth once daily 90 tablet 1   bisacodyl (DULCOLAX) 5 MG EC tablet Take 5 mg by mouth See admin instructions. Take 5 mg daily, may take a second 5 mg dose as needed for constipation     carvedilol (COREG) 6.25 MG tablet Take 1 tablet (6.25 mg total) by mouth 2 (two) times daily with a meal. 180 tablet 1   Docusate Sodium (COLACE PO) Take by mouth.     fexofenadine (ALLEGRA) 180 MG tablet Take 180 mg by mouth daily.     fluocinonide cream (LIDEX) 0.05 % Apply 1 application topically 2 (two) times daily. 120 g 2   indapamide (LOZOL) 1.25 MG tablet Take 1 tablet by mouth once daily 90 tablet 0   Multiple Vitamin (MULTIVITAMIN WITH MINERALS) TABS tablet Take 1 tablet by mouth daily. Centrum Silver     baclofen (LIORESAL) 10 MG tablet Take 10 mg by mouth 3 (three) times daily. (Patient not taking: Reported on 10/03/2020)     Baclofen 5 MG TABS Take 5 mg by mouth in the morning, at noon, and at bedtime. 5 mg tab in the morning and afternoon. 10 mg in the evening. (Patient not taking: Reported on 10/03/2020)     No facility-administered medications prior to visit.    ROS Review of Systems   Constitutional:  Negative for diaphoresis and fatigue.  HENT: Negative.    Eyes: Negative.   Respiratory:  Negative for cough, chest tightness, shortness of breath and wheezing.   Cardiovascular:  Negative for chest pain, palpitations and leg swelling.  Gastrointestinal:  Negative for abdominal pain, constipation, diarrhea and vomiting.  Endocrine: Negative.   Genitourinary: Negative.  Negative for difficulty urinating, hematuria, testicular pain and urgency.  Musculoskeletal: Negative.   Skin: Negative.   Allergic/Immunologic: Negative.   Neurological: Negative.  Negative for dizziness and weakness.  Hematological:  Negative for adenopathy. Does not bruise/bleed easily.   Objective:  BP 132/88 (BP Location: Right Arm, Patient Position: Sitting, Cuff Size: Large)   Pulse 61   Temp 98.5 F (36.9 C) (Oral)   Resp 16   Ht 5\' 11"  (1.803 m)   Wt 191 lb (86.6 kg)   SpO2 97%   BMI 26.64 kg/m   BP Readings from Last 3 Encounters:  10/03/20 132/88  04/04/20 (!) 146/96  01/04/20 (!) 164/98    Wt Readings from Last 3 Encounters:  10/03/20 191 lb (86.6 kg)  04/04/20 197 lb (89.4 kg)  01/04/20 189 lb (85.7 kg)    Physical Exam Vitals reviewed.  Constitutional:      Appearance: Normal appearance.  HENT:     Nose: Nose normal.     Mouth/Throat:     Mouth:  Mucous membranes are moist.  Eyes:     General: No scleral icterus.    Conjunctiva/sclera: Conjunctivae normal.  Cardiovascular:     Rate and Rhythm: Regular rhythm. Bradycardia present.     Heart sounds: No murmur heard.    Comments: EKG- Sinus bradycardia, 57 bpm No LVH or Q waves Pulmonary:     Effort: Pulmonary effort is normal.     Breath sounds: No stridor. No wheezing, rhonchi or rales.  Abdominal:     General: Abdomen is flat.     Palpations: There is no mass.     Tenderness: There is no abdominal tenderness. There is no guarding.     Hernia: No hernia is present. There is no hernia in the left inguinal area  or right inguinal area.  Genitourinary:    Pubic Area: No rash.      Penis: Normal.      Testes: Normal.        Right: Mass, tenderness or swelling not present.        Left: Mass, tenderness or swelling not present.     Epididymis:     Right: Normal.     Left: Normal.     Prostate: Enlarged (1+ smooth symm BPH). Not tender and no nodules present.     Rectum: Normal. Guaiac result negative. No mass, tenderness, anal fissure, external hemorrhoid or internal hemorrhoid. Normal anal tone.  Musculoskeletal:        General: Normal range of motion.     Cervical back: Neck supple.     Right lower leg: No edema.     Left lower leg: No edema.  Lymphadenopathy:     Cervical: No cervical adenopathy.     Lower Body: No right inguinal adenopathy. No left inguinal adenopathy.  Skin:    General: Skin is warm and dry.     Findings: No rash.  Neurological:     General: No focal deficit present.     Mental Status: He is alert.  Psychiatric:        Mood and Affect: Mood normal.    Lab Results  Component Value Date   WBC 7.2 10/03/2020   HGB 13.9 10/03/2020   HCT 41.6 10/03/2020   PLT 231.0 10/03/2020   GLUCOSE 83 10/03/2020   CHOL 158 10/03/2020   TRIG 64.0 10/03/2020   HDL 65.50 10/03/2020   LDLCALC 80 10/03/2020   ALT 26 10/03/2020   AST 21 10/03/2020   NA 139 10/03/2020   K 4.0 10/03/2020   CL 99 10/03/2020   CREATININE 0.77 10/03/2020   BUN 18 10/03/2020   CO2 33 (H) 10/03/2020   TSH 2.14 10/03/2020   PSA 1.31 10/03/2020   INR 1.0 08/30/2019    No results found.  Assessment & Plan:   Elijan was seen today for hypertension and hyperlipidemia.  Diagnoses and all orders for this visit:  Essential hypertension- His BP is well controlled. -     CBC with Differential/Platelet; Future -     Basic metabolic panel; Future -     TSH; Future -     Urinalysis, Routine w reflex microscopic; Future -     EKG 12-Lead -     Urinalysis, Routine w reflex microscopic -     TSH -      Basic metabolic panel -     CBC with Differential/Platelet  Hyperlipidemia LDL goal <70- LDL goal achieved. Doing well on the statin  -     Lipid panel;  Future -     TSH; Future -     Hepatic function panel; Future -     Hepatic function panel -     TSH -     Lipid panel  Benign prostatic hyperplasia without lower urinary tract symptoms- His PSA is normal and he has no sx's related to this. -     PSA; Future -     Urinalysis, Routine w reflex microscopic; Future -     Urinalysis, Routine w reflex microscopic -     PSA  I am having [name] maintain his fexofenadine, multivitamin with minerals, acetaminophen, bisacodyl, Baclofen, baclofen, fluocinonide cream, carvedilol, atorvastatin, indapamide, and Docusate Sodium (COLACE PO).  No orders of the defined types were placed in this encounter.    Follow-up: Return in about 6 months (around 04/04/2021).  Sanda Linger, MD

## 2020-10-03 NOTE — Patient Instructions (Signed)

## 2020-11-06 ENCOUNTER — Other Ambulatory Visit: Payer: Self-pay | Admitting: Internal Medicine

## 2020-11-06 DIAGNOSIS — I10 Essential (primary) hypertension: Secondary | ICD-10-CM

## 2020-11-28 ENCOUNTER — Other Ambulatory Visit: Payer: Self-pay | Admitting: Internal Medicine

## 2020-11-28 DIAGNOSIS — I10 Essential (primary) hypertension: Secondary | ICD-10-CM

## 2020-11-29 ENCOUNTER — Ambulatory Visit: Payer: Medicaid Other

## 2021-02-07 ENCOUNTER — Other Ambulatory Visit: Payer: Self-pay | Admitting: Internal Medicine

## 2021-02-07 DIAGNOSIS — E785 Hyperlipidemia, unspecified: Secondary | ICD-10-CM

## 2021-02-13 ENCOUNTER — Ambulatory Visit (INDEPENDENT_AMBULATORY_CARE_PROVIDER_SITE_OTHER): Payer: Medicare HMO

## 2021-02-13 DIAGNOSIS — Z23 Encounter for immunization: Secondary | ICD-10-CM | POA: Diagnosis not present

## 2021-04-21 ENCOUNTER — Other Ambulatory Visit: Payer: Self-pay | Admitting: Internal Medicine

## 2021-04-21 DIAGNOSIS — L309 Dermatitis, unspecified: Secondary | ICD-10-CM

## 2021-05-05 ENCOUNTER — Other Ambulatory Visit: Payer: Self-pay | Admitting: Internal Medicine

## 2021-05-05 DIAGNOSIS — I10 Essential (primary) hypertension: Secondary | ICD-10-CM

## 2021-08-15 ENCOUNTER — Encounter: Payer: Self-pay | Admitting: Internal Medicine

## 2021-08-15 ENCOUNTER — Ambulatory Visit (INDEPENDENT_AMBULATORY_CARE_PROVIDER_SITE_OTHER): Payer: Medicare Other | Admitting: Internal Medicine

## 2021-08-15 VITALS — BP 138/86 | HR 66 | Temp 98.1°F | Resp 16 | Ht 71.0 in | Wt 192.0 lb

## 2021-08-15 DIAGNOSIS — I10 Essential (primary) hypertension: Secondary | ICD-10-CM | POA: Diagnosis not present

## 2021-08-15 DIAGNOSIS — E785 Hyperlipidemia, unspecified: Secondary | ICD-10-CM

## 2021-08-15 DIAGNOSIS — M545 Low back pain, unspecified: Secondary | ICD-10-CM

## 2021-08-15 DIAGNOSIS — J449 Chronic obstructive pulmonary disease, unspecified: Secondary | ICD-10-CM

## 2021-08-15 DIAGNOSIS — J4489 Other specified chronic obstructive pulmonary disease: Secondary | ICD-10-CM

## 2021-08-15 DIAGNOSIS — Z23 Encounter for immunization: Secondary | ICD-10-CM | POA: Insufficient documentation

## 2021-08-15 DIAGNOSIS — G8929 Other chronic pain: Secondary | ICD-10-CM

## 2021-08-15 LAB — CBC WITH DIFFERENTIAL/PLATELET
Basophils Absolute: 0 10*3/uL (ref 0.0–0.1)
Basophils Relative: 0.3 % (ref 0.0–3.0)
Eosinophils Absolute: 0.1 10*3/uL (ref 0.0–0.7)
Eosinophils Relative: 2.1 % (ref 0.0–5.0)
HCT: 41.6 % (ref 39.0–52.0)
Hemoglobin: 13.6 g/dL (ref 13.0–17.0)
Lymphocytes Relative: 34.8 % (ref 12.0–46.0)
Lymphs Abs: 2.2 10*3/uL (ref 0.7–4.0)
MCHC: 32.7 g/dL (ref 30.0–36.0)
MCV: 100.1 fl — ABNORMAL HIGH (ref 78.0–100.0)
Monocytes Absolute: 0.5 10*3/uL (ref 0.1–1.0)
Monocytes Relative: 8.5 % (ref 3.0–12.0)
Neutro Abs: 3.4 10*3/uL (ref 1.4–7.7)
Neutrophils Relative %: 54.3 % (ref 43.0–77.0)
Platelets: 206 10*3/uL (ref 150.0–400.0)
RBC: 4.16 Mil/uL — ABNORMAL LOW (ref 4.22–5.81)
RDW: 12.6 % (ref 11.5–15.5)
WBC: 6.3 10*3/uL (ref 4.0–10.5)

## 2021-08-15 LAB — BASIC METABOLIC PANEL
BUN: 25 mg/dL — ABNORMAL HIGH (ref 6–23)
CO2: 32 mEq/L (ref 19–32)
Calcium: 9.6 mg/dL (ref 8.4–10.5)
Chloride: 103 mEq/L (ref 96–112)
Creatinine, Ser: 0.99 mg/dL (ref 0.40–1.50)
GFR: 78.66 mL/min (ref >60.00)
Glucose, Bld: 96 mg/dL (ref 70–99)
Potassium: 4.1 mEq/L (ref 3.5–5.1)
Sodium: 140 mEq/L (ref 135–145)

## 2021-08-15 MED ORDER — ALBUTEROL SULFATE HFA 108 (90 BASE) MCG/ACT IN AERS: 2.0000 | INHALATION_SPRAY | Freq: Four times a day (QID) | RESPIRATORY_TRACT | 3 refills | Status: AC | PRN

## 2021-08-15 MED ORDER — ATORVASTATIN CALCIUM 10 MG PO TABS: 10.0000 mg | ORAL_TABLET | Freq: Every day | ORAL | 1 refills | Status: DC

## 2021-08-15 MED ORDER — BREZTRI AEROSPHERE 160-9-4.8 MCG/ACT IN AERO: 2.0000 | INHALATION_SPRAY | Freq: Two times a day (BID) | RESPIRATORY_TRACT | 1 refills | Status: DC

## 2021-08-15 MED ORDER — SHINGRIX 50 MCG/0.5ML IM SUSR: 0.5000 mL | Freq: Once | INTRAMUSCULAR | 1 refills | Status: AC

## 2021-08-15 MED ORDER — CARVEDILOL 6.25 MG PO TABS: 6.2500 mg | ORAL_TABLET | Freq: Two times a day (BID) | ORAL | 1 refills | Status: DC

## 2021-08-15 MED ORDER — INDAPAMIDE 1.25 MG PO TABS: 1.2500 mg | ORAL_TABLET | Freq: Every day | ORAL | 1 refills | Status: DC

## 2021-08-15 NOTE — Progress Notes (Signed)
? ?Subjective:  ?Patient ID: Larry Blair, male    DOB: [birth year]  Age: 66 y.o. MRN: [id number] ? ?CC: COPD, Hypertension, Asthma, and Back Pain ? ? ?HPI ?Larry Blair presents for f/up -  ? ?He says he has a of asthma and quit smoking about 8 years ago.  He complains of chronic cough productive of clear phlegm with shortness of breath and wheezing.  He is active and denies chest pain, diaphoresis, edema, fever, or chills. ? ?Outpatient Medications Prior to Visit  ?Medication Sig Dispense Refill  ? acetaminophen (TYLENOL) 650 MG CR tablet Take 1,300 mg by mouth every 8 (eight) hours as needed for pain.    ? bisacodyl (DULCOLAX) 5 MG EC tablet Take 5 mg by mouth See admin instructions. Take 5 mg daily, may take a second 5 mg dose as needed for constipation    ? fexofenadine (ALLEGRA) 180 MG tablet Take 180 mg by mouth daily.    ? fluocinonide cream (LIDEX) 0.05 % APPLY  CREAM EXTERNALLY TWICE DAILY 120 g 0  ? Multiple Vitamin (MULTIVITAMIN WITH MINERALS) TABS tablet Take 1 tablet by mouth daily. Centrum Silver    ? atorvastatin (LIPITOR) 10 MG tablet Take 1 tablet by mouth once daily 90 tablet 1  ? baclofen (LIORESAL) 10 MG tablet Take 10 mg by mouth 3 (three) times daily.    ? Baclofen 5 MG TABS Take 5 mg by mouth in the morning, at noon, and at bedtime. 5 mg tab in the morning and afternoon. 10 mg in the evening.    ? carvedilol (COREG) 6.25 MG tablet TAKE 1 TABLET BY MOUTH TWICE DAILY WITH A MEAL 180 tablet 0  ? Docusate Sodium (COLACE PO) Take by mouth.    ? indapamide (LOZOL) 1.25 MG tablet Take 1 tablet by mouth once daily 90 tablet 0  ? ?No facility-administered medications prior to visit.  ? ? ?ROS ?Review of Systems  ?Constitutional: Negative.  Negative for diaphoresis, fatigue and unexpected weight change.  ?HENT: Negative.    ?Eyes: Negative.   ?Respiratory:  Positive for cough, shortness of breath and wheezing. Negative for chest tightness and stridor.   ?Cardiovascular:  Negative for chest pain,  palpitations and leg swelling.  ?Gastrointestinal:  Negative for abdominal pain, constipation, diarrhea, nausea and vomiting.  ?Endocrine: Negative.   ?Genitourinary: Negative.  Negative for difficulty urinating.  ?Musculoskeletal:  Positive for arthralgias and back pain. Negative for myalgias.  ?     Chronic, unchanged low back pain.  ?Skin: Negative.   ?Neurological:  Positive for weakness. Negative for dizziness, light-headedness and headaches.  ?Hematological:  Negative for adenopathy. Does not bruise/bleed easily.  ?Psychiatric/Behavioral: Negative.    ? ?Objective:  ?BP 138/86 (BP Location: Left Arm)   Pulse 66   Temp 98.1 ?F (36.7 ?C) (Oral)   Resp 16   Ht 5\' 11"  (1.803 m)   Wt 192 lb (87.1 kg)   SpO2 95%   BMI 26.78 kg/m?  ? ?BP Readings from Last 3 Encounters:  ?08/15/21 138/86  ?10/03/20 132/88  ?04/04/20 (!) 146/96  ? ? ?Wt Readings from Last 3 Encounters:  ?08/15/21 192 lb (87.1 kg)  ?10/03/20 191 lb (86.6 kg)  ?04/04/20 197 lb (89.4 kg)  ? ? ?Physical Exam ?Vitals reviewed.  ?HENT:  ?   Nose: Nose normal.  ?   Mouth/Throat:  ?   Mouth: Mucous membranes are moist.  ?Eyes:  ?   General: No scleral icterus. ?   Conjunctiva/sclera: Conjunctivae normal.  ?  Cardiovascular:  ?   Rate and Rhythm: Normal rate and regular rhythm.  ?   Heart sounds: No murmur heard. ?Pulmonary:  ?   Effort: Pulmonary effort is normal. No tachypnea or respiratory distress.  ?   Breath sounds: No stridor. Examination of the right-middle field reveals wheezing and rhonchi. Examination of the left-middle field reveals wheezing and rhonchi. Examination of the right-lower field reveals wheezing and rhonchi. Examination of the left-lower field reveals wheezing and rhonchi. Wheezing and rhonchi present. No decreased breath sounds or rales.  ?Abdominal:  ?   General: Abdomen is flat.  ?   Palpations: There is no mass.  ?   Tenderness: There is no abdominal tenderness. There is no guarding.  ?   Hernia: No hernia is present.   ?Musculoskeletal:     ?   General: Normal range of motion.  ?   Cervical back: Neck supple.  ?   Right lower leg: No edema.  ?   Left lower leg: No edema.  ?Lymphadenopathy:  ?   Cervical: No cervical adenopathy.  ?Skin: ?   General: Skin is warm and dry.  ?Neurological:  ?   Mental Status: He is alert. Mental status is at baseline.  ?   Motor: Weakness present.  ?   Gait: Gait abnormal.  ?Psychiatric:     ?   Mood and Affect: Mood normal.     ?   Thought Content: Thought content normal.  ? ? ?Lab Results  ?Component Value Date  ? WBC 6.3 08/15/2021  ? HGB 13.6 08/15/2021  ? HCT 41.6 08/15/2021  ? PLT 206.0 08/15/2021  ? GLUCOSE 96 08/15/2021  ? CHOL 158 10/03/2020  ? TRIG 64.0 10/03/2020  ? HDL 65.50 10/03/2020  ? LDLCALC 80 10/03/2020  ? ALT 26 10/03/2020  ? AST 21 10/03/2020  ? NA 140 08/15/2021  ? K 4.1 08/15/2021  ? CL 103 08/15/2021  ? CREATININE 0.99 08/15/2021  ? BUN 25 (H) 08/15/2021  ? CO2 32 08/15/2021  ? TSH 2.14 10/03/2020  ? PSA 1.31 10/03/2020  ? INR 1.0 08/30/2019  ? ? ?Chest 2 View ? ?Result Date: 08/30/2019 ?CLINICAL DATA:  Preoperative study for cervical fusion. EXAM: CHEST - 2 VIEW COMPARISON:  None. FINDINGS: The heart size and mediastinal contours are within normal limits. Normal pulmonary vascularity. No focal consolidation, pleural effusion, or pneumothorax. No acute osseous abnormality. IMPRESSION: No active cardiopulmonary disease. Electronically Signed   By: Obie DredgeWilliam T Derry M.D.   On: 08/30/2019 12:22  ? ?DG Cervical Spine 2-3 Views ? ?Result Date: 08/30/2019 ?CLINICAL DATA:  C-spine fusion EXAM: CERVICAL SPINE - 2-3 VIEW; DG C-ARM 1-60 MIN COMPARISON:  MRI 08/26/2019 FINDINGS: Three low resolution intraoperative spot views of the cervical spine. Total fluoroscopy time was 7 seconds. Initial image demonstrates localizing instrument anterior to C4. Subsequent images demonstrate placement of surgical plate and fixating screws with interbody device at C3-C4. IMPRESSION: Intraoperative  fluoroscopic assistance provided during cervical spine surgery. Electronically Signed   By: Jasmine PangKim  Fujinaga M.D.   On: 08/30/2019 16:48  ? ?DG C-Arm 1-60 Min ? ?Result Date: 08/30/2019 ?CLINICAL DATA:  C-spine fusion EXAM: CERVICAL SPINE - 2-3 VIEW; DG C-ARM 1-60 MIN COMPARISON:  MRI 08/26/2019 FINDINGS: Three low resolution intraoperative spot views of the cervical spine. Total fluoroscopy time was 7 seconds. Initial image demonstrates localizing instrument anterior to C4. Subsequent images demonstrate placement of surgical plate and fixating screws with interbody device at C3-C4. IMPRESSION: Intraoperative fluoroscopic assistance provided during  cervical spine surgery. Electronically Signed   By: Jasmine Pang M.D.   On: 08/30/2019 16:48  ? ? ?Assessment & Plan:  ? ?Janet was seen today for copd, hypertension, asthma and back pain. ? ?Diagnoses and all orders for this visit: ? ?COPD with asthma (HCC) - I recommended that he start using ICS/LABA/LAMA/SABA inhalers. ?-     Budeson-Glycopyrrol-Formoterol (BREZTRI AEROSPHERE) 160-9-4.8 MCG/ACT AERO; Inhale 2 puffs into the lungs 2 (two) times daily. ?-     albuterol (VENTOLIN HFA) 108 (90 Base) MCG/ACT inhaler; Inhale 2 puffs into the lungs every 6 (six) hours as needed for wheezing or shortness of breath. ?-     CBC with Differential/Platelet; Future ?-     CBC with Differential/Platelet ? ?Hyperlipidemia LDL goal <70- LDL goal achieved. Doing well on the statin  ?-     atorvastatin (LIPITOR) 10 MG tablet; Take 1 tablet (10 mg total) by mouth daily. ? ?Essential hypertension- His BP is adequately well controlled. ?-     indapamide (LOZOL) 1.25 MG tablet; Take 1 tablet (1.25 mg total) by mouth daily. ?-     carvedilol (COREG) 6.25 MG tablet; Take 1 tablet (6.25 mg total) by mouth 2 (two) times daily with a meal. ?-     Basic metabolic panel; Future ?-     CBC with Differential/Platelet; Future ?-     CBC with Differential/Platelet ?-     Basic metabolic panel ? ?Need  for prophylactic vaccination and inoculation against varicella ?-     Zoster Vaccine Adjuvanted [facility]) injection; Inject 0.5 mLs into the muscle once for 1 dose. ? ?Chronic bilateral low back pain without sciatica ?-     Ambul

## 2021-08-15 NOTE — Patient Instructions (Signed)
Chronic Obstructive Pulmonary Disease ? ?Chronic obstructive pulmonary disease (COPD) is a long-term (chronic) condition that affects the lungs. COPD is a general term that can be used to describe many different lung problems that cause lung inflammation and limit airflow, including chronic bronchitis and emphysema. ?If you have COPD, your lung function will probably never return to normal. In most cases, it gets worse over time. However, there are steps you can take to slow the progression of the disease and improve your quality of life. ?What are the causes? ?This condition may be caused by: ?Smoking. This is the most common cause. ?Certain genes passed down through families. ?What increases the risk? ?The following factors may make you more likely to develop this condition: ?Being exposed to secondhand smoke from cigarettes, pipes, or cigars. ?Being exposed to chemicals and other irritants, such as fumes and dust in the work environment. ?Having chronic lung conditions or infections. ?What are the signs or symptoms? ?Symptoms of this condition include: ?Shortness of breath, especially during physical activity. ?Chronic cough with a large amount of thick mucus. Sometimes, the cough may not have any mucus (dry cough). ?Wheezing and rapid breathing. ?Gray or bluish discoloration (cyanosis) of the skin, especially in the fingers, toes, or lips. ?Feeling tired (fatigue). ?Weight loss. ?Chest tightness. ?Frequent infections. ?Episodes when breathing symptoms become much worse (exacerbations). ?At the later stages of this disease, you may have swelling in the ankles, feet, or legs. ?How is this diagnosed? ?This condition is diagnosed based on: ?Your medical history. ?A physical exam. ?You may also have tests, including: ?Lung (pulmonary) function tests. This may include a spirometry test, which measures your ability to exhale properly. ?Chest X-ray. ?CT scan. ?Blood tests. ?How is this treated? ?This condition may be  treated with: ?Medicines. These may include inhaled rescue medicines to treat acute exacerbations as well as medicines that you take long-term (maintenance medicines) to prevent flare-ups of COPD. ?Bronchodilators help treat COPD by dilating the airways to allow increased airflow and make your breathing more comfortable. ?Steroids can reduce airway inflammation and help prevent exacerbations. ?Smoking cessation. If you smoke, your health care provider may ask you to quit, and may also recommend therapy or replacement products to help you quit. ?Pulmonary rehabilitation. This may involve working with a team of health care providers and specialists, such as respiratory, occupational, and physical therapists. ?Exercise and physical activity. These are beneficial for nearly all people with COPD. ?Nutrition therapy to gain weight, if you are underweight. ?Oxygen. Supplemental oxygen therapy is only helpful if you have a low oxygen level in your blood (hypoxemia). ?Lung surgery or transplant. ?Palliative care. This is to help people with COPD feel comfortable when treatment is no longer working. ?Follow these instructions at home: ?Medicines ?Take over-the-counter and prescription medicines only as told by your health care provider. This includes inhaled medicines and pills. ?Talk to your health care provider before taking any cough or allergy medicines. You may need to avoid certain medicines that dry out your airways. ?Lifestyle ?If you smoke, the most important thing that you can do is to stop smoking. Continuing to smoke will cause the disease to progress faster. ?Do not use any products that contain nicotine or tobacco. These products include cigarettes, chewing tobacco, and vaping devices, such as e-cigarettes. If you need help quitting, ask your health care provider. ?Avoid exposure to things that irritate your lungs, such as smoke, chemicals, and fumes. ?Stay active, but balance activity with periods of rest.  Exercise and   physical activity will help you maintain your ability to do things you want to do. ?Learn and use relaxation techniques to manage stress and to control your breathing. ?Get the right amount of sleep and get quality sleep. Most adults need 7 or more hours per night. ?Eat healthy foods. Eating smaller, more frequent meals and resting before meals may help you maintain your strength. ?Controlled breathing ?Learn and use controlled breathing techniques as directed by your health care provider. Controlled breathing techniques include: ?Pursed lip breathing. Start by breathing in (inhaling) through your nose for 1 second. Then, purse your lips as if you were going to whistle and breathe out (exhale) through the pursed lips for 2 seconds. ?Diaphragmatic breathing. Start by putting one hand on your abdomen just above your waist. Inhale slowly through your nose. The hand on your abdomen should move out. Then purse your lips and exhale slowly. You should be able to feel the hand on your abdomen moving in as you exhale. ? ?Controlled coughing ?Learn and use controlled coughing to clear mucus from your lungs. Controlled coughing is a series of short, progressive coughs. The steps of controlled coughing are: ?Lean your head slightly forward. ?Breathe in deeply using diaphragmatic breathing. ?Try to hold your breath for 3 seconds. ?Keep your mouth slightly open while coughing twice. ?Spit any mucus out into a tissue. ?Rest and repeat the steps once or twice as needed. ?General instructions ?Make sure you receive all the vaccines that your health care provider recommends, especially the pneumococcal and influenza vaccines. Preventing infection and hospitalization is very important when you have COPD. ?Drink enough fluid to keep your urine pale yellow, unless you have a medical condition that requires fluid restriction. ?Use oxygen therapy and pulmonary rehabilitation if told by your health care provider. If you  require home oxygen therapy, ask your health care provider whether you should purchase a pulse oximeter to measure your oxygen level at home. ?Work with your health care provider to develop a COPD action plan. This will help you know what steps to take if your condition gets worse. ?Keep other chronic health conditions under control as told by your health care provider. ?Avoid extreme temperature and humidity changes. ?Avoid contact with people who have an illness that spreads from person to person (is contagious), such as viral infections or pneumonia. ?Keep all follow-up visits. This is important. ?Contact a health care provider if: ?You are coughing up more mucus than usual. ?There is a change in the color or thickness of your mucus. ?Your breathing is more labored than usual. ?Your breathing is faster than usual. ?You have difficulty sleeping. ?You need to use your rescue medicines or inhalers more often than expected. ?You have trouble doing routine activities such as getting dressed or walking around the house. ?Get help right away if: ?You have shortness of breath while you are resting. ?You have shortness of breath that prevents you from: ?Being able to talk. ?Performing your usual physical activities. ?You have chest pain lasting longer than 5 minutes. ?Your skin color is more blue (cyanotic) than usual. ?You measure low oxygen saturations for longer than 5 minutes with a pulse oximeter. ?You have a fever. ?You feel too tired to breathe normally. ?These symptoms may represent a serious problem that is an emergency. Do not wait to see if the symptoms will go away. Get medical help right away. Call your local emergency services (911 in the [place] Do not drive yourself to the hospital. ?Summary ?Chronic obstructive   pulmonary disease (COPD) is a long-term (chronic) condition that affects the lungs. ?Your lung function will probably never return to normal. In most cases, it gets worse over time. However, there  are steps you can take to slow the progression of the disease and improve your quality of life. ?Treatment for COPD may include taking medicines, quitting smoking, pulmonary rehabilitation, and changes to diet and

## 2021-08-17 ENCOUNTER — Encounter: Payer: Self-pay | Admitting: Internal Medicine

## 2021-08-22 ENCOUNTER — Other Ambulatory Visit: Payer: Self-pay | Admitting: Internal Medicine

## 2021-08-22 DIAGNOSIS — L309 Dermatitis, unspecified: Secondary | ICD-10-CM

## 2021-08-30 ENCOUNTER — Telehealth: Payer: Self-pay | Admitting: Internal Medicine

## 2021-08-30 NOTE — Telephone Encounter (Signed)
Larry Blair with [facility] calls today in regards to setting up physical therapy for PT. PT has notified Larry Blair that he would like [place] to find somewhere closer to his home for physical therapy needs.

## 2021-09-13 ENCOUNTER — Encounter: Payer: Self-pay | Admitting: Physical Therapy

## 2021-09-13 ENCOUNTER — Ambulatory Visit: Payer: Medicare Other | Attending: Internal Medicine | Admitting: Physical Therapy

## 2021-09-13 DIAGNOSIS — M545 Low back pain, unspecified: Secondary | ICD-10-CM | POA: Insufficient documentation

## 2021-09-13 DIAGNOSIS — M6281 Muscle weakness (generalized): Secondary | ICD-10-CM | POA: Insufficient documentation

## 2021-09-13 DIAGNOSIS — M2560 Stiffness of unspecified joint, not elsewhere classified: Secondary | ICD-10-CM | POA: Insufficient documentation

## 2021-09-13 DIAGNOSIS — G8929 Other chronic pain: Secondary | ICD-10-CM | POA: Diagnosis not present

## 2021-09-13 NOTE — Therapy (Signed)
OUTPATIENT PHYSICAL THERAPY THORACOLUMBAR EVALUATION   Patient Name: Larry Blair MRN: [id number] DOB:23-Aug-1955, 66 y.o., male Today's Date: 09/13/2021   Treatment: 1 of 17.  Recert date: 11/08/21 0940 to 1034   Past Medical History:  Diagnosis Date   Allergy    Arthritis    right shoulder   Asthma    Eczema    HTN (hypertension)    Hyperlipidemia    Substance abuse (HCC) 1976   daily   Past Surgical History:  Procedure Laterality Date   ANTERIOR CERVICAL DECOMP/DISCECTOMY FUSION N/A 08/30/2019   Procedure: CERVICAL THREE-FOUR ANTERIOR CERVICAL DECOMPRESSION/FUSION;  Surgeon: Tia Alert, MD;  Location: [facility] OR;  Service: Neurosurgery;  Laterality: N/A;   COLONOSCOPY     KNEE ARTHROSCOPY Left    Patient Active Problem List   Diagnosis Date Noted   COPD with asthma (HCC) 08/15/2021   Chronic bilateral low back pain without sciatica 08/15/2021   Need for prophylactic vaccination and inoculation against varicella 08/15/2021   Benign prostatic hyperplasia without lower urinary tract symptoms 10/03/2020   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   Diuretic-induced hypokalemia 11/24/2017   Chronic eczema of hand 08/11/2017   Hyperlipidemia LDL goal <70 07/01/2017   Routine general medical examination at a health care facility 06/30/2017   Essential hypertension 06/30/2017   Vitamin D deficiency 06/30/2017    PCP: Etta Grandchild, MD   REFERRING PROVIDER: Etta Grandchild, MD   REFERRING DIAG: Chronic bilateral low back pain without sciatica  Rationale for Evaluation and Treatment Rehabilitation  THERAPY DIAG:  Chronic bilateral low back pain without sciatica  Joint stiffness  Muscle weakness (generalized)  ONSET DATE: 05/10/2019 (approximate date)- ACDF 08/30/19.    SUBJECTIVE:                                                                                                                                                                                            SUBJECTIVE STATEMENT: No MD f/u appts.  Pt. Reports he fell and bruised spinal cord 5 years ago while pushing wife in w/c.  Pain with prolonged standing. Pt is primary caregiver for wife.  Pt. Reports no back pain at this time.    PERTINENT HISTORY:  ACDF, hx of knee scope on L knee.  Still utilizes knee brace. Hx of chronic ankle rolling bilaterally.   PAIN:  Are you having pain? Yes: NPRS scale: 0/10 Pain location: Low back Pain description: dull achy Aggravating factors: Prolonged standing at sink and carrying laundry Relieving factors: sitting, tylenol 650 mg 2 in morning   PRECAUTIONS: None  WEIGHT BEARING RESTRICTIONS No  FALLS:  Has  patient fallen in last 6 months? 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    OBJECTIVE:   PATIENT SURVEYS:  FOTO initial 44/ goal 70  SCREENING FOR RED FLAGS: Bowel or bladder incontinence: Yes: Hard to hold urine Spinal tumors: No Cauda equina syndrome: No Compression fracture: No Abdominal aneurysm: No  COGNITION:  Overall cognitive status: Within functional limits for tasks assessed     SENSATION: Light touch: Impaired  on L LE  POSTURE: rounded shoulders  LOWER/UPPER EXTREMITY ROM:     Limited L/R SLR in supine position secondary to muscle weakness.  B shoulder and knee/LE AROM WFL  LOWER/UPPER EXTREMITY MMT:       Difficulty lifting up R hip (flexion).    MMT Right eval Left eval  Hip flexion 3/5 3/5  Hip extension 4/5 4/5  Hip abduction 4+/5 4+/5  Hip  adduction 4+/5 4+/5  Hip internal rotation 4/5 5/5  Hip external rotation 4/5 5/5  Knee flexion 4/5 4/5  Knee extension 4-/5 4-/5  Ankle dorsiflexion 4/5 4/5  Ankle plantarflexion 4/5 4/5  Ankle inversion    Ankle eversion     (Blank rows = not tested)    B shoulder strength grossly: flexion 4/5 MMT, abduction 3/5, extension 4/5, bicep 5/5, tricep 5/5, sh. IR 4/5, ER 5/5 MMT.  Grip L 72#/ R 70#.       GAIT: Distance walked: in clinic (decrease hip flexion/ step length).   Assistive device utilized: Single point cane Level of assistance: Modified independence   TODAY'S TREATMENT  Evaluation/ issued HEP   PATIENT EDUCATION:  Education details: Access Code: [email or web address] Person educated: Patient Education method: Explanation, Demonstration, and Handouts Education comprehension: verbalized understanding and returned demonstration   HOME EXERCISE PROGRAM: Access Code: [facility] URL: [email or web address] Date: 09/13/2021 Prepared by: Dorene Grebe  Exercises - Standing March with Unilateral Counter Support  - 1 x daily - 7 x weekly - 1 sets - 20 reps - Sit to Stand Without Arm Support  - 1 x daily - 7 x weekly - 1 sets - 20 reps - Supine Active Straight Leg Raise  - 1 x daily - 7 x weekly - 1 sets - 20 reps - Supine March  - 1 x daily - 7 x weekly - 1 sets - 20 reps - Seated Long Arc Quad  - 1 x daily - 7 x weekly - 1 sets - 20 reps  ASSESSMENT:  CLINICAL IMPRESSION: Patient is a pleasant 66 y.o. male who was seen today for physical therapy evaluation and treatment for chronic low back pain/ LE muscle weakness.  Pt. Reports no pain currently and presents with moderate B hip flexor muscle weakness.  Limited hip flexion/ step length/ heel strike noted during gait with use of SPC.  Pt. Will benefit from skilled PT services to increase LE muscle strength to improve gait/ pain-free functional mobility.     OBJECTIVE IMPAIRMENTS Abnormal gait, decreased activity  tolerance, decreased balance, decreased endurance, decreased mobility, difficulty walking, decreased ROM, decreased  strength, impaired flexibility, improper body mechanics, postural dysfunction, and pain.   ACTIVITY LIMITATIONS carrying, lifting, bending, standing, squatting, stairs, transfers, and locomotion level  PARTICIPATION LIMITATIONS: cleaning, community activity, occupation, and yard work  PERSONAL FACTORS Fitness, Past/current experiences, and Profession are also affecting patient's functional outcome.   REHAB POTENTIAL: Good  CLINICAL DECISION MAKING: Evolving/moderate complexity  EVALUATION COMPLEXITY: Moderate   GOALS: Goals reviewed with patient? Yes  SHORT TERM GOALS: Target date: 10/11/21  Pt. Independent with HEP to increase B LE strength 1/2 muscle grade to improve standing/ independence with gait.   Baseline:  see above Goal status: INITIAL   LONG TERM GOALS: Target date: 11/08/21  Pt. Will increase FOTO to 58 to improve functional mobility.   Baseline: Initial FOTO 44 Goal status: INITIAL  2.  Pt. Will report no low back pain consistently for 1 week with all work-related/ household tasks.   Baseline: increase back pain with increase activity.   Goal status: INITIAL  3.  Pt. Will ambulate with more normalized gait pattern with consistent hip flexion/ step length with least assistive device safely.   Baseline:  Goal status: INITIAL   PLAN: PT FREQUENCY: 2x/week  PT DURATION: 8 weeks  PLANNED INTERVENTIONS: Therapeutic exercises, Therapeutic activity, Neuromuscular re-education, Balance training, Gait training, Patient/Family education, Joint mobilization, Stair training, Spinal mobilization, Cryotherapy, Moist heat, and Manual therapy.  PLAN FOR NEXT SESSION: Progress HEP  Cammie McgeeMichael C Itza Maniaci, PT, DPT # [phone] 09/18/2021, 9:15 AM

## 2021-09-18 ENCOUNTER — Encounter: Payer: Self-pay | Admitting: Physical Therapy

## 2021-09-18 ENCOUNTER — Ambulatory Visit: Payer: Medicare Other | Admitting: Physical Therapy

## 2021-09-18 DIAGNOSIS — M6281 Muscle weakness (generalized): Secondary | ICD-10-CM | POA: Diagnosis not present

## 2021-09-18 DIAGNOSIS — M545 Low back pain, unspecified: Secondary | ICD-10-CM | POA: Diagnosis not present

## 2021-09-18 DIAGNOSIS — G8929 Other chronic pain: Secondary | ICD-10-CM | POA: Diagnosis not present

## 2021-09-18 DIAGNOSIS — M2560 Stiffness of unspecified joint, not elsewhere classified: Secondary | ICD-10-CM | POA: Diagnosis not present

## 2021-09-18 NOTE — Therapy (Addendum)
OUTPATIENT PHYSICAL THERAPY THORACOLUMBAR TREATMENT   Patient Name: Larry Blair MRN: [id number] DOB:Oct 01, 1955, 66 y.o., male Today's Date: 09/18/2021   PT End of Session - 09/18/21 1410     Visit Number 2    Number of Visits 17    Date for PT Re-Evaluation 11/08/21    PT Start Time 1303    PT Stop Time 1401    PT Time Calculation (min) 58 min    Equipment Utilized During Treatment Gait belt    Activity Tolerance Patient tolerated treatment well;No increased pain    Behavior During Therapy WFL for tasks assessed/performed             Past Medical History:  Diagnosis Date   Allergy    Arthritis    right shoulder   Asthma    Eczema    HTN (hypertension)    Hyperlipidemia    Substance abuse (HCC) 1976   daily   Past Surgical History:  Procedure Laterality Date   ANTERIOR CERVICAL DECOMP/DISCECTOMY FUSION N/A 08/30/2019   Procedure: CERVICAL THREE-FOUR ANTERIOR CERVICAL DECOMPRESSION/FUSION;  Surgeon: Tia Alert, MD;  Location: [facility] OR;  Service: Neurosurgery;  Laterality: N/A;   COLONOSCOPY     KNEE ARTHROSCOPY Left    Patient Active Problem List   Diagnosis Date Noted   COPD with asthma (HCC) 08/15/2021   Chronic bilateral low back pain without sciatica 08/15/2021   Need for prophylactic vaccination and inoculation against varicella 08/15/2021   Benign prostatic hyperplasia without lower urinary tract symptoms 10/03/2020   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   Diuretic-induced hypokalemia 11/24/2017   Chronic eczema of hand 08/11/2017   Hyperlipidemia LDL goal <70 07/01/2017   Routine general medical examination at a health care facility 06/30/2017   Essential hypertension 06/30/2017   Vitamin D deficiency 06/30/2017    PCP: Etta Grandchild, MD   REFERRING PROVIDER: Etta Grandchild, MD   REFERRING DIAG: Chronic bilateral low back pain without sciatica  Rationale for Evaluation and Treatment Rehabilitation  THERAPY DIAG:  Chronic bilateral low back  pain without sciatica  Joint stiffness  Muscle weakness (generalized)  ONSET DATE: 05/10/2019 (approximate date)- ACDF 08/30/19.    SUBJECTIVE:                                                                                                                                                                                           SUBJECTIVE STATEMENT:  Pt currently has 0/10 pain, but was doing yard work this weekend and had 6/10 pain in his lower back. Pt stated that he does not typically utilize his cane or his R knee brace  while ambulating throughout the home, but he uses them for long distances such as grocery shopping or working. Pt is the primary caregiver within his household, and remains active with errands regularly.   PERTINENT HISTORY:  ACDF, hx of knee scope on L knee.  Still utilizes knee brace. Hx of chronic ankle rolling bilaterally.   PAIN:  Are you having pain? Yes: NPRS scale: 0/10 Pain location: Low back Pain description: dull achy Aggravating factors: Prolonged standing at sink and carrying laundry Relieving factors: sitting, tylenol 650 mg 2 in morning   PRECAUTIONS: None  WEIGHT BEARING RESTRICTIONS No  FALLS:  Has patient fallen in last 6 months? 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    OBJECTIVE:   PATIENT SURVEYS:  FOTO initial 44/ goal 16  SCREENING FOR RED FLAGS: Bowel or bladder incontinence: Yes: Hard to hold urine Spinal tumors: No Cauda equina syndrome: No Compression fracture:  No Abdominal aneurysm: No  COGNITION:  Overall cognitive status: Within functional limits for tasks assessed     SENSATION: Light touch: Impaired  on L LE  POSTURE: rounded shoulders  LOWER/UPPER EXTREMITY ROM:     Limited L/R SLR in supine position secondary to muscle weakness.  B shoulder and knee/LE AROM WFL  LOWER/UPPER EXTREMITY MMT:       Difficulty lifting up R hip (flexion).    MMT Right eval Left eval  Hip flexion 3/5 3/5  Hip extension 4/5 4/5  Hip abduction 4+/5 4+/5  Hip adduction 4+/5 4+/5  Hip internal rotation 4/5 5/5  Hip external rotation 4/5 5/5  Knee flexion 4/5 4/5  Knee extension 4-/5 4-/5  Ankle dorsiflexion 4/5 4/5  Ankle plantarflexion 4/5 4/5  Ankle inversion    Ankle eversion     (Blank rows = not tested)    B shoulder strength grossly: flexion 4/5 MMT, abduction 3/5, extension 4/5, bicep 5/5, tricep 5/5, sh. IR 4/5, ER 5/5 MMT.  Grip L 72#/ R 70#.       GAIT: Distance walked: in clinic (decrease hip flexion/ step length).   Assistive device utilized: Single point cane Level of assistance: Modified independence TX:  09/18/21:  10 mins NuStep 8 mins, level 6 (UE/LE)  There Ex:  Walking forward and backwards in // bars - 8 x down and back with gait belt (GB)  Monster walks without resistance in // bars 6 x forward and backwards with BG  Weight shifting on airex pad - 30 x with GB  Step ups and step downs onto blue airex pad x 20 - with GB  Toe taps on 6" step, 20 x B w/ GB  Eccentric descent off of 6" step (step downs) - 10 x B  w/ GB  LAQ with 5# ankle weights 20 x 2 B  Seated marching with 5# ankle weight - 20 x 2 B  Toe raises with 5# ankle weight 10 x 2 B  Supine bridging 10 x - 5s hold  Supine bridging with straight legs on red  bolster 10 x - 5 s hold   Supine marching - 20 x   Manual Therapy:  Prone: Hypervolt level 1 to lumbar region of back for 10 mins    PATIENT EDUCATION:  Education details: Access Code:  [email or web address] Person educated: Patient Education method: Explanation, Demonstration, and Handouts Education comprehension: verbalized understanding and returned demonstration   HOME EXERCISE PROGRAM: Access Code: [email or web address] URL: [email or web address] Date: 09/13/2021 Prepared by: Dorene GrebeMichael Sherk  Exercises - Standing March with Unilateral Counter Support  - 1 x daily - 7 x weekly - 1 sets - 20 reps - Sit to Stand Without Arm Support  - 1 x daily - 7 x weekly - 1 sets - 20 reps - Supine Active Straight Leg Raise  - 1 x daily - 7 x weekly - 1 sets - 20 reps - Supine March  - 1 x daily - 7 x weekly - 1 sets - 20 reps - Seated Long Arc Quad  - 1 x daily - 7 x weekly - 1 sets - 20 reps  ASSESSMENT:  CLINICAL IMPRESSION: Patient reports no pain, and presents with moderate B hip flexor muscle weakness. Pt also stated that he felt instability in his L knee, and felt weakness in his LE's when ascending and descending steps. Limited hip flexion/ step length/ heel strike noted during gait with use of SPC. Pt ambulates with pronated feet, with internal rotation of B feet as well. Pt also circumducts his R LE during gait and exhibits bilateral trendelenburg. Pt has decreased arm swing, and has a flexed R UE during gait activities. Pt was most challenged with seated marching, ascending a 6" stair with his R LE, and descending a 6" stair with his L LE. Pt utilized his low back to compensate for decreased hip flexion, which is visible in LAQ and seated marching interventions. Pt will benefit from skilled PT services to increase LE muscle strength to improve gait/ pain-free functional mobility, and to decrease pain in low back.     OBJECTIVE IMPAIRMENTS Abnormal gait, decreased activity tolerance, decreased balance, decreased endurance, decreased mobility, difficulty walking, decreased ROM, decreased strength, impaired flexibility, improper body mechanics, postural dysfunction, and pain.   ACTIVITY  LIMITATIONS carrying, lifting, bending, standing, squatting, stairs, transfers, and locomotion level  PARTICIPATION LIMITATIONS: cleaning, community activity, occupation, and yard work  PERSONAL FACTORS Fitness, Past/current experiences, and Profession are also affecting patient's functional outcome.   REHAB POTENTIAL: Good  CLINICAL DECISION MAKING: Evolving/moderate complexity  EVALUATION COMPLEXITY: Moderate   GOALS: Goals reviewed with patient? Yes  SHORT TERM GOALS: Target date: 10/11/21  Pt. Independent with HEP to increase B LE strength 1/2 muscle grade to improve standing/ independence with gait.   Baseline:  see above Goal status: INITIAL   LONG TERM GOALS: Target date: 11/08/21  Pt. Will increase FOTO to 58 to improve functional mobility.   Baseline: Initial FOTO 44 Goal status: INITIAL  2.  Pt. Will report no low back pain consistently for 1 week with all work-related/ household tasks.   Baseline: increase back pain with increase activity.   Goal status: INITIAL  3.  Pt. Will ambulate with more normalized gait pattern with consistent hip flexion/ step length with least assistive device safely.   Baseline:  Goal status: INITIAL   PLAN: PT FREQUENCY: 2x/week  PT DURATION: 8 weeks  PLANNED INTERVENTIONS: Therapeutic exercises, Therapeutic activity, Neuromuscular re-education, Balance training, Gait training, Patient/Family education, Joint mobilization, Stair training, Spinal mobilization, Cryotherapy, Moist heat, and Manual therapy.  PLAN FOR NEXT SESSION: Progress HEP, gait and stair training.  Thornell Sartorius, SPT  Cammie Mcgee, PT, DPT # [phone] 09/18/2021, 3:03 PM

## 2021-09-19 ENCOUNTER — Other Ambulatory Visit: Payer: Self-pay | Admitting: Internal Medicine

## 2021-09-19 DIAGNOSIS — L309 Dermatitis, unspecified: Secondary | ICD-10-CM

## 2021-09-20 ENCOUNTER — Encounter: Payer: Self-pay | Admitting: Physical Therapy

## 2021-09-20 ENCOUNTER — Ambulatory Visit: Payer: Medicare Other | Admitting: Physical Therapy

## 2021-09-20 DIAGNOSIS — M545 Low back pain, unspecified: Secondary | ICD-10-CM

## 2021-09-20 DIAGNOSIS — M2560 Stiffness of unspecified joint, not elsewhere classified: Secondary | ICD-10-CM | POA: Diagnosis not present

## 2021-09-20 DIAGNOSIS — M6281 Muscle weakness (generalized): Secondary | ICD-10-CM

## 2021-09-20 DIAGNOSIS — G8929 Other chronic pain: Secondary | ICD-10-CM | POA: Diagnosis not present

## 2021-09-20 NOTE — Therapy (Signed)
OUTPATIENT PHYSICAL THERAPY THORACOLUMBAR TREATMENT   Patient Name: Larry Blair MRN: [id number] DOB:[birth year], 66 y.o., male Today's Date: 09/20/2021   PT End of Session - 09/20/21 1643     Visit Number 3    Number of Visits 17    Date for PT Re-Evaluation 11/08/21    PT Start Time 1643    PT Stop Time 1727    PT Time Calculation (min) 44 min    Equipment Utilized During Treatment Gait belt    Activity Tolerance Patient tolerated treatment well;No increased pain    Behavior During Therapy WFL for tasks assessed/performed             Past Medical History:  Diagnosis Date   Allergy    Arthritis    right shoulder   Asthma    Eczema    HTN (hypertension)    Hyperlipidemia    Substance abuse (HCC) 1976   daily   Past Surgical History:  Procedure Laterality Date   ANTERIOR CERVICAL DECOMP/DISCECTOMY FUSION N/A 08/30/2019   Procedure: CERVICAL THREE-FOUR ANTERIOR CERVICAL DECOMPRESSION/FUSION;  Surgeon: Tia Alert, MD;  Location: [facility] OR;  Service: Neurosurgery;  Laterality: N/A;   COLONOSCOPY     KNEE ARTHROSCOPY Left    Patient Active Problem List   Diagnosis Date Noted   COPD with asthma (HCC) 08/15/2021   Chronic bilateral low back pain without sciatica 08/15/2021   Need for prophylactic vaccination and inoculation against varicella 08/15/2021   Benign prostatic hyperplasia without lower urinary tract symptoms 10/03/2020   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   Diuretic-induced hypokalemia 11/24/2017   Chronic eczema of hand 08/11/2017   Hyperlipidemia LDL goal <70 07/01/2017   Routine general medical examination at a health care facility 06/30/2017   Essential hypertension 06/30/2017   Vitamin D deficiency 06/30/2017    PCP: Etta Grandchild, MD   REFERRING PROVIDER: Etta Grandchild, MD   REFERRING DIAG: Chronic bilateral low back pain without sciatica  Rationale for Evaluation and Treatment Rehabilitation  THERAPY DIAG:  Chronic bilateral low back  pain without sciatica  Joint stiffness  Muscle weakness (generalized)  ONSET DATE: 05/10/2019 (approximate date)- ACDF 08/30/19.    SUBJECTIVE:                                                                                                                                                                                           SUBJECTIVE STATEMENT:  Pt reports no pain prior to tx. Session.  Pt. Worked yesterday and was doing some a lot of sitting/overhead work under the car.  No falls or LOB reported.    PERTINENT HISTORY:  ACDF, hx of knee scope on L knee.  Still utilizes knee brace. Hx of chronic ankle rolling bilaterally.   PAIN:  Are you having pain? Yes: NPRS scale: 0/10 Pain location: Low back Pain description: dull achy Aggravating factors: Prolonged standing at sink and carrying laundry Relieving factors: sitting, tylenol 650 mg 2 in morning   PRECAUTIONS: None  WEIGHT BEARING RESTRICTIONS No  FALLS:  Has patient fallen in last 6 months? 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    OBJECTIVE:   PATIENT SURVEYS:  FOTO initial 44/ goal 59  SCREENING FOR RED FLAGS: Bowel or bladder incontinence: Yes: Hard to hold urine Spinal tumors: No Cauda equina syndrome: No Compression fracture: No Abdominal aneurysm: No  COGNITION:  Overall cognitive status: Within functional limits for tasks assessed     SENSATION: Light touch: Impaired  on L LE  POSTURE: rounded shoulders  LOWER/UPPER EXTREMITY ROM:      Limited L/R SLR in supine position secondary to muscle weakness.  B shoulder and knee/LE AROM WFL  LOWER/UPPER EXTREMITY MMT:       Difficulty lifting up R hip (flexion).    MMT Right eval Left eval  Hip flexion 3/5 3/5  Hip extension 4/5 4/5  Hip abduction 4+/5 4+/5  Hip adduction 4+/5 4+/5  Hip internal rotation 4/5 5/5  Hip external rotation 4/5 5/5  Knee flexion 4/5 4/5  Knee extension 4-/5 4-/5  Ankle dorsiflexion 4/5 4/5  Ankle plantarflexion 4/5 4/5  Ankle inversion    Ankle eversion     (Blank rows = not tested)    B shoulder strength grossly: flexion 4/5 MMT, abduction 3/5, extension 4/5, bicep 5/5, tricep 5/5, sh. IR 4/5, ER 5/5 MMT.  Grip L 72#/ R 70#.       GAIT: Distance walked: in clinic (decrease hip flexion/ step length).   Assistive device utilized: Single point cane Level of assistance: Modified independence    TX:   09/20/21:   Nustep L5 B UE/LE  There.ex.:  Walking in //-bars: forward (increase arm swing/ recip. Pattern)- 5 laps.  Walking in hallway (cuing to correct R toe drag).  2 laps  Standing toe and heel taps at 6" step (stairs with UE assist on handrails)- 20x each.  SBA for safety/ cuing.    Sit to stands from gray chair: 10x2 (no UE assist)  Seated hip flexion with added cone taps (alternating)- 20x.   Seated cone taps with color alternating instruction (L/R).  Fatigue noted in LE.    Walking marches in gym with SBA/CGA for safety and verbal cuing.  Agility ladder working on consistent step length with blue rungs on ladder.  Marked improvement in consistent recip. Gait pattern with increase reps at agility ladder.    Seated heel and toe raises in gray chair 20x.  Seated LAQ 20x.    Walking from gym to chair with cuing/ SBA and correction of hip flexion/ step pattern.  No LOB    09/18/21:  10 mins NuStep, level  6 (UE/LE)  There Ex:  Walking forward and backwards in // bars - 8 x down and back with gait belt (GB)  Monster  walks without resistance in // bars 6 x forward and backwards with BG  Weight shifting on airex pad - 30 x with GB  Step ups and step downs onto blue airex pad x 20 - with GB  Toe taps on 6" step, 20 x B w/ GB  Eccentric descent off of 6" step (step downs) - 10 x B  w/ GB  LAQ with 5# ankle weights 20 x 2 B  Seated marching with 5# ankle weight - 20 x 2 B  Toe raises with 5# ankle weight 10 x 2 B  Supine bridging 10 x - 5s hold  Supine bridging with straight legs on red bolster 10 x - 5 s hold   Supine marching - 20 x   Manual Therapy:  Prone: Hypervolt level 1 to lumbar region of back for 10 mins    PATIENT EDUCATION:  Education details: Access Code: [email or web address] Person educated: Patient Education method: Explanation, Demonstration, and Handouts Education comprehension: verbalized understanding and returned demonstration   HOME EXERCISE PROGRAM: Access Code: [facility] URL: [email or web address] Date: 09/13/2021 Prepared by: Dorene Grebe  Exercises - Standing March with Unilateral Counter Support  - 1 x daily - 7 x weekly - 1 sets - 20 reps - Sit to Stand Without Arm Support  - 1 x daily - 7 x weekly - 1 sets - 20 reps - Supine Active Straight Leg Raise  - 1 x daily - 7 x weekly - 1 sets - 20 reps - Supine March  - 1 x daily - 7 x weekly - 1 sets - 20 reps - Seated Long Arc Quad  - 1 x daily - 7 x weekly - 1 sets - 20 reps  ASSESSMENT:  CLINICAL IMPRESSION: Pt. Challenged with seated hip flexion with added abduction during cone taps.  Pt. Works hard during tx. Session and is focused on consistent hip flexion/ heel strike with gait. Pt ambulates with pronated feet, with internal rotation of B feet as well.  Pt continues to utilize his low back to compensate for decreased hip flexion, which is visible in LAQ and seated marching interventions. Pt will benefit from skilled PT services to increase LE muscle strength to improve gait/ pain-free functional  mobility, and to decrease pain in low back.     OBJECTIVE IMPAIRMENTS Abnormal gait, decreased activity tolerance, decreased balance, decreased endurance, decreased mobility, difficulty walking, decreased ROM, decreased strength, impaired flexibility, improper body mechanics, postural dysfunction, and pain.   ACTIVITY LIMITATIONS carrying, lifting, bending, standing, squatting, stairs, transfers, and locomotion level  PARTICIPATION LIMITATIONS: cleaning, community activity, occupation, and yard work  PERSONAL FACTORS Fitness, Past/current experiences, and Profession are also affecting patient's functional outcome.   REHAB POTENTIAL: Good  CLINICAL DECISION MAKING: Evolving/moderate complexity  EVALUATION COMPLEXITY: Moderate   GOALS: Goals reviewed with patient? Yes  SHORT TERM GOALS: Target date: 10/11/21  Pt. Independent with HEP to increase B LE strength 1/2 muscle grade to improve standing/ independence with gait.   Baseline:  see above Goal status: INITIAL   LONG TERM GOALS: Target date: 11/08/21  Pt. Will increase FOTO to 58 to improve functional mobility.   Baseline: Initial FOTO 44 Goal status: INITIAL  2.  Pt. Will report no low back pain consistently for 1 week with all work-related/ household tasks.   Baseline: increase back pain with increase  activity.   Goal status: INITIAL  3.  Pt. Will ambulate with more normalized gait pattern with consistent hip flexion/ step length with least assistive device safely.   Baseline:  Goal status: INITIAL   PLAN: PT FREQUENCY: 2x/week  PT DURATION: 8 weeks  PLANNED INTERVENTIONS: Therapeutic exercises, Therapeutic activity, Neuromuscular re-education, Balance training, Gait training, Patient/Family education, Joint mobilization, Stair training, Spinal mobilization, Cryotherapy, Moist heat, and Manual therapy.  PLAN FOR NEXT SESSION: Progress HEP, gait and stair training.  Cammie Mcgee, PT, DPT # [id number] 09/20/2021, 5:27 PM

## 2021-09-25 ENCOUNTER — Ambulatory Visit: Payer: Medicare Other | Admitting: Physical Therapy

## 2021-09-25 ENCOUNTER — Encounter: Payer: Self-pay | Admitting: Physical Therapy

## 2021-09-25 DIAGNOSIS — M6281 Muscle weakness (generalized): Secondary | ICD-10-CM

## 2021-09-25 DIAGNOSIS — M2560 Stiffness of unspecified joint, not elsewhere classified: Secondary | ICD-10-CM | POA: Diagnosis not present

## 2021-09-25 DIAGNOSIS — M545 Low back pain, unspecified: Secondary | ICD-10-CM | POA: Diagnosis not present

## 2021-09-25 DIAGNOSIS — G8929 Other chronic pain: Secondary | ICD-10-CM

## 2021-09-25 NOTE — Therapy (Addendum)
OUTPATIENT PHYSICAL THERAPY THORACOLUMBAR TREATMENT   Patient Name: Larry Blair MRN: [id number] DOB:05/01/1955, 66 y.o., male Today's Date: 09/25/2021   PT End of Session - 09/25/21 1257     Visit Number 4    Number of Visits 17    Date for PT Re-Evaluation 11/08/21    PT Start Time 1257    PT Stop Time 1344    PT Time Calculation (min) 47 min    Equipment Utilized During Treatment Gait belt    Behavior During Therapy WFL for tasks assessed/performed             Past Medical History:  Diagnosis Date   Allergy    Arthritis    right shoulder   Asthma    Eczema    HTN (hypertension)    Hyperlipidemia    Substance abuse ([place]) 1976   daily   Past Surgical History:  Procedure Laterality Date   ANTERIOR CERVICAL DECOMP/DISCECTOMY FUSION N/A 08/30/2019   Procedure: CERVICAL THREE-FOUR ANTERIOR CERVICAL DECOMPRESSION/FUSION;  Surgeon: Eustace Moore, MD;  Location: [facility];  Service: Neurosurgery;  Laterality: N/A;   COLONOSCOPY     KNEE ARTHROSCOPY Left    Patient Active Problem List   Diagnosis Date Noted   COPD with asthma ([place]) 08/15/2021   Chronic bilateral low back pain without sciatica 08/15/2021   Need for prophylactic vaccination and inoculation against varicella 08/15/2021   Benign prostatic hyperplasia without lower urinary tract symptoms 10/03/2020   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   Diuretic-induced hypokalemia 11/24/2017   Chronic eczema of hand 08/11/2017   Hyperlipidemia LDL goal <70 07/01/2017   Routine general medical examination at a health care facility 06/30/2017   Essential hypertension 06/30/2017   Vitamin D deficiency 06/30/2017    PCP: Janith Lima, MD   REFERRING PROVIDER: Janith Lima, MD   REFERRING DIAG: Chronic bilateral low back pain without sciatica  Rationale for Evaluation and Treatment Rehabilitation  THERAPY DIAG:  Chronic bilateral low back pain without sciatica  Joint stiffness  Muscle weakness  (generalized)  ONSET DATE: 05/10/2019 (approximate date)- ACDF 08/30/19.    SUBJECTIVE:                                                                                                                                                                                           SUBJECTIVE STATEMENT:  Pt reports no pain today, but was sore after prior tx session. Pt had soreness for about three days, specifically in his adductor muscles. Pt worked last Friday, and was doing more overhead work under the cars in his shop. No falls or LOB reported. Pt  has been very active for fathers day celebrations, and he reports that his L knee is feeling stronger today. Pt did not bring his L knee brace to tx session today.   PERTINENT HISTORY:  ACDF, hx of knee scope on L knee.  Still utilizes knee brace. Hx of chronic ankle rolling bilaterally.   PAIN:  Are you having pain? Yes: NPRS scale: 0/10 Pain location: Low back Pain description: dull achy Aggravating factors: Prolonged standing at sink and carrying laundry Relieving factors: sitting, tylenol 650 mg 2 in morning   PRECAUTIONS: None  WEIGHT BEARING RESTRICTIONS No  FALLS:  Has patient fallen in last 6 months? 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    OBJECTIVE:   PATIENT SURVEYS:  FOTO initial 44/ goal 42  SCREENING FOR RED FLAGS: Bowel or bladder incontinence: Yes: Hard to hold urine Spinal tumors: No Cauda equina syndrome: No Compression fracture: No Abdominal aneurysm:  No  COGNITION:  Overall cognitive status: Within functional limits for tasks assessed     SENSATION: Light touch: Impaired  on L LE  POSTURE: rounded shoulders  LOWER/UPPER EXTREMITY ROM:     Limited L/R SLR in supine position secondary to muscle weakness.  B shoulder and knee/LE AROM WFL  LOWER/UPPER EXTREMITY MMT:       Difficulty lifting up R hip (flexion).    MMT Right eval Left eval  Hip flexion 3/5 3/5  Hip extension 4/5 4/5  Hip abduction 4+/5 4+/5  Hip adduction 4+/5 4+/5  Hip internal rotation 4/5 5/5  Hip external rotation 4/5 5/5  Knee flexion 4/5 4/5  Knee extension 4-/5 4-/5  Ankle dorsiflexion 4/5 4/5  Ankle plantarflexion 4/5 4/5  Ankle inversion    Ankle eversion     (Blank rows = not tested)    B shoulder strength grossly: flexion 4/5 MMT, abduction 3/5, extension 4/5, bicep 5/5, tricep 5/5, sh. IR 4/5, ER 5/5 MMT.  Grip L 72#/ R 70#.       GAIT: Distance walked: in clinic (decrease hip flexion/ step length).   Assistive device utilized: Single point cane Level of assistance: Modified independence    TX:  09/25/21  Nustep L5 10 mins B UE/LE (discussed weekend activities).    Manual Therapy:  Supine stretching of LE - knee flexion/ knee extension/ hip flexion/ hip ER/ HIP IR - 8 mins total  There ex:  Walking in agility ladder without SPC - one foot per square - down and back 6 times  Walking in hallway without SPC (cuing to correct R toe drag) 3 laps  Standing toe and heel taps at 6" step (stairs with UE assist on handrails)- 20x each.  SBA for safety/ cuing.   6" lateral step downs at staircase- tap foot 6" below laterally and come back up onto step laterally - 10x per LE SBA for safety/ cuing.   Seated heel and toe raises in gray chair 20x - 2 sets  Seated BUSO taps - seated on chair with one foot  tapping bosu infront of feet at a time - alternating 10 x per LE   Seated marching with both feet on bosu- marching up off of bosu - 10  x - 2 sets    Blue ball seated with LAQ - 20 x - 3 sets  Blue ball seated marching - 20 x - 3 sets  Blue ball seated with 5# weight in hands - punching above head x 30    Verbal cueing required for heel-toe gait pattern, and increased hip flexion during gait activities   09/20/21:   Nustep L5 B UE/LE  There.ex.:  Walking in //-bars: forward (increase arm swing/ recip. Pattern)- 5 laps.  Walking in hallway (cuing to correct R toe drag).  2 laps  Standing toe and heel taps at 6" step (stairs with UE assist on handrails)- 20x each.  SBA for safety/ cuing.    Sit to stands from gray chair: 10x2 (no UE assist)  Seated hip flexion with added cone taps (alternating)- 20x.   Seated cone taps with color alternating instruction (L/R).  Fatigue noted in LE.    Walking marches in gym with SBA/CGA for safety and verbal cuing.  Agility ladder working on consistent step length with blue rungs on ladder.  Marked improvement in consistent recip. Gait pattern with increase reps at agility ladder.    Seated heel and toe raises in gray chair 20x.  Seated LAQ 20x.    Walking from gym to chair with cuing/ SBA and correction of hip flexion/ step pattern.  No LOB      PATIENT EDUCATION:  Education details: Access Code: [email or web address] Person educated: Patient Education method: Explanation, Demonstration, and Handouts Education comprehension: verbalized understanding and returned demonstration   HOME EXERCISE PROGRAM: Access Code: [facility] URL: [email or web address] Date: 09/13/2021 Prepared by: Dorcas Carrow  Exercises - Standing March with Unilateral Counter Support  - 1 x daily - 7 x weekly - 1 sets - 20 reps - Sit to Stand Without Arm Support  - 1 x daily - 7 x weekly - 1 sets - 20 reps - Supine Active Straight Leg Raise  - 1 x daily - 7 x weekly - 1 sets - 20 reps - Supine March  - 1 x daily - 7 x weekly - 1 sets - 20 reps - Seated Long Arc Quad  - 1 x daily - 7 x weekly -  1 sets - 20 reps  ASSESSMENT:  CLINICAL IMPRESSION: Pt. challenged with seated marching interventions incorporating the bosu ball, and has decreased R hip flexion strength. Pt works hard during tx session and is focused on consistent hip flexion/ heel strike with gait. Pt ambulates with pronated feet, with internal rotation of B feet as well. Pt continues to utilize his low back to compensate for decreased hip flexion, which is visible in seated marching interventions. Pt will benefit from skilled PT services to increase LE muscle strength to improve gait/ pain-free functional mobility, along with motor planning.    OBJECTIVE IMPAIRMENTS Abnormal gait, decreased activity tolerance, decreased balance, decreased endurance, decreased mobility, difficulty walking, decreased ROM, decreased strength, impaired flexibility, improper body mechanics, postural dysfunction, and pain.   ACTIVITY LIMITATIONS carrying, lifting, bending, standing, squatting, stairs, transfers, and locomotion level  PARTICIPATION LIMITATIONS: cleaning, community activity, occupation, and yard work  PERSONAL FACTORS Fitness, Past/current experiences, and Profession are also affecting patient's functional outcome.   REHAB POTENTIAL: Good  CLINICAL DECISION MAKING: Evolving/moderate complexity  EVALUATION COMPLEXITY: Moderate   GOALS: Goals reviewed  with patient? Yes  SHORT TERM GOALS: Target date: 10/11/21  Pt. Independent with HEP to increase B LE strength 1/2 muscle grade to improve standing/ independence with gait.   Baseline:  see above Goal status: INITIAL   LONG TERM GOALS: Target date: 11/08/21  Pt. Will increase FOTO to 58 to improve functional mobility.   Baseline: Initial FOTO 44 Goal status: INITIAL  2.  Pt. Will report no low back pain consistently for 1 week with all work-related/ household tasks.   Baseline: increase back pain with increase activity.   Goal status: INITIAL  3.  Pt. Will ambulate with  more normalized gait pattern with consistent hip flexion/ step length with least assistive device safely.   Baseline:  Goal status: INITIAL   PLAN: PT FREQUENCY: 2x/week  PT DURATION: 8 weeks  PLANNED INTERVENTIONS: Therapeutic exercises, Therapeutic activity, Neuromuscular re-education, Balance training, Gait training, Patient/Family education, Joint mobilization, Stair training, Spinal mobilization, Cryotherapy, Moist heat, and Manual therapy.  PLAN FOR NEXT SESSION: Progress HEP, gait and stair training.   Thornell Sartorius, SPT Cammie Mcgee, PT, DPT # [phone] 09/25/2021, 4:29 PM

## 2021-09-27 ENCOUNTER — Ambulatory Visit: Payer: Medicare Other | Admitting: Physical Therapy

## 2021-09-27 DIAGNOSIS — G8929 Other chronic pain: Secondary | ICD-10-CM

## 2021-09-27 DIAGNOSIS — M545 Low back pain, unspecified: Secondary | ICD-10-CM | POA: Diagnosis not present

## 2021-09-27 DIAGNOSIS — M2560 Stiffness of unspecified joint, not elsewhere classified: Secondary | ICD-10-CM

## 2021-09-27 DIAGNOSIS — M6281 Muscle weakness (generalized): Secondary | ICD-10-CM | POA: Diagnosis not present

## 2021-09-27 NOTE — Therapy (Addendum)
OUTPATIENT PHYSICAL THERAPY THORACOLUMBAR TREATMENT   Patient Name: Larry Blair MRN: [id number] DOB:[birth year], 66 y.o., male Today's Date: 09/27/2021   PT End of Session - 09/27/21 1344     Visit Number 5    Number of Visits 17    Date for PT Re-Evaluation 11/08/21    PT Start Time 1256    PT Stop Time 1343    PT Time Calculation (min) 47 min    Equipment Utilized During Treatment Gait belt    Activity Tolerance Patient tolerated treatment well    Behavior During Therapy WFL for tasks assessed/performed             Past Medical History:  Diagnosis Date   Allergy    Arthritis    right shoulder   Asthma    Eczema    HTN (hypertension)    Hyperlipidemia    Substance abuse ([facility]) 1976   daily   Past Surgical History:  Procedure Laterality Date   ANTERIOR CERVICAL DECOMP/DISCECTOMY FUSION N/A 08/30/2019   Procedure: CERVICAL THREE-FOUR ANTERIOR CERVICAL DECOMPRESSION/FUSION;  Surgeon: Tia Alert, MD;  Location: [facility];  Service: Neurosurgery;  Laterality: N/A;   COLONOSCOPY     KNEE ARTHROSCOPY Left    Patient Active Problem List   Diagnosis Date Noted   COPD with asthma ([facility]) 08/15/2021   Chronic bilateral low back pain without sciatica 08/15/2021   Need for prophylactic vaccination and inoculation against varicella 08/15/2021   Benign prostatic hyperplasia without lower urinary tract symptoms 10/03/2020   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   Diuretic-induced hypokalemia 11/24/2017   Chronic eczema of hand 08/11/2017   Hyperlipidemia LDL goal <70 07/01/2017   Routine general medical examination at a health care facility 06/30/2017   Essential hypertension 06/30/2017   Vitamin D deficiency 06/30/2017    PCP: Etta Grandchild, MD   REFERRING PROVIDER: Etta Grandchild, MD   REFERRING DIAG: Chronic bilateral low back pain without sciatica  Rationale for Evaluation and Treatment Rehabilitation  THERAPY DIAG:  Chronic bilateral low back pain without  sciatica  Joint stiffness  Muscle weakness (generalized)  ONSET DATE: 05/10/2019 (approximate date)- ACDF 08/30/19.    SUBJECTIVE:                                                                                                                                                                                           SUBJECTIVE STATEMENT:  Pt reports no pain today, and felt very strong at work yesterday when transitioning from seated on the ground to standing. No falls or LOB reported. Pt has been very active, and he reports that his L  knee has minimal pain overall. Pt stated that he has 2/10 LBP in the evenings after a long day of activity and caring for his wife, but it does not stop him from accomplishing tasks. Pt did not bring his L knee brace to tx session today, and ambulated in and out of the clinic without his SPC.   PERTINENT HISTORY:  ACDF, hx of knee scope on L knee.  Still utilizes knee brace. Hx of chronic ankle rolling bilaterally.   PAIN:  Are you having pain? Yes: NPRS scale: 0/10 Pain location: Low back Pain description: dull achy Aggravating factors: Prolonged standing at sink and carrying laundry Relieving factors: sitting, tylenol 650 mg 2 in morning   PRECAUTIONS: None  WEIGHT BEARING RESTRICTIONS No  FALLS:  Has patient fallen in last 6 months? 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    OBJECTIVE:   PATIENT SURVEYS:  FOTO initial 44/ goal 21  SCREENING FOR RED FLAGS: Bowel or bladder  incontinence: Yes: Hard to hold urine Spinal tumors: No Cauda equina syndrome: No Compression fracture: No Abdominal aneurysm: No  COGNITION:  Overall cognitive status: Within functional limits for tasks assessed     SENSATION: Light touch: Impaired  on L LE  POSTURE: rounded shoulders  LOWER/UPPER EXTREMITY ROM:     Limited L/R SLR in supine position secondary to muscle weakness.  B shoulder and knee/LE AROM WFL  LOWER/UPPER EXTREMITY MMT:       Difficulty lifting up R hip (flexion).    MMT Right eval Left eval  Hip flexion 3/5 3/5  Hip extension 4/5 4/5  Hip abduction 4+/5 4+/5  Hip adduction 4+/5 4+/5  Hip internal rotation 4/5 5/5  Hip external rotation 4/5 5/5  Knee flexion 4/5 4/5  Knee extension 4-/5 4-/5  Ankle dorsiflexion 4/5 4/5  Ankle plantarflexion 4/5 4/5  Ankle inversion    Ankle eversion     (Blank rows = not tested)    B shoulder strength grossly: flexion 4/5 MMT, abduction 3/5, extension 4/5, bicep 5/5, tricep 5/5, sh. IR 4/5, ER 5/5 MMT.  Grip L 72#/ R 70#.       GAIT: Distance walked: in clinic (decrease hip flexion/ step length).   Assistive device utilized: Single point cane Level of assistance: Modified independence    TX:  09/27/21:  Nustep L 5 10 mins B UE/LE   Manual Therapy:  Supine stretching of LE - knee flexion/ knee extension/ hip flexion/ hip ER/ HIP IR - 10 mins total  There ex:  Walking in hallway without SPC (cuing to correct R toe drag) 3 laps  Standing toe and heel taps at 6" step (stairs with UE assist on handrails)- 20x each.  SBA for safety/ cuing.   Seated BUSO taps - seated on chair with one foot tapping bosu infront of feet at a time - alternating 20 x per LE - 2 sets  Walking in // bars, stepping over 6" in 12" hurdles - verbal cueing to bring knees to  sky - 8x down and back   Standing basketball with squats after each throw - 24 squats; 18 baskets made   Side stepping onto and over 6 " step inside of //  bars - 20 x laterally    09/25/21  Nustep L5 10 mins B UE/LE (discussed weekend activities).    Manual Therapy:  Supine stretching of LE - knee flexion/ knee extension/ hip flexion/ hip ER/ HIP IR - 8 mins total  There ex:  Walking in agility ladder without SPC - one foot per square - down and back 6 times  Walking in hallway without SPC (cuing to correct R toe drag) 3 laps  Standing toe and heel taps at 6" step (stairs with UE assist on handrails)- 20x each.  SBA for safety/ cuing.   6" lateral step downs at staircase- tap foot 6" below laterally and come back up onto step laterally - 10x per LE SBA for safety/ cuing.   Seated heel and toe raises in gray chair 20x - 2 sets  Seated BUSO taps - seated on chair with one foot tapping bosu infront of feet at a time - alternating 10 x per LE   Seated marching with both feet on bosu- marching up off of bosu - 10 x - 2 sets    Blue ball seated with LAQ - 20 x - 3 sets  Blue ball seated marching - 20 x - 3 sets  Blue ball seated with 5# weight in hands - punching above head x 30    Verbal cueing required for heel-toe gait pattern, and increased hip flexion during gait activities    PATIENT EDUCATION:  Education details: Access Code: [email or web address] Person educated: Patient Education method: Explanation, Demonstration, and Handouts Education comprehension: verbalized understanding and returned demonstration   HOME EXERCISE PROGRAM: Access Code: [facility] URL: [email or web address] Date: 09/13/2021 Prepared by: Dorene Grebe  Exercises - Standing March with Unilateral Counter Support  - 1 x daily - 7 x weekly - 1 sets - 20 reps - Sit to Stand Without Arm Support  - 1 x daily - 7 x weekly - 1 sets - 20 reps - Supine Active Straight Leg Raise  - 1 x daily - 7 x weekly - 1 sets - 20 reps - Supine March  - 1 x daily - 7 x weekly - 1 sets - 20 reps - Seated Long Arc Quad  - 1 x daily - 7 x weekly - 1 sets - 20  reps  ASSESSMENT:  CLINICAL IMPRESSION: Pt. challenged with seated marching interventions incorporating the bosu ball, and has decreased R hip flexion strength. Pt works hard during tx session and is focused on consistent hip flexion/ heel strike with gait. Pt ambulates with pronated feet, with internal rotation of B feet as well. Pt continues to utilize his low back to compensate for decreased hip flexion, which is visible by extension during seated marching interventions and stepping over 12" hurdles. Pt was very determined to surpass tx expectations, and enjoyed playing basketball today. Pt will benefit from skilled PT services to increase LE muscle strength to improve gait/ pain-free functional mobility, along with motor planning.    OBJECTIVE IMPAIRMENTS Abnormal gait, decreased activity tolerance, decreased balance, decreased endurance, decreased mobility, difficulty walking, decreased ROM, decreased strength, impaired flexibility, improper body mechanics, postural dysfunction, and pain.   ACTIVITY LIMITATIONS carrying, lifting, bending, standing, squatting, stairs, transfers, and locomotion level  PARTICIPATION LIMITATIONS: cleaning, community activity, occupation, and yard work  PERSONAL  FACTORS Fitness, Past/current experiences, and Profession are also affecting patient's functional outcome.   REHAB POTENTIAL: Good  CLINICAL DECISION MAKING: Evolving/moderate complexity  EVALUATION COMPLEXITY: Moderate   GOALS: Goals reviewed with patient? Yes  SHORT TERM GOALS: Target date: 10/11/21  Pt. Independent with HEP to increase B LE strength 1/2 muscle grade to improve standing/ independence with gait.   Baseline:  see above Goal status: INITIAL   LONG TERM GOALS: Target date: 11/08/21  Pt. Will increase FOTO to 58 to improve functional mobility.   Baseline: Initial FOTO 44 Goal status: INITIAL  2.  Pt. Will report no low back pain consistently for 1 week with all work-related/  household tasks.   Baseline: increase back pain with increase activity.   Goal status: INITIAL  3.  Pt. Will ambulate with more normalized gait pattern with consistent hip flexion/ step length with least assistive device safely.   Baseline:  Goal status: INITIAL   PLAN: PT FREQUENCY: 2x/week  PT DURATION: 8 weeks  PLANNED INTERVENTIONS: Therapeutic exercises, Therapeutic activity, Neuromuscular re-education, Balance training, Gait training, Patient/Family education, Joint mobilization, Stair training, Spinal mobilization, Cryotherapy, Moist heat, and Manual therapy.  PLAN FOR NEXT SESSION: Progress HEP, gait and stair training. Motor planning.     Thornell Sartorius, SPT Cammie Mcgee, PT, DPT # [phone] 09/27/2021, 2:32 PM

## 2021-10-02 ENCOUNTER — Ambulatory Visit: Payer: Medicare Other | Admitting: Physical Therapy

## 2021-10-02 ENCOUNTER — Encounter: Payer: Self-pay | Admitting: Physical Therapy

## 2021-10-02 DIAGNOSIS — M2560 Stiffness of unspecified joint, not elsewhere classified: Secondary | ICD-10-CM

## 2021-10-02 DIAGNOSIS — G8929 Other chronic pain: Secondary | ICD-10-CM | POA: Diagnosis not present

## 2021-10-02 DIAGNOSIS — M6281 Muscle weakness (generalized): Secondary | ICD-10-CM | POA: Diagnosis not present

## 2021-10-02 DIAGNOSIS — M545 Low back pain, unspecified: Secondary | ICD-10-CM | POA: Diagnosis not present

## 2021-10-02 NOTE — Therapy (Addendum)
OUTPATIENT PHYSICAL THERAPY THORACOLUMBAR TREATMENT   Patient Name: Larry Blair MRN: [id number] DOB:23-Jun-1955, 66 y.o., male Today's Date: 10/02/2021   PT End of Session - 10/02/21 1627     Visit Number 6    Number of Visits 17    Date for PT Re-Evaluation 11/08/21    PT Start Time 1023    PT Stop Time 1113    PT Time Calculation (min) 50 min    Equipment Utilized During Treatment Gait belt    Activity Tolerance Patient tolerated treatment well    Behavior During Therapy WFL for tasks assessed/performed             Past Medical History:  Diagnosis Date   Allergy    Arthritis    right shoulder   Asthma    Eczema    HTN (hypertension)    Hyperlipidemia    Substance abuse (HCC) 1976   daily   Past Surgical History:  Procedure Laterality Date   ANTERIOR CERVICAL DECOMP/DISCECTOMY FUSION N/A 08/30/2019   Procedure: CERVICAL THREE-FOUR ANTERIOR CERVICAL DECOMPRESSION/FUSION;  Surgeon: Tia Alert, MD;  Location: [facility] OR;  Service: Neurosurgery;  Laterality: N/A;   COLONOSCOPY     KNEE ARTHROSCOPY Left    Patient Active Problem List   Diagnosis Date Noted   COPD with asthma (HCC) 08/15/2021   Chronic bilateral low back pain without sciatica 08/15/2021   Need for prophylactic vaccination and inoculation against varicella 08/15/2021   Benign prostatic hyperplasia without lower urinary tract symptoms 10/03/2020   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   Diuretic-induced hypokalemia 11/24/2017   Chronic eczema of hand 08/11/2017   Hyperlipidemia LDL goal <70 07/01/2017   Routine general medical examination at a health care facility 06/30/2017   Essential hypertension 06/30/2017   Vitamin D deficiency 06/30/2017    PCP: Etta Grandchild, MD   REFERRING PROVIDER: Etta Grandchild, MD   REFERRING DIAG: Chronic bilateral low back pain without sciatica  Rationale for Evaluation and Treatment Rehabilitation  THERAPY DIAG:  Chronic bilateral low back pain without  sciatica  Joint stiffness  Muscle weakness (generalized)  ONSET DATE: 05/10/2019 (approximate date)- ACDF 08/30/19.    SUBJECTIVE:                                                                                                                                                                                           SUBJECTIVE STATEMENT:  Pt reports no pain today, and had an active weekend at a graduation party. No falls or LOB reported. Pt reported that he helps his wife in and out of the bathtub, and often has trouble stepping  over he height of the tub. Pt did not wear his knee brace today, and has not needed it at work this past week.   PERTINENT HISTORY:  ACDF, hx of knee scope on L knee.  Still utilizes knee brace. Hx of chronic ankle rolling bilaterally.   PAIN:  Are you having pain? Yes: NPRS scale: 0/10 Pain location: Low back Pain description: dull achy Aggravating factors: Prolonged standing at sink and carrying laundry Relieving factors: sitting, tylenol 650 mg 2 in morning   PRECAUTIONS: None  WEIGHT BEARING RESTRICTIONS No  FALLS:  Has patient fallen in last 6 months? 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    OBJECTIVE:   PATIENT SURVEYS:  FOTO initial 44/ goal 60  SCREENING FOR RED FLAGS: Bowel or bladder incontinence: Yes: Hard to hold urine Spinal tumors: No Cauda equina syndrome: No Compression fracture: No Abdominal aneurysm: No  COGNITION:  Overall cognitive status: Within functional limits  for tasks assessed     SENSATION: Light touch: Impaired  on L LE  POSTURE: rounded shoulders  LOWER/UPPER EXTREMITY ROM:     Limited L/R SLR in supine position secondary to muscle weakness.  B shoulder and knee/LE AROM WFL  LOWER/UPPER EXTREMITY MMT:       Difficulty lifting up R hip (flexion).    MMT Right eval Left eval  Hip flexion 3/5 3/5  Hip extension 4/5 4/5  Hip abduction 4+/5 4+/5  Hip adduction 4+/5 4+/5  Hip internal rotation 4/5 5/5  Hip external rotation 4/5 5/5  Knee flexion 4/5 4/5  Knee extension 4-/5 4-/5  Ankle dorsiflexion 4/5 4/5  Ankle plantarflexion 4/5 4/5  Ankle inversion    Ankle eversion     (Blank rows = not tested)    B shoulder strength grossly: flexion 4/5 MMT, abduction 3/5, extension 4/5, bicep 5/5, tricep 5/5, sh. IR 4/5, ER 5/5 MMT.  Grip L 72#/ R 70#.       GAIT: Distance walked: in clinic (decrease hip flexion/ step length).   Assistive device utilized: Single point cane Level of assistance: Modified independence    TX:  10/02/21: Nustep level 5, 10 mins B UE/LE  There ex: Walking down and back in // bars - 4 x Marching in // bars - 4 x down/back Standing lunges in // bars - 10 x per LE Seated marching with 4# ankle weights 40 x B Seated heel/toe weight shifting with 4# ankle weights 40 x B Walking in // bars with 4# ankle weights - 4 x down and back  Walking outside without SPC around entire building  Standing squats in // bars - 20 x Seated blue ball balance exercises with LAQ and marching - 20 x B each - 2 sets Seated blue ball shoulder punches at 90 degrees of flexion with 5# weight in each hand - 60 seconds x 2 Single leg stance for 60 s per LE  Supine stretching of LE - knee flexion/hip flexion/ hip ER/ HIP IR  Verbal cueing required  for heel-toe gait pattern, and increased hip flexion during gait activities   09/27/21:  Nustep L 5 10 mins B UE/LE   Manual Therapy:  Supine stretching of LE - knee flexion/  knee extension/ hip flexion/ hip ER/ HIP IR - 10 mins total  There ex:  Walking in hallway without SPC (cuing to correct R toe drag) 3 laps  Standing toe and heel taps at 6" step (stairs with UE assist on handrails)- 20x each.  SBA for safety/ cuing.   Seated BUSO taps - seated on chair with one foot tapping bosu infront of feet at a time - alternating 20 x per LE - 2 sets  Walking in // bars, stepping over 6" in 12" hurdles - verbal cueing to bring knees to sky - 8x down and back   Standing basketball with squats after each throw - 24 squats; 18 baskets made   Side stepping onto and over 6 " step inside of // bars - 20 x laterally   PATIENT EDUCATION:  Education details: Access Code: [email or web address] Person educated: Patient Education method: Explanation, Demonstration, and Handouts Education comprehension: verbalized understanding and returned demonstration   HOME EXERCISE PROGRAM: Access Code: [facility] URL: [email or web address] Date: 09/13/2021 Prepared by: Dorene Grebe  Exercises - Standing March with Unilateral Counter Support  - 1 x daily - 7 x weekly - 1 sets - 20 reps - Sit to Stand Without Arm Support  - 1 x daily - 7 x weekly - 1 sets - 20 reps - Supine Active Straight Leg Raise  - 1 x daily - 7 x weekly - 1 sets - 20 reps - Supine March  - 1 x daily - 7 x weekly - 1 sets - 20 reps - Seated Long Arc Quad  - 1 x daily - 7 x weekly - 1 sets - 20 reps  ASSESSMENT:  CLINICAL IMPRESSION: Pt responded well to verbal cueing, and ambulated with a reciprocal gait pattern without his SPC. Pt ambulates with pronated feet, with internal rotation of B feet as well. Pt exhibits R foot slap after fatiguing during ambulation, and requires verbal cueing to focus on proper gait mechanics. Pt was very determined to surpass tx expectations, and stated that he "walked the furthest that he has walked in years without his SPC outside today". Pt's step length was longer with the 4#  weights around his ankles, and he did not exhibit a lateral sway while walking with the 4# ankle weights. Pt will benefit from skilled PT services to increase LE muscle strength to improve gait/ pain-free functional mobility, along with motor planning.    OBJECTIVE IMPAIRMENTS Abnormal gait, decreased activity tolerance, decreased balance, decreased endurance, decreased mobility, difficulty walking, decreased ROM, decreased strength, impaired flexibility, improper body mechanics, postural dysfunction, and pain.   ACTIVITY LIMITATIONS carrying, lifting, bending, standing, squatting, stairs, transfers, and locomotion level  PARTICIPATION LIMITATIONS: cleaning, community activity, occupation, and yard work  PERSONAL FACTORS Fitness, Past/current experiences, and Profession are also affecting patient's functional outcome.   REHAB POTENTIAL: Good  CLINICAL DECISION MAKING: Evolving/moderate complexity  EVALUATION COMPLEXITY: Moderate   GOALS: Goals reviewed with patient? Yes  SHORT TERM GOALS: Target date: 10/11/21  Pt. Independent with HEP to increase B LE strength 1/2 muscle grade to improve standing/ independence with gait.   Baseline:  see above Goal status: INITIAL   LONG TERM GOALS: Target date: 11/08/21  Pt. Will increase FOTO to 58 to improve functional mobility.   Baseline: Initial FOTO 44  Goal status: INITIAL  2.  Pt. Will report no low back pain consistently for 1 week with all work-related/ household tasks.   Baseline: increase back pain with increase activity.   Goal status: INITIAL  3.  Pt. Will ambulate with more normalized gait pattern with consistent hip flexion/ step length with least assistive device safely.   Baseline:  Goal status: INITIAL   PLAN: PT FREQUENCY: 2x/week  PT DURATION: 8 weeks  PLANNED INTERVENTIONS: Therapeutic exercises, Therapeutic activity, Neuromuscular re-education, Balance training, Gait training, Patient/Family education, Joint  mobilization, Stair training, Spinal mobilization, Cryotherapy, Moist heat, and Manual therapy.  PLAN FOR NEXT SESSION: Progress HEP, gait and stair training. Motor planning.     Cammie Mcgee, PT, DPT # [id number] Thornell Sartorius, SPT 10/02/2021, 5:10 PM

## 2021-10-04 ENCOUNTER — Ambulatory Visit: Payer: Medicare Other | Admitting: Physical Therapy

## 2021-10-04 ENCOUNTER — Encounter: Payer: Self-pay | Admitting: Physical Therapy

## 2021-10-04 DIAGNOSIS — M545 Low back pain, unspecified: Secondary | ICD-10-CM

## 2021-10-04 DIAGNOSIS — M2560 Stiffness of unspecified joint, not elsewhere classified: Secondary | ICD-10-CM | POA: Diagnosis not present

## 2021-10-04 DIAGNOSIS — M6281 Muscle weakness (generalized): Secondary | ICD-10-CM

## 2021-10-04 DIAGNOSIS — G8929 Other chronic pain: Secondary | ICD-10-CM | POA: Diagnosis not present

## 2021-10-04 NOTE — Therapy (Addendum)
OUTPATIENT PHYSICAL THERAPY THORACOLUMBAR TREATMENT   Patient Name: Larry Blair MRN: [id number] DOB:05/06/1955, 66 y.o., male Today's Date: 10/04/2021   PT End of Session - 10/04/21 1653     Visit Number 7    Number of Visits 17    Date for PT Re-Evaluation 11/08/21    PT Start Time 1113    PT Stop Time 1157    PT Time Calculation (min) 44 min    Equipment Utilized During Treatment Gait belt    Activity Tolerance Patient tolerated treatment well    Behavior During Therapy WFL for tasks assessed/performed             Past Medical History:  Diagnosis Date   Allergy    Arthritis    right shoulder   Asthma    Eczema    HTN (hypertension)    Hyperlipidemia    Substance abuse (HCC) 1976   daily   Past Surgical History:  Procedure Laterality Date   ANTERIOR CERVICAL DECOMP/DISCECTOMY FUSION N/A 08/30/2019   Procedure: CERVICAL THREE-FOUR ANTERIOR CERVICAL DECOMPRESSION/FUSION;  Surgeon: Tia Alert, MD;  Location: [facility] OR;  Service: Neurosurgery;  Laterality: N/A;   COLONOSCOPY     KNEE ARTHROSCOPY Left    Patient Active Problem List   Diagnosis Date Noted   COPD with asthma (HCC) 08/15/2021   Chronic bilateral low back pain without sciatica 08/15/2021   Need for prophylactic vaccination and inoculation against varicella 08/15/2021   Benign prostatic hyperplasia without lower urinary tract symptoms 10/03/2020   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   Diuretic-induced hypokalemia 11/24/2017   Chronic eczema of hand 08/11/2017   Hyperlipidemia LDL goal <70 07/01/2017   Routine general medical examination at a health care facility 06/30/2017   Essential hypertension 06/30/2017   Vitamin D deficiency 06/30/2017    PCP: Etta Grandchild, MD   REFERRING PROVIDER: Etta Grandchild, MD   REFERRING DIAG: Chronic bilateral low back pain without sciatica  Rationale for Evaluation and Treatment Rehabilitation  THERAPY DIAG:  Chronic bilateral low back pain without  sciatica  Joint stiffness  Muscle weakness (generalized)  ONSET DATE: 05/10/2019 (approximate date)- ACDF 08/30/19.    SUBJECTIVE:                                                                                                                                                                                           SUBJECTIVE STATEMENT:  Pt reports no pain today, and has been compliant with HEP. No falls or LOB reported. Pt stated that walking has become much more efficient for him.   PERTINENT HISTORY:  ACDF, hx of knee scope  on L knee.  Still utilizes knee brace. Hx of chronic ankle rolling bilaterally.   PAIN:  Are you having pain? Yes: NPRS scale: 0/10 Pain location: Low back Pain description: dull achy Aggravating factors: Prolonged standing at sink and carrying laundry Relieving factors: sitting, tylenol 650 mg 2 in morning   PRECAUTIONS: None  WEIGHT BEARING RESTRICTIONS No  FALLS:  Has patient fallen in last 6 months? 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    OBJECTIVE:   PATIENT SURVEYS:  FOTO initial 44/ goal 72  SCREENING FOR RED FLAGS: Bowel or bladder incontinence: Yes: Hard to hold urine Spinal tumors: No Cauda equina syndrome: No Compression fracture: No Abdominal aneurysm: No  COGNITION:  Overall cognitive status: Within functional limits for tasks assessed     SENSATION: Light touch: Impaired  on L LE  POSTURE: rounded shoulders  LOWER/UPPER EXTREMITY ROM:     Limited L/R SLR in  supine position secondary to muscle weakness.  B shoulder and knee/LE AROM WFL  LOWER/UPPER EXTREMITY MMT:       Difficulty lifting up R hip (flexion).    MMT Right eval Left eval  Hip flexion 3/5 3/5  Hip extension 4/5 4/5  Hip abduction 4+/5 4+/5  Hip adduction 4+/5 4+/5  Hip internal rotation 4/5 5/5  Hip external rotation 4/5 5/5  Knee flexion 4/5 4/5  Knee extension 4-/5 4-/5  Ankle dorsiflexion 4/5 4/5  Ankle plantarflexion 4/5 4/5  Ankle inversion    Ankle eversion     (Blank rows = not tested)    B shoulder strength grossly: flexion 4/5 MMT, abduction 3/5, extension 4/5, bicep 5/5, tricep 5/5, sh. IR 4/5, ER 5/5 MMT.  Grip L 72#/ R 70#.       GAIT: Distance walked: in clinic (decrease hip flexion/ step length).   Assistive device utilized: Single point cane Level of assistance: Modified independence    TX:  10/04/21: Supine stretching of LE - knee flexion/hip flexion/ hip ER/ HIP IR  There ex: Walking down and back in // bars - 4 x  Marching in // bars - 4 x down/back  Standing lunges in // bars - 10 x per LE  Standing squats in // bars - 20 x  Stepping over 6" hurdles x 18 in // bars - forward, backward, and laterally  Walking with nautilus belt around waist - 70# backward walking, 50# lateral stepping to L and R, 50# forward walking 3x per direction  Walking heel/toe on blue airex balance beam in // bars - 8 x back and fourth   Hip flexion and hip extension with green TB around ankles - 10 x each direction  Side steps with green TB around ankles in // bars - down and back 4 x  Monster walks with green TB aorund abkles - 1 x down // bars    10/02/21: Nustep level 5, 10 mins B UE/LE  There ex: Walking down and back in // bars - 4 x Marching in // bars -  4 x down/back Standing lunges in // bars - 10 x per LE Seated marching with 4# ankle weights 40 x B Seated heel/toe weight shifting with 4# ankle weights 40 x B Walking in // bars with 4# ankle  weights - 4 x down and back  Walking outside without SPC around entire building  Standing squats in // bars - 20 x Seated blue ball balance exercises with LAQ and marching - 20 x B each - 2 sets Seated blue ball shoulder punches at 90 degrees of flexion with 5# weight in each hand - 60 seconds x 2 Single leg stance for 60 s per LE  Supine stretching of LE - knee flexion/hip flexion/ hip ER/ HIP IR  Verbal cueing required for heel-toe gait pattern, and increased hip flexion during gait activities   PATIENT EDUCATION:  Education details: Access Code: [email or web address] Person educated: Patient Education method: Explanation, Demonstration, and Handouts Education comprehension: verbalized understanding and returned demonstration   HOME EXERCISE PROGRAM: Access Code: [facility] URL: [email or web address] Date: 09/13/2021 Prepared by: Dorene Grebe  Exercises - Standing March with Unilateral Counter Support  - 1 x daily - 7 x weekly - 1 sets - 20 reps - Sit to Stand Without Arm Support  - 1 x daily - 7 x weekly - 1 sets - 20 reps - Supine Active Straight Leg Raise  - 1 x daily - 7 x weekly - 1 sets - 20 reps - Supine March  - 1 x daily - 7 x weekly - 1 sets - 20 reps - Seated Long Arc Quad  - 1 x daily - 7 x weekly - 1 sets - 20 reps  ASSESSMENT:  CLINICAL IMPRESSION: Pt responded well to verbal cueing, and ambulated with a reciprocal gait pattern without his SPC throughout the clinic. Pt found the 6" hurdles to be much easier than they felt in the past, and was able to step over them laterally and backwards, which he had never been able to do prior to today's tx session. Pt also enjoyed the nautilus, and stated that it reminded him of the wagon that he utilizes to bring groceries into his home (which he finds difficult at home). Pt will benefit from skilled PT services to increase LE muscle strength to improve gait/ pain-free functional mobility, along with motor planning.     OBJECTIVE IMPAIRMENTS Abnormal gait, decreased activity tolerance, decreased balance, decreased endurance, decreased mobility, difficulty walking, decreased ROM, decreased strength, impaired flexibility, improper body mechanics, postural dysfunction, and pain.   ACTIVITY LIMITATIONS carrying, lifting, bending, standing, squatting, stairs, transfers, and locomotion level  PARTICIPATION LIMITATIONS: cleaning, community activity, occupation, and yard work  PERSONAL FACTORS Fitness, Past/current experiences, and Profession are also affecting patient's functional outcome.   REHAB POTENTIAL: Good  CLINICAL DECISION MAKING: Evolving/moderate complexity  EVALUATION COMPLEXITY: Moderate   GOALS: Goals reviewed with patient? Yes  SHORT TERM GOALS: Target date: 10/11/21  Pt. Independent with HEP to increase B LE strength 1/2 muscle grade to improve standing/ independence with gait.   Baseline:  see above Goal status: INITIAL   LONG TERM GOALS: Target date: 11/08/21  Pt. Will increase FOTO to 58 to improve functional mobility.   Baseline: Initial FOTO 44 Goal status: INITIAL  2.  Pt. Will report no low back pain consistently for 1 week with all work-related/ household tasks.   Baseline: increase back pain with increase activity.   Goal status: INITIAL  3.  Pt. Will ambulate with more normalized gait pattern with  consistent hip flexion/ step length with least assistive device safely.   Baseline:  Goal status: INITIAL   PLAN: PT FREQUENCY: 2x/week  PT DURATION: 8 weeks  PLANNED INTERVENTIONS: Therapeutic exercises, Therapeutic activity, Neuromuscular re-education, Balance training, Gait training, Patient/Family education, Joint mobilization, Stair training, Spinal mobilization, Cryotherapy, Moist heat, and Manual therapy.  PLAN FOR NEXT SESSION: Progress HEP, gait and stair training. Motor planning.     Cammie Mcgee, PT, DPT # [id number] Thornell Sartorius, SPT 10/04/2021, 5:13 PM

## 2021-10-08 ENCOUNTER — Encounter: Payer: Self-pay | Admitting: Physical Therapy

## 2021-10-08 ENCOUNTER — Ambulatory Visit: Payer: Medicare Other | Attending: Internal Medicine | Admitting: Physical Therapy

## 2021-10-08 DIAGNOSIS — M545 Low back pain, unspecified: Secondary | ICD-10-CM | POA: Insufficient documentation

## 2021-10-08 DIAGNOSIS — M6281 Muscle weakness (generalized): Secondary | ICD-10-CM | POA: Insufficient documentation

## 2021-10-08 DIAGNOSIS — G8929 Other chronic pain: Secondary | ICD-10-CM | POA: Diagnosis not present

## 2021-10-08 DIAGNOSIS — M2560 Stiffness of unspecified joint, not elsewhere classified: Secondary | ICD-10-CM | POA: Diagnosis not present

## 2021-10-08 NOTE — Therapy (Addendum)
OUTPATIENT PHYSICAL THERAPY THORACOLUMBAR TREATMENT   Patient Name: Larry Blair MRN: [id number] DOB:12/06/1955, 66 y.o., male Today's Date: 10/08/2021   PT End of Session - 10/08/21 1307     Visit Number 8    Number of Visits 17    Date for PT Re-Evaluation 11/08/21    PT Start Time 1307    PT Stop Time 1358    PT Time Calculation (min) 51 min    Activity Tolerance Patient tolerated treatment well    Behavior During Therapy [facility] for tasks assessed/performed            Past Medical History:  Diagnosis Date   Allergy    Arthritis    right shoulder   Asthma    Eczema    HTN (hypertension)    Hyperlipidemia    Substance abuse ([place]) 1976   daily   Past Surgical History:  Procedure Laterality Date   ANTERIOR CERVICAL DECOMP/DISCECTOMY FUSION N/A 08/30/2019   Procedure: CERVICAL THREE-FOUR ANTERIOR CERVICAL DECOMPRESSION/FUSION;  Surgeon: Eustace Moore, MD;  Location: [place];  Service: Neurosurgery;  Laterality: N/A;   COLONOSCOPY     KNEE ARTHROSCOPY Left    Patient Active Problem List   Diagnosis Date Noted   COPD with asthma ([place]) 08/15/2021   Chronic bilateral low back pain without sciatica 08/15/2021   Need for prophylactic vaccination and inoculation against varicella 08/15/2021   Benign prostatic hyperplasia without lower urinary tract symptoms 10/03/2020   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   Diuretic-induced hypokalemia 11/24/2017   Chronic eczema of hand 08/11/2017   Hyperlipidemia LDL goal <70 07/01/2017   Routine general medical examination at a health care facility 06/30/2017   Essential hypertension 06/30/2017   Vitamin D deficiency 06/30/2017    PCP: Janith Lima, MD   REFERRING PROVIDER: Janith Lima, MD   REFERRING DIAG: Chronic bilateral low back pain without sciatica  Rationale for Evaluation and Treatment Rehabilitation  THERAPY DIAG:  Chronic bilateral low back pain without sciatica  Joint stiffness  Muscle weakness  (generalized)  ONSET DATE: 05/10/2019 (approximate date)- ACDF 08/30/19.    SUBJECTIVE:                                                                                                                                                                                           SUBJECTIVE STATEMENT:  Pt reports no pain today, and has been compliant with HEP. Pt hurt his L shoulder this weekend fixing a broken TV in his home, so he has been less active than he typically is. Pt stated that walking has become much more efficient for him, and  he has been able to stand on his tip toes independently for the first time since his injury.  PERTINENT HISTORY:  ACDF, hx of knee scope on L knee.  Still utilizes knee brace. Hx of chronic ankle rolling bilaterally.   PAIN:  Are you having pain? Yes: NPRS scale: 0/10 Pain location: Low back Pain description: dull achy Aggravating factors: Prolonged standing at sink and carrying laundry Relieving factors: sitting, tylenol 650 mg 2 in morning   PRECAUTIONS: None  WEIGHT BEARING RESTRICTIONS No  FALLS:  Has patient fallen in last 6 months? 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    OBJECTIVE:   PATIENT SURVEYS:  FOTO initial 44/ goal 32  SCREENING FOR RED FLAGS: Bowel or bladder incontinence: Yes: Hard to hold urine Spinal tumors: No Cauda equina syndrome: No Compression fracture: No Abdominal aneurysm: No  COGNITION:  Overall cognitive status: Within functional limits for tasks  assessed     SENSATION: Light touch: Impaired  on L LE  POSTURE: rounded shoulders  LOWER/UPPER EXTREMITY ROM:     Limited L/R SLR in supine position secondary to muscle weakness.  B shoulder and knee/LE AROM WFL  LOWER/UPPER EXTREMITY MMT:       Difficulty lifting up R hip (flexion).    MMT Right eval Left eval  Hip flexion 3/5 3/5  Hip extension 4/5 4/5  Hip abduction 4+/5 4+/5  Hip adduction 4+/5 4+/5  Hip internal rotation 4/5 5/5  Hip external rotation 4/5 5/5  Knee flexion 4/5 4/5  Knee extension 4-/5 4-/5  Ankle dorsiflexion 4/5 4/5  Ankle plantarflexion 4/5 4/5  Ankle inversion    Ankle eversion     (Blank rows = not tested)    B shoulder strength grossly: flexion 4/5 MMT, abduction 3/5, extension 4/5, bicep 5/5, tricep 5/5, sh. IR 4/5, ER 5/5 MMT.  Grip L 72#/ R 70#.       GAIT: Distance walked: in clinic (decrease hip flexion/ step length).   Assistive device utilized: Single point cane Level of assistance: Modified independence    TX:  10/08/21: Nustep level 5 10 mins - UE/LE  There ex: Walking in // bars - down and back 6x  Marching in // bars - 4 x down/back  Standing squats in // bars -10 x  Stepping over 6" and 12" hurdles x 18 in // bars - forward  Stepping over 6" and 12" hurdles x 18 in // bars - forward with 4# ankle weights  Toe taps onto bosu ball in // bars 30 x per LE  Standing balance on bosu ball with two feet in // bars - no hands 2 x 40 seconds - close guard  Weight shifting laterally on bosu ball in // bars - close guard  Walking heel/toe on blue airex balance beam in // bars - 8 x back and fourth   Hypervolt to L traps, rhomboids, and L deltoid - level 1   10/04/21: Supine stretching of LE - knee flexion/hip flexion/ hip ER/  HIP IR  There ex: Walking down and back in // bars - 4 x  Marching in // bars - 4 x down/back  Standing lunges in // bars - 10 x per LE  Standing squats in // bars - 20 x  Stepping over 6"  hurdles x 18 in // bars - forward, backward, and laterally  Walking with nautilus belt around waist - 70# backward walking, 50# lateral stepping to L and R, 50# forward walking 3x per direction  Walking heel/toe on blue airex balance beam in // bars - 8 x back and fourth   Hip flexion and hip extension with green TB around ankles - 10 x each direction  Side steps with green TB around ankles in // bars - down and back 4 x  Monster walks with green TB aorund abkles - 1 x down // bars    PATIENT EDUCATION:  Education details: Access Code: [employer] Person educated: Patient Education method: Explanation, Demonstration, and Handouts Education comprehension: verbalized understanding and returned demonstration   HOME EXERCISE PROGRAM: Access Code: [facility] URL: [email or web address] Date: 09/13/2021 Prepared by: Dorcas Carrow  Exercises - Standing March with Unilateral Counter Support  - 1 x daily - 7 x weekly - 1 sets - 20 reps - Sit to Stand Without Arm Support  - 1 x daily - 7 x weekly - 1 sets - 20 reps - Supine Active Straight Leg Raise  - 1 x daily - 7 x weekly - 1 sets - 20 reps - Supine March  - 1 x daily - 7 x weekly - 1 sets - 20 reps - Seated Long Arc Quad  - 1 x daily - 7 x weekly - 1 sets - 20 reps  ASSESSMENT:  CLINICAL IMPRESSION: Pt responded well to verbal cueing, and ambulated with a reciprocal gait pattern without his SPC throughout the clinic. Pt felt relief in his shoulder from the hypervolt, and stated that it decreased tension and tightness within his L posterior and lateral shoulder. Pt also demonstrated increased balance throughout tx, and demonstrated increased hip flexion with 4# ankle weights over 6" and 12" hurdles. Pt will benefit from skilled PT services to increase LE muscle strength to improve gait/ pain-free functional mobility, along with motor planning.    OBJECTIVE IMPAIRMENTS Abnormal gait, decreased activity tolerance, decreased  balance, decreased endurance, decreased mobility, difficulty walking, decreased ROM, decreased strength, impaired flexibility, improper body mechanics, postural dysfunction, and pain.   ACTIVITY LIMITATIONS carrying, lifting, bending, standing, squatting, stairs, transfers, and locomotion level  PARTICIPATION LIMITATIONS: cleaning, community activity, occupation, and yard work  PERSONAL FACTORS Fitness, Past/current experiences, and Profession are also affecting patient's functional outcome.   REHAB POTENTIAL: Good  CLINICAL DECISION MAKING: Evolving/moderate complexity  EVALUATION COMPLEXITY: Moderate   GOALS: Goals reviewed with patient? Yes  SHORT TERM GOALS: Target date: 10/11/21  Pt. Independent with HEP to increase B LE strength 1/2 muscle grade to improve standing/ independence with gait.   Baseline:  see above Goal status: INITIAL   LONG TERM GOALS: Target date: 11/08/21  Pt. Will increase FOTO to 58 to improve functional mobility.   Baseline: Initial FOTO 44 Goal status: INITIAL  2.  Pt. Will report no low back pain consistently for 1 week with all work-related/ household tasks.   Baseline: increase back pain with increase activity.   Goal status: INITIAL  3.  Pt. Will ambulate with more normalized gait pattern with consistent hip flexion/ step length with least assistive device safely.  Baseline:  Goal status: INITIAL   PLAN: PT FREQUENCY: 2x/week  PT DURATION: 8 weeks  PLANNED INTERVENTIONS: Therapeutic exercises, Therapeutic activity, Neuromuscular re-education, Balance training, Gait training, Patient/Family education, Joint mobilization, Stair training, Spinal mobilization, Cryotherapy, Moist heat, and Manual therapy.  PLAN FOR NEXT SESSION: Progress HEP, gait and stair training. Motor planning.     Cammie Mcgee, PT, DPT # [id number] Thornell Sartorius, SPT 10/08/2021, 3:07 PM

## 2021-10-11 ENCOUNTER — Encounter: Payer: Self-pay | Admitting: Physical Therapy

## 2021-10-11 ENCOUNTER — Ambulatory Visit: Payer: Medicare Other | Admitting: Physical Therapy

## 2021-10-11 DIAGNOSIS — M2560 Stiffness of unspecified joint, not elsewhere classified: Secondary | ICD-10-CM

## 2021-10-11 DIAGNOSIS — M545 Low back pain, unspecified: Secondary | ICD-10-CM

## 2021-10-11 DIAGNOSIS — G8929 Other chronic pain: Secondary | ICD-10-CM | POA: Diagnosis not present

## 2021-10-11 DIAGNOSIS — M6281 Muscle weakness (generalized): Secondary | ICD-10-CM | POA: Diagnosis not present

## 2021-10-11 NOTE — Therapy (Addendum)
OUTPATIENT PHYSICAL THERAPY THORACOLUMBAR TREATMENT   Patient Name: Larry Blair MRN: [id number] DOB:February 26, 1956, 66 y.o., male Today's Date: 10/11/2021   PT End of Session - 10/11/21 1312     Visit Number 9    Number of Visits 17    Date for PT Re-Evaluation 11/08/21    PT Start Time 1031    PT Stop Time 1111    PT Time Calculation (min) 40 min    Activity Tolerance Patient tolerated treatment well    Behavior During Therapy [facility] for tasks assessed/performed             Past Medical History:  Diagnosis Date   Allergy    Arthritis    right shoulder   Asthma    Eczema    HTN (hypertension)    Hyperlipidemia    Substance abuse ([place]) 1976   daily   Past Surgical History:  Procedure Laterality Date   ANTERIOR CERVICAL DECOMP/DISCECTOMY FUSION N/A 08/30/2019   Procedure: CERVICAL THREE-FOUR ANTERIOR CERVICAL DECOMPRESSION/FUSION;  Surgeon: Eustace Moore, MD;  Location: [place];  Service: Neurosurgery;  Laterality: N/A;   COLONOSCOPY     KNEE ARTHROSCOPY Left    Patient Active Problem List   Diagnosis Date Noted   COPD with asthma ([place]) 08/15/2021   Chronic bilateral low back pain without sciatica 08/15/2021   Need for prophylactic vaccination and inoculation against varicella 08/15/2021   Benign prostatic hyperplasia without lower urinary tract symptoms 10/03/2020   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   Diuretic-induced hypokalemia 11/24/2017   Chronic eczema of hand 08/11/2017   Hyperlipidemia LDL goal <70 07/01/2017   Routine general medical examination at a health care facility 06/30/2017   Essential hypertension 06/30/2017   Vitamin D deficiency 06/30/2017    PCP: Janith Lima, MD   REFERRING PROVIDER: Janith Lima, MD   REFERRING DIAG: Chronic bilateral low back pain without sciatica  Rationale for Evaluation and Treatment Rehabilitation  THERAPY DIAG:  Chronic bilateral low back pain without sciatica  Joint stiffness  Muscle weakness  (generalized)  ONSET DATE: 05/10/2019 (approximate date)- ACDF 08/30/19.    SUBJECTIVE:                                                                                                                                                                                           SUBJECTIVE STATEMENT:  Pt reports no pain today, and has been compliant with HEP. Pt's shoulder has been feeling better, and he takes two tylenols per day for it. Heat has also helped the shoulder, and he prefers the heat over ice. Pt has the weekend off of work,  and is looking forward to working in his garden.  PERTINENT HISTORY:  ACDF, hx of knee scope on L knee.  Still utilizes knee brace. Hx of chronic ankle rolling bilaterally.   PAIN:  Are you having pain? Yes: NPRS scale: 0/10 Pain location: Low back Pain description: dull achy Aggravating factors: Prolonged standing at sink and carrying laundry Relieving factors: sitting, tylenol 650 mg 2 in morning   PRECAUTIONS: None  WEIGHT BEARING RESTRICTIONS No  FALLS:  Has patient fallen in last 6 months? 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    OBJECTIVE:   PATIENT SURVEYS:  FOTO initial 44/ goal 70  SCREENING FOR RED FLAGS: Bowel or bladder incontinence: Yes: Hard to hold urine Spinal tumors: No Cauda equina syndrome: No Compression fracture: No Abdominal aneurysm: No  COGNITION:  Overall cognitive status: Within functional limits for tasks assessed     SENSATION: Light touch: Impaired  on  L LE  POSTURE: rounded shoulders  LOWER/UPPER EXTREMITY ROM:     Limited L/R SLR in supine position secondary to muscle weakness.  B shoulder and knee/LE AROM WFL  LOWER/UPPER EXTREMITY MMT:       Difficulty lifting up R hip (flexion).    MMT Right eval Left eval  Hip flexion 3/5 3/5  Hip extension 4/5 4/5  Hip abduction 4+/5 4+/5  Hip adduction 4+/5 4+/5  Hip internal rotation 4/5 5/5  Hip external rotation 4/5 5/5  Knee flexion 4/5 4/5  Knee extension 4-/5 4-/5  Ankle dorsiflexion 4/5 4/5  Ankle plantarflexion 4/5 4/5  Ankle inversion    Ankle eversion     (Blank rows = not tested)    B shoulder strength grossly: flexion 4/5 MMT, abduction 3/5, extension 4/5, bicep 5/5, tricep 5/5, sh. IR 4/5, ER 5/5 MMT.  Grip L 72#/ R 70#.       GAIT: Distance walked: in clinic (decrease hip flexion/ step length).   Assistive device utilized: Single point cane Level of assistance: Modified independence    TX:  Nustep level 6 - 10 mins - UE/LE  There ex:  Walking down and back in // bars - 4 x  Walking lunges 5 x down and back    Marching in // bars - 4 x down/back  Walking with nautilus belt around waist - 70# backward walking, 70# lateral stepping to L and R, 70# forward walking 4x per direction  Walking in hallway - with emphasis on reciprocal arm swing - 3 x down and back in hallway   Walking with cone taps in hallway 20 x cone taps per LE     10/08/21: Nustep level 5 10 mins - UE/LE  There ex: Walking in // bars - down and back 6x  Marching in // bars - 4 x down/back  Standing squats in // bars -10 x  Stepping over 6" and 12" hurdles x 18 in // bars - forward  Stepping over 6" and 12" hurdles x 18 in // bars - forward with 4#  ankle weights  Toe taps onto bosu ball in // bars 30 x per LE  Standing balance on bosu ball with two feet in // bars - no hands 2 x 40 seconds - close guard  Weight shifting laterally on bosu ball in // bars - close  guard  Walking heel/toe on blue airex balance beam in // bars - 8 x back and fourth   Hypervolt to L traps, rhomboids, and L deltoid - level 1   PATIENT EDUCATION:  Education details: Access Code: [email or web address] Person educated: Patient Education method: Explanation, Demonstration, and Handouts Education comprehension: verbalized understanding and returned demonstration   HOME EXERCISE PROGRAM: Access Code: [facility] URL: [email or web address] Date: 09/13/2021 Prepared by: Dorene Grebe  Exercises - Standing March with Unilateral Counter Support  - 1 x daily - 7 x weekly - 1 sets - 20 reps - Sit to Stand Without Arm Support  - 1 x daily - 7 x weekly - 1 sets - 20 reps - Supine Active Straight Leg Raise  - 1 x daily - 7 x weekly - 1 sets - 20 reps - Supine March  - 1 x daily - 7 x weekly - 1 sets - 20 reps - Seated Long Arc Quad  - 1 x daily - 7 x weekly - 1 sets - 20 reps  ASSESSMENT:  CLINICAL IMPRESSION: Pt responded well to verbal cueing, and ambulated with a reciprocal gait pattern without his SPC throughout the clinic. Pt was challenged more when balancing on R LE during cone tape than he was while balancing on his L LE, but did not demonstrate significant LOB. Pt tolerated increased weight and reps on nautilus without any increased pain or instability. Pt will benefit from skilled PT services to increase LE muscle strength to improve gait/ pain-free functional mobility, along with motor planning.    OBJECTIVE IMPAIRMENTS Abnormal gait, decreased activity tolerance, decreased balance, decreased endurance, decreased mobility, difficulty walking, decreased ROM, decreased strength, impaired flexibility, improper body mechanics, postural dysfunction, and pain.   ACTIVITY LIMITATIONS carrying, lifting, bending, standing, squatting, stairs, transfers, and locomotion level  PARTICIPATION LIMITATIONS: cleaning, community activity, occupation, and yard work  PERSONAL FACTORS  Fitness, Past/current experiences, and Profession are also affecting patient's functional outcome.   REHAB POTENTIAL: Good  CLINICAL DECISION MAKING: Evolving/moderate complexity  EVALUATION COMPLEXITY: Moderate   GOALS: Goals reviewed with patient? Yes  SHORT TERM GOALS: Target date: 10/11/21  Pt. Independent with HEP to increase B LE strength 1/2 muscle grade to improve standing/ independence with gait.   Baseline:  see above Goal status: INITIAL   LONG TERM GOALS: Target date: 11/08/21  Pt. Will increase FOTO to 58 to improve functional mobility.   Baseline: Initial FOTO 44 Goal status: INITIAL  2.  Pt. Will report no low back pain consistently for 1 week with all work-related/ household tasks.   Baseline: increase back pain with increase activity.   Goal status: INITIAL  3.  Pt. Will ambulate with more normalized gait pattern with consistent hip flexion/ step length with least assistive device safely.   Baseline:  Goal status: INITIAL   PLAN: PT FREQUENCY: 2x/week  PT DURATION: 8 weeks  PLANNED INTERVENTIONS: Therapeutic exercises, Therapeutic activity, Neuromuscular re-education, Balance training, Gait training, Patient/Family education, Joint mobilization, Stair training, Spinal mobilization, Cryotherapy, Moist heat, and Manual therapy.  PLAN FOR NEXT SESSION: Progress HEP, gait and stair training. Motor planning.  10th visit progress note next tx. session    Cammie Mcgee, PT, DPT #  [id number], SPT 10/11/2021, 1:30 PM

## 2021-10-16 ENCOUNTER — Ambulatory Visit: Payer: Medicare Other

## 2021-10-16 DIAGNOSIS — G8929 Other chronic pain: Secondary | ICD-10-CM

## 2021-10-16 DIAGNOSIS — M2560 Stiffness of unspecified joint, not elsewhere classified: Secondary | ICD-10-CM | POA: Diagnosis not present

## 2021-10-16 DIAGNOSIS — M545 Low back pain, unspecified: Secondary | ICD-10-CM | POA: Diagnosis not present

## 2021-10-16 DIAGNOSIS — M6281 Muscle weakness (generalized): Secondary | ICD-10-CM | POA: Diagnosis not present

## 2021-10-16 NOTE — Therapy (Addendum)
OUTPATIENT PHYSICAL THERAPY THORACOLUMBAR TREATMENT/PHYSICAL THERAPY PROGRESS NOTE  Dates of reporting period 09/13/21 to 10/16/21    Patient Name: Larry Blair MRN: [id number] DOB:1955/12/15, 66 y.o., male Today's Date: 10/16/2021   PT End of Session - 10/16/21 1242     Visit Number 10    Number of Visits 17    Date for PT Re-Evaluation 11/08/21    PT Start Time 1032    PT Stop Time 1117    PT Time Calculation (min) 45 min    Activity Tolerance Patient tolerated treatment well    Behavior During Therapy [facility] for tasks assessed/performed              Past Medical History:  Diagnosis Date   Allergy    Arthritis    right shoulder   Asthma    Eczema    HTN (hypertension)    Hyperlipidemia    Substance abuse (HCC) 1976   daily   Past Surgical History:  Procedure Laterality Date   ANTERIOR CERVICAL DECOMP/DISCECTOMY FUSION N/A 08/30/2019   Procedure: CERVICAL THREE-FOUR ANTERIOR CERVICAL DECOMPRESSION/FUSION;  Surgeon: Tia Alert, MD;  Location: [facility] OR;  Service: Neurosurgery;  Laterality: N/A;   COLONOSCOPY     KNEE ARTHROSCOPY Left    Patient Active Problem List   Diagnosis Date Noted   COPD with asthma (HCC) 08/15/2021   Chronic bilateral low back pain without sciatica 08/15/2021   Need for prophylactic vaccination and inoculation against varicella 08/15/2021   Benign prostatic hyperplasia without lower urinary tract symptoms 10/03/2020   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   Diuretic-induced hypokalemia 11/24/2017   Chronic eczema of hand 08/11/2017   Hyperlipidemia LDL goal <70 07/01/2017   Routine general medical examination at a health care facility 06/30/2017   Essential hypertension 06/30/2017   Vitamin D deficiency 06/30/2017    PCP: Etta Grandchild, MD   REFERRING PROVIDER: Etta Grandchild, MD   REFERRING DIAG: Chronic bilateral low back pain without sciatica  Rationale for Evaluation and Treatment Rehabilitation  THERAPY DIAG:  Chronic  bilateral low back pain without sciatica  Joint stiffness  Muscle weakness (generalized)  ONSET DATE: 05/10/2019 (approximate date)- ACDF 08/30/19.    SUBJECTIVE:                                                                                                                                                                                           SUBJECTIVE STATEMENT: Pt reports 0/10 back pain today, and has been compliant with HEP. Pt's R knee was giving him achy pain this morning, which does not happen often. 2/10 pain in R knee, and 0/10  pain in L knee. Pt's shoulder has been feeling better, and has not taken tylenol since last week. Pt's shoulder pain is transient, and switches between his L deltoid and L scapular musculature, depending on the activities that he does throughout the day.   PERTINENT HISTORY:  ACDF, hx of knee scope on L knee.  Still utilizes knee brace. Hx of chronic ankle rolling bilaterally.   PAIN:  Are you having pain? Yes: NPRS scale: 0/10 Pain location: Low back Pain description: dull achy Aggravating factors: Prolonged standing at sink and carrying laundry Relieving factors: sitting, tylenol 650 mg 2 in morning   PRECAUTIONS: None  WEIGHT BEARING RESTRICTIONS No  FALLS:  Has patient fallen in last 6 months? 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    OBJECTIVE:   PATIENT SURVEYS:  FOTO initial 44/ goal 3 10/16/21: 53   SCREENING FOR RED FLAGS: Bowel or bladder incontinence: Yes: Hard to hold  urine Spinal tumors: No Cauda equina syndrome: No Compression fracture: No Abdominal aneurysm: No  COGNITION:  Overall cognitive status: Within functional limits for tasks assessed     SENSATION: Light touch: Impaired  on L LE  POSTURE: rounded shoulders  LOWER/UPPER EXTREMITY ROM:     Limited L/R SLR in supine position secondary to muscle weakness.  B shoulder and knee/LE AROM WFL  LOWER/UPPER EXTREMITY MMT:       Difficulty lifting up R hip (flexion).    MMT Right eval Left eval  Hip flexion 3/5 3/5  Hip extension 4/5 4/5  Hip abduction 4+/5 4+/5  Hip adduction 4+/5 4+/5  Hip internal rotation 4/5 5/5  Hip external rotation 4/5 5/5  Knee flexion 4/5 4/5  Knee extension 4-/5 4-/5  Ankle dorsiflexion 4/5 4/5  Ankle plantarflexion 4/5 4/5  Ankle inversion    Ankle eversion     (Blank rows = not tested)    B shoulder strength grossly: flexion 4/5 MMT, abduction 3/5, extension 4/5, bicep 5/5, tricep 5/5, sh. IR 4/5, ER 5/5 MMT.  Grip L 72#/ R 70#.       GAIT: Distance walked: in clinic (decrease hip flexion/ step length).   Assistive device utilized: Single point cane Level of assistance: Modified independence   TX:  10/16/21:  NuStep level 6 - 10 mins UE/LE  There ex: Walking over 6" hurdles and 12" hurdles within // bars step through pattern - alternating height per hurdle - 24 hurdles total   Stepping forward onto 8" surface (6" step + 2" airex pad) forward 10 x / lateral 10 x per LE  Standing squats 10 x - 2 sets  Walking in // bars with 2 black TB resistance in // bars - 5 x down and back forward / laterally / backwards   Standing on blue airex pad - two leg for 30 seconds / single leg for 30 s per LE  Weight shifting on blue airex pad - laterally 30 x/LE     PATIENT EDUCATION:  Education details: Access Code: [facility]  Person educated: Patient Education method: Explanation, Demonstration, and Handouts Education comprehension: verbalized  understanding and returned demonstration   HOME EXERCISE PROGRAM: Access Code: [facility] URL: [email or web address] Date: 09/13/2021 Prepared by: Dorene Grebe  Exercises - Standing March with Unilateral Counter Support  - 1 x daily - 7 x weekly - 1 sets - 20 reps - Sit to Stand Without Arm Support  - 1 x daily - 7 x weekly - 1 sets - 20 reps - Supine Active Straight Leg Raise  - 1 x daily - 7 x weekly - 1 sets - 20 reps - Supine March  - 1 x daily - 7 x weekly - 1 sets - 20 reps - Seated Long Arc Quad  - 1 x daily - 7 x weekly - 1 sets - 20 reps  ASSESSMENT:  CLINICAL IMPRESSION: Per 10th visit, progress note performed. Pt appears to be making steady progress towards all goals. Pt responded well to verbal cueing, and ambulated with a reciprocal gait pattern without his SPC throughout the clinic. Pt was challenged by resistance walking in // bars, and will benefit from further focusing on challenging his center of mass throughout PT intervention. Pt exhibited increased MMT strength of hip flexion from 3/5 to 3+/5, and will continue to emphasize LE strengthening and motor control in future tx sessions. Pt will benefit from skilled PT services to increase LE muscle strength to improve gait/ pain-free functional mobility, along with motor planning.    OBJECTIVE IMPAIRMENTS Abnormal gait, decreased activity tolerance, decreased balance, decreased endurance, decreased mobility, difficulty walking, decreased ROM, decreased strength, impaired flexibility, improper body mechanics, postural dysfunction, and pain.   ACTIVITY LIMITATIONS carrying, lifting, bending, standing, squatting, stairs, transfers, and locomotion level  PARTICIPATION LIMITATIONS: cleaning, community activity, occupation, and yard work  PERSONAL FACTORS Fitness, Past/current experiences, and Profession are also affecting patient's functional outcome.   REHAB POTENTIAL: Good  CLINICAL DECISION MAKING:  Evolving/moderate complexity  EVALUATION COMPLEXITY: Moderate   GOALS: Goals reviewed with patient? Yes  SHORT TERM GOALS: Target date: 10/11/21  Pt. Independent with HEP to increase B LE strength 1/2 muscle grade to improve standing/ independence with gait.   Baseline:  see above Goal status: IN PROGRESS - MMT - 3+/5 in hip flexion on 10/16/21   LONG TERM GOALS: Target date: 11/08/21  Pt. Will increase FOTO to 58 to improve functional mobility.   Baseline: Initial FOTO 44 ; 53 on 10/16/21 Goal status: IN PROGRESS  2.  Pt. Will report no low back pain consistently for 1 week with all work-related/ household tasks.   Baseline: increase back pain with increase activity.  10/16/21: Pt only experiences pain with heavy lifting and prolonged standing  Goal status: IN PROGRESS  3.  Pt. Will ambulate with more normalized gait pattern with consistent hip flexion/ step length with least assistive device safely.   Baseline: R foot lag - decreased step length  Goal status: IN PROGRESS   PLAN: PT FREQUENCY: 2x/week  PT DURATION: 8 weeks  PLANNED INTERVENTIONS: Therapeutic exercises, Therapeutic activity, Neuromuscular re-education, Balance training, Gait training, Patient/Family education, Joint mobilization, Stair training, Spinal mobilization, Cryotherapy, Moist heat, and Manual therapy.  PLAN FOR NEXT SESSION: Progress HEP, gait and stair training. Motor planning.  10th visit progress note next tx. session    Delphia Grates. Fairly IV, PT, DPT Physical Therapist- [facility]  [facility]    [place], [place] 10/16/2021, 2:42 PM

## 2021-10-18 ENCOUNTER — Ambulatory Visit: Payer: Medicare Other

## 2021-10-18 DIAGNOSIS — G8929 Other chronic pain: Secondary | ICD-10-CM

## 2021-10-18 DIAGNOSIS — M2560 Stiffness of unspecified joint, not elsewhere classified: Secondary | ICD-10-CM

## 2021-10-18 DIAGNOSIS — M6281 Muscle weakness (generalized): Secondary | ICD-10-CM | POA: Diagnosis not present

## 2021-10-18 DIAGNOSIS — M545 Low back pain, unspecified: Secondary | ICD-10-CM | POA: Diagnosis not present

## 2021-10-18 NOTE — Therapy (Addendum)
OUTPATIENT PHYSICAL THERAPY THORACOLUMBAR TREATMENT     Patient Name: Larry Blair MRN: [id number] DOB:[birth year], 66 y.o., male Today's Date: 10/18/2021   PT End of Session - 10/18/21 1039     Visit Number 11    Number of Visits 17    Date for PT Re-Evaluation 11/08/21    PT Start Time 1031    PT Stop Time 1118    PT Time Calculation (min) 47 min    Activity Tolerance Patient tolerated treatment well    Behavior During Therapy [facility] for tasks assessed/performed              Past Medical History:  Diagnosis Date   Allergy    Arthritis    right shoulder   Asthma    Eczema    HTN (hypertension)    Hyperlipidemia    Substance abuse (HCC) 1976   daily   Past Surgical History:  Procedure Laterality Date   ANTERIOR CERVICAL DECOMP/DISCECTOMY FUSION N/A 08/30/2019   Procedure: CERVICAL THREE-FOUR ANTERIOR CERVICAL DECOMPRESSION/FUSION;  Surgeon: Tia Alert, MD;  Location: [facility] OR;  Service: Neurosurgery;  Laterality: N/A;   COLONOSCOPY     KNEE ARTHROSCOPY Left    Patient Active Problem List   Diagnosis Date Noted   COPD with asthma (HCC) 08/15/2021   Chronic bilateral low back pain without sciatica 08/15/2021   Need for prophylactic vaccination and inoculation against varicella 08/15/2021   Benign prostatic hyperplasia without lower urinary tract symptoms 10/03/2020   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   Diuretic-induced hypokalemia 11/24/2017   Chronic eczema of hand 08/11/2017   Hyperlipidemia LDL goal <70 07/01/2017   Routine general medical examination at a health care facility 06/30/2017   Essential hypertension 06/30/2017   Vitamin D deficiency 06/30/2017    PCP: Etta Grandchild, MD   REFERRING PROVIDER: Etta Grandchild, MD   REFERRING DIAG: Chronic bilateral low back pain without sciatica  Rationale for Evaluation and Treatment Rehabilitation  THERAPY DIAG:  Chronic bilateral low back pain without sciatica  Joint stiffness  Muscle weakness  (generalized)  ONSET DATE: 05/10/2019 (approximate date)- ACDF 08/30/19.    SUBJECTIVE:                                                                                                                                                                                           SUBJECTIVE STATEMENT: Pt reports 0/10 back pain today, and has been compliant with HEP. Pt has not taken tylenol in over a week, and has been utilizing a heat pad on his shoulder if it gets sore from activities throughout the day. Pt was eager to  increase his Nustep level today, and to test his LE strength and endurance during sit-to-stands in clinic. Pt was able to step in and out of bathtub this morning without any pain or LOB.   PERTINENT HISTORY:  ACDF, hx of knee scope on L knee.  Still utilizes knee brace. Hx of chronic ankle rolling bilaterally.   PAIN:  Are you having pain? Yes: NPRS scale: 0/10 Pain location: Low back Pain description: dull achy Aggravating factors: Prolonged standing at sink and carrying laundry Relieving factors: sitting, tylenol 650 mg 2 in morning   PRECAUTIONS: None  WEIGHT BEARING RESTRICTIONS No  FALLS:  Has patient fallen in last 6 months? 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    OBJECTIVE:   PATIENT SURVEYS:  FOTO initial 44/ goal 75 10/16/21: 53   SCREENING FOR RED FLAGS: Bowel or bladder incontinence: Yes: Hard to hold urine Spinal tumors: No Cauda equina syndrome: No Compression fracture: No Abdominal  aneurysm: No  COGNITION:  Overall cognitive status: Within functional limits for tasks assessed     SENSATION: Light touch: Impaired  on L LE  POSTURE: rounded shoulders  LOWER/UPPER EXTREMITY ROM:     Limited L/R SLR in supine position secondary to muscle weakness.  B shoulder and knee/LE AROM WFL  LOWER/UPPER EXTREMITY MMT:       Difficulty lifting up R hip (flexion).    MMT Right eval Left eval  Hip flexion 3/5 3/5  Hip extension 4/5 4/5  Hip abduction 4+/5 4+/5  Hip adduction 4+/5 4+/5  Hip internal rotation 4/5 5/5  Hip external rotation 4/5 5/5  Knee flexion 4/5 4/5  Knee extension 4-/5 4-/5  Ankle dorsiflexion 4/5 4/5  Ankle plantarflexion 4/5 4/5  Ankle inversion    Ankle eversion     (Blank rows = not tested)    B shoulder strength grossly: flexion 4/5 MMT, abduction 3/5, extension 4/5, bicep 5/5, tricep 5/5, sh. IR 4/5, ER 5/5 MMT.  Grip L 72#/ R 70#.       GAIT: Distance walked: in clinic (decrease hip flexion/ step length).   Assistive device utilized: Single point cane Level of assistance: Modified independence   TX:  10/17/21:  NuStep level 6 - 10 mins UE/LE  There ex:  12" forward step ups 24 x per LE at staircase - two hand assist   12" lateral step ups 10 x per LE at staircase - single hand assist  Nautilus walking with 60# - 5x backward and 5x forward - cueing on heel strike and hip flexion   Side stepping at agility ladder - down and back x 6  10 x sit- to - stand at blue mat table  Seated marching with 5# ankle weights - 15 x per LE   Seated LAQ with 5# ankle weight - 10 x per LE  Seated anterior heel taps with 5# ankle weight - 15 x per LE   Ambulating throughout clinic with cueing on knee flexion and heel-toe gait pattern without SPC -  60 seconds   Uncharged Hypervolt to B traps and L deltoid in seated position - level 1 for 7 minutes    PATIENT EDUCATION:  Education details: Access Code: [email or web address] Person educated:  Patient Education method: Explanation, Demonstration, and Handouts Education comprehension: verbalized understanding and returned demonstration   HOME EXERCISE PROGRAM: Access Code: [facility] URL: [email or web address] Date: 09/13/2021 Prepared by: Dorene Grebe  Exercises - Standing March with Unilateral Counter Support  - 1 x daily - 7 x weekly - 1 sets - 20 reps - Sit to Stand Without Arm Support  - 1 x daily - 7 x weekly - 1 sets - 20 reps - Supine Active Straight Leg Raise  - 1 x daily - 7 x weekly - 1 sets - 20 reps - Supine March  - 1 x daily - 7 x weekly - 1 sets - 20 reps - Seated Long Arc Quad  - 1 x daily - 7 x weekly - 1 sets - 20 reps  ASSESSMENT:  CLINICAL IMPRESSION: Pt performed 12" step ups anteriorly and laterally without any LOB, and demonstrated proper form throughout all interventions on the 12" step. Pt required verbal cueing throughout tx to focus on the heel strike of R LE during ambulation, and cueing to increase his stride length at the nautilus. Pt required one water break after his 10 mins at level 6 on the NuStep. Pt will benefit from further skilled physical therapy with emphasis on motor control of his LE's, as well as incorporating a more reciprocal arm swing throughout ambulation without his SPC into future tx sessions.  OBJECTIVE IMPAIRMENTS Abnormal gait, decreased activity tolerance, decreased balance, decreased endurance, decreased mobility, difficulty walking, decreased ROM, decreased strength, impaired flexibility, improper body mechanics, postural dysfunction, and pain.   ACTIVITY LIMITATIONS carrying, lifting, bending, standing, squatting, stairs, transfers, and locomotion level  PARTICIPATION LIMITATIONS: cleaning, community activity, occupation, and yard work  PERSONAL FACTORS Fitness, Past/current experiences, and Profession are also affecting patient's functional outcome.   REHAB POTENTIAL: Good  CLINICAL DECISION MAKING:  Evolving/moderate complexity  EVALUATION COMPLEXITY: Moderate   GOALS: Goals reviewed with patient? Yes  SHORT TERM GOALS: Target date: 10/11/21  Pt. Independent with HEP to increase B LE strength 1/2 muscle grade to improve standing/ independence with gait.   Baseline:  see above Goal status: IN PROGRESS - MMT - 3+/5 in hip flexion on 10/16/21   LONG TERM GOALS: Target date: 11/08/21  Pt. Will increase FOTO to 58 to improve functional mobility.   Baseline: Initial FOTO 44 ; 53 on 10/16/21 Goal status: IN PROGRESS  2.  Pt. Will report no low back pain consistently for 1 week with all work-related/ household tasks.   Baseline: increase back pain with increase activity.  10/16/21: Pt only experiences pain with heavy lifting and prolonged standing  Goal status: IN PROGRESS  3.  Pt. Will ambulate with more normalized gait pattern with consistent hip flexion/ step length with least assistive device safely.   Baseline: R foot lag - decreased step length  Goal status: IN PROGRESS   PLAN: PT FREQUENCY: 2x/week  PT DURATION: 8 weeks  PLANNED INTERVENTIONS: Therapeutic exercises, Therapeutic activity, Neuromuscular re-education, Balance training, Gait training, Patient/Family education, Joint mobilization, Stair training, Spinal mobilization, Cryotherapy, Moist heat, and Manual therapy.  PLAN FOR NEXT SESSION: Progress HEP, gait and stair training. Motor planning.     Delphia Grates. Fairly IV, PT, DPT Physical Therapist- [facility]  [facility]   [place], [place] 10/18/2021,  12:50 PM

## 2021-10-23 ENCOUNTER — Encounter: Payer: Self-pay | Admitting: Physical Therapy

## 2021-10-23 ENCOUNTER — Ambulatory Visit: Payer: Medicare Other

## 2021-10-23 DIAGNOSIS — M6281 Muscle weakness (generalized): Secondary | ICD-10-CM

## 2021-10-23 DIAGNOSIS — M545 Low back pain, unspecified: Secondary | ICD-10-CM

## 2021-10-23 DIAGNOSIS — G8929 Other chronic pain: Secondary | ICD-10-CM | POA: Diagnosis not present

## 2021-10-23 DIAGNOSIS — M2560 Stiffness of unspecified joint, not elsewhere classified: Secondary | ICD-10-CM | POA: Diagnosis not present

## 2021-10-23 NOTE — Therapy (Signed)
OUTPATIENT PHYSICAL THERAPY THORACOLUMBAR TREATMENT     Patient Name: Larry Blair MRN: [id number] DOB:Jun 24, 1955, 66 y.o., male Today's Date: 10/18/2021   PT End of Session - 10/23/21 1554     Visit Number 12    Number of Visits 17    Date for PT Re-Evaluation 11/08/21    PT Start Time 1601    PT Stop Time 1645    PT Time Calculation (min) 44 min    Activity Tolerance Patient tolerated treatment well    Behavior During Therapy [facility] for tasks assessed/performed              Past Medical History:  Diagnosis Date   Allergy    Arthritis    right shoulder   Asthma    Eczema    HTN (hypertension)    Hyperlipidemia    Substance abuse (HCC) 1976   daily   Past Surgical History:  Procedure Laterality Date   ANTERIOR CERVICAL DECOMP/DISCECTOMY FUSION N/A 08/30/2019   Procedure: CERVICAL THREE-FOUR ANTERIOR CERVICAL DECOMPRESSION/FUSION;  Surgeon: Tia Alert, MD;  Location: [facility] OR;  Service: Neurosurgery;  Laterality: N/A;   COLONOSCOPY     KNEE ARTHROSCOPY Left    Patient Active Problem List   Diagnosis Date Noted   COPD with asthma (HCC) 08/15/2021   Chronic bilateral low back pain without sciatica 08/15/2021   Need for prophylactic vaccination and inoculation against varicella 08/15/2021   Benign prostatic hyperplasia without lower urinary tract symptoms 10/03/2020   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   Diuretic-induced hypokalemia 11/24/2017   Chronic eczema of hand 08/11/2017   Hyperlipidemia LDL goal <70 07/01/2017   Routine general medical examination at a health care facility 06/30/2017   Essential hypertension 06/30/2017   Vitamin D deficiency 06/30/2017    PCP: Etta Grandchild, MD   REFERRING PROVIDER: Etta Grandchild, MD   REFERRING DIAG: Chronic bilateral low back pain without sciatica  Rationale for Evaluation and Treatment Rehabilitation  THERAPY DIAG:  Chronic bilateral low back pain without sciatica  Joint stiffness  Muscle weakness  (generalized)  ONSET DATE: 05/10/2019 (approximate date)- ACDF 08/30/19.    SUBJECTIVE:                                                                                                                                                                                           SUBJECTIVE STATEMENT: Pt reports having a controlled fall. Denies any injury. No pain currently.   PERTINENT HISTORY:  ACDF, hx of knee scope on L knee.  Still utilizes knee brace. Hx of chronic ankle rolling bilaterally.   PAIN:  Are you having pain?  Yes: NPRS scale: 0/10 Pain location: Low back Pain description: dull achy Aggravating factors: Prolonged standing at sink and carrying laundry Relieving factors: sitting, tylenol 650 mg 2 in morning   PRECAUTIONS: None  WEIGHT BEARING RESTRICTIONS No  FALLS:  Has patient fallen in last 6 months? 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    OBJECTIVE:   PATIENT SURVEYS:  FOTO initial 44/ goal 87 10/16/21: 53   SCREENING FOR RED FLAGS: Bowel or bladder incontinence: Yes: Hard to hold urine Spinal tumors: No Cauda equina syndrome: No Compression fracture: No Abdominal aneurysm: No  COGNITION:  Overall cognitive status: Within functional limits for tasks assessed     SENSATION: Light touch: Impaired  on L LE  POSTURE: rounded shoulders  LOWER/UPPER EXTREMITY ROM:     Limited L/R SLR in supine position secondary to muscle weakness.  B shoulder and knee/LE AROM WFL  LOWER/UPPER EXTREMITY MMT:        Difficulty lifting up R hip (flexion).    MMT Right eval Left eval  Hip flexion 3/5 3/5  Hip extension 4/5 4/5  Hip abduction 4+/5 4+/5  Hip adduction 4+/5 4+/5  Hip internal rotation 4/5 5/5  Hip external rotation 4/5 5/5  Knee flexion 4/5 4/5  Knee extension 4-/5 4-/5  Ankle dorsiflexion 4/5 4/5  Ankle plantarflexion 4/5 4/5  Ankle inversion    Ankle eversion     (Blank rows = not tested)    B shoulder strength grossly: flexion 4/5 MMT, abduction 3/5, extension 4/5, bicep 5/5, tricep 5/5, sh. IR 4/5, ER 5/5 MMT.  Grip L 72#/ R 70#.       GAIT: Distance walked: in clinic (decrease hip flexion/ step length).   Assistive device utilized: Single point cane Level of assistance: Modified independence   TX:  10/23/21:  There.ex: Nu Step L6 for 10 minutes utilizing UE and LE's for warm up and LE strengthening maintaining > 90 SPM. 3# AW side steps over hurdle: x10/LE with light UE touching on // bar. SBA.    Pt's HEP updated as below. Performed in clinic as indicated with reps and sets as prescribed below. Education provided on how to perform safely at home with use of countertop and use of at home ankle weights. Use of 3# AW's for exercises requiring resistance.    Access Code: [facility] URL: [email or web address] Date: 10/23/2021 Prepared by: Ronnie Derby  Exercises - Mini Squat with Counter Support  - 1 x daily - 7 x weekly - 3 sets - 10 reps - Lunge with Counter Support  - 1 x daily - 7 x weekly - 3 sets - 6 reps - Standing Single Leg Stance with Counter Support  - 1 x daily - 7 x weekly - 2 sets - 3 reps - 30 hold - Seated Hip Flexion March with Ankle Weights  - 1 x daily - 7 x weekly - 3 sets - 10 reps - Seated Long Arc Quad with Ankle Weight  - 1 x daily - 7  x weekly - 3 sets - 10 reps - Side Stepping with Resistance at Ankles and Counter Support  - 1 x daily - 7 x weekly - 3 sets - 10 reps   Manual Therapy: Pt in supine for 10  minutes  Hamstring stretch: 2x30 sec/LE   SKTC: 2x30 sec/LE   Hip flexion + ER/IR: 2x30 sec/LE  Ankle DF PROM: x10/LE     PATIENT EDUCATION:  Education details: Updated HEP. Access Code: [email or web address] Person educated: Patient Education method: Explanation, Demonstration, and Handouts Education comprehension: verbalized understanding and returned demonstration   HOME EXERCISE PROGRAM: Access Code: [facility] URL: [email or web address] Date: 09/13/2021 Prepared by: Dorene Grebe  Exercises - Standing March with Unilateral Counter Support  - 1 x daily - 7 x weekly - 1 sets - 20 reps - Sit to Stand Without Arm Support  - 1 x daily - 7 x weekly - 1 sets - 20 reps - Supine Active Straight Leg Raise  - 1 x daily - 7 x weekly - 1 sets - 20 reps - Supine March  - 1 x daily - 7 x weekly - 1 sets - 20 reps - Seated Long Arc Quad  - 1 x daily - 7 x weekly - 1 sets - 20 reps  ASSESSMENT:  CLINICAL IMPRESSION: Updating HEP as pt had fall and reports of HEP being too easy. Majority of session with focus on education and performance of updated HEP. Demonstrating good and safe completion of HEP with intermittent use of SUE support on surface for steadying. Manual intervention needed today due to low back stiffness with subjective reports of improvement post manual intervention. Pt will continue to benefit from skilled PT services to progress strength and balance to reduce risk of future falls    OBJECTIVE IMPAIRMENTS Abnormal gait, decreased activity tolerance, decreased balance, decreased endurance, decreased mobility, difficulty walking, decreased ROM, decreased strength, impaired flexibility, improper body mechanics, postural dysfunction, and pain.   ACTIVITY LIMITATIONS carrying, lifting, bending, standing, squatting, stairs, transfers, and locomotion level  PARTICIPATION LIMITATIONS: cleaning, community activity, occupation, and yard work  PERSONAL FACTORS Fitness, Past/current  experiences, and Profession are also affecting patient's functional outcome.   REHAB POTENTIAL: Good  CLINICAL DECISION MAKING: Evolving/moderate complexity  EVALUATION COMPLEXITY: Moderate   GOALS: Goals reviewed with patient? Yes  SHORT TERM GOALS: Target date: 10/11/21  Pt. Independent with HEP to increase B LE strength 1/2 muscle grade to improve standing/ independence with gait.   Baseline:  see above Goal status: IN PROGRESS - MMT - 3+/5 in hip flexion on 10/16/21   LONG TERM GOALS: Target date: 11/08/21  Pt. Will increase FOTO to 58 to improve functional mobility.   Baseline: Initial FOTO 44 ; 53 on 10/16/21 Goal status: IN PROGRESS  2.  Pt. Will report no low back pain consistently for 1 week with all work-related/ household tasks.   Baseline: increase back pain with increase activity.  10/16/21: Pt only experiences pain with heavy lifting and prolonged standing  Goal status: IN PROGRESS  3.  Pt. Will ambulate with more normalized gait pattern with consistent hip flexion/ step length with least assistive device safely.   Baseline: R foot lag - decreased step length  Goal status: IN PROGRESS   PLAN: PT FREQUENCY: 2x/week  PT DURATION: 8 weeks  PLANNED INTERVENTIONS: Therapeutic exercises, Therapeutic activity, Neuromuscular re-education, Balance training, Gait training, Patient/Family education, Joint mobilization, Stair training, Spinal mobilization, Cryotherapy, Moist heat, and Manual therapy.  PLAN FOR NEXT SESSION: Progress HEP,  gait and stair training. Motor planning. Standing up from tall kneeling/lunge positions for work related tasks.      This entire session was performed under direct supervision and direction of a licensed therapist. I have personally read, edited and approve of the note as written.  Thornell Sartorius, SPT   Delphia Grates. Fairly IV, PT, DPT Physical Therapist- [place]  [facility]  10/23/2021, 4:43 PM

## 2021-10-25 ENCOUNTER — Encounter: Payer: Self-pay | Admitting: Physical Therapy

## 2021-10-25 ENCOUNTER — Ambulatory Visit: Payer: Medicare Other

## 2021-10-25 DIAGNOSIS — M545 Low back pain, unspecified: Secondary | ICD-10-CM

## 2021-10-25 DIAGNOSIS — M6281 Muscle weakness (generalized): Secondary | ICD-10-CM | POA: Diagnosis not present

## 2021-10-25 DIAGNOSIS — G8929 Other chronic pain: Secondary | ICD-10-CM | POA: Diagnosis not present

## 2021-10-25 DIAGNOSIS — M2560 Stiffness of unspecified joint, not elsewhere classified: Secondary | ICD-10-CM

## 2021-10-25 NOTE — Therapy (Signed)
OUTPATIENT PHYSICAL THERAPY THORACOLUMBAR TREATMENT     Patient Name: Larry Blair MRN: [id number] DOB:20-May-1955, 66 y.o., male Today's Date: 10/18/2021   PT End of Session - 10/25/21 1605     Visit Number 13    Number of Visits 17    Date for PT Re-Evaluation 11/08/21    PT Start Time 1601    PT Stop Time 1645    PT Time Calculation (min) 44 min    Activity Tolerance Patient tolerated treatment well    Behavior During Therapy [facility] for tasks assessed/performed              Past Medical History:  Diagnosis Date   Allergy    Arthritis    right shoulder   Asthma    Eczema    HTN (hypertension)    Hyperlipidemia    Substance abuse (HCC) 1976   daily   Past Surgical History:  Procedure Laterality Date   ANTERIOR CERVICAL DECOMP/DISCECTOMY FUSION N/A 08/30/2019   Procedure: CERVICAL THREE-FOUR ANTERIOR CERVICAL DECOMPRESSION/FUSION;  Surgeon: Tia Alert, MD;  Location: [facility] OR;  Service: Neurosurgery;  Laterality: N/A;   COLONOSCOPY     KNEE ARTHROSCOPY Left    Patient Active Problem List   Diagnosis Date Noted   COPD with asthma (HCC) 08/15/2021   Chronic bilateral low back pain without sciatica 08/15/2021   Need for prophylactic vaccination and inoculation against varicella 08/15/2021   Benign prostatic hyperplasia without lower urinary tract symptoms 10/03/2020   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   Diuretic-induced hypokalemia 11/24/2017   Chronic eczema of hand 08/11/2017   Hyperlipidemia LDL goal <70 07/01/2017   Routine general medical examination at a health care facility 06/30/2017   Essential hypertension 06/30/2017   Vitamin D deficiency 06/30/2017    PCP: Etta Grandchild, MD   REFERRING PROVIDER: Etta Grandchild, MD   REFERRING DIAG: Chronic bilateral low back pain without sciatica  Rationale for Evaluation and Treatment Rehabilitation  THERAPY DIAG:  Chronic bilateral low back pain without sciatica  Joint stiffness  Muscle weakness  (generalized)  ONSET DATE: 05/10/2019 (approximate date)- ACDF 08/30/19.    SUBJECTIVE:                                                                                                                                                                                           SUBJECTIVE STATEMENT: Pt reports some mild posterior LE pain in RLE performing lunge with LLE forward up to 5/10 NPS lasting ~10-15 minutes touching lateral hamstring and hamstring tendon at insertion. Has been performing weed eating tasks and lawn mowing without issue.  PERTINENT HISTORY:  ACDF, hx of knee scope on L knee.  Still utilizes knee brace. Hx of chronic ankle rolling bilaterally.   PAIN:  Are you having pain? Yes: NPRS scale: 0/10 Pain location: Low back Pain description: dull achy Aggravating factors: Prolonged standing at sink and carrying laundry Relieving factors: sitting, tylenol 650 mg 2 in morning   PRECAUTIONS: None  WEIGHT BEARING RESTRICTIONS No  FALLS:  Has patient fallen in last 6 months? 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    OBJECTIVE:   PATIENT SURVEYS:  FOTO initial 44/ goal 57 10/16/21: 53   SCREENING FOR RED FLAGS: Bowel or bladder incontinence: Yes: Hard to hold urine Spinal tumors: No Cauda equina syndrome: No Compression fracture: No Abdominal aneurysm: No  COGNITION:  Overall cognitive status: Within functional limits for tasks assessed     SENSATION: Light touch: Impaired  on L LE  POSTURE: rounded  shoulders  LOWER/UPPER EXTREMITY ROM:     Limited L/R SLR in supine position secondary to muscle weakness.  B shoulder and knee/LE AROM WFL  LOWER/UPPER EXTREMITY MMT:       Difficulty lifting up R hip (flexion).    MMT Right eval Left eval  Hip flexion 3/5 3/5  Hip extension 4/5 4/5  Hip abduction 4+/5 4+/5  Hip adduction 4+/5 4+/5  Hip internal rotation 4/5 5/5  Hip external rotation 4/5 5/5  Knee flexion 4/5 4/5  Knee extension 4-/5 4-/5  Ankle dorsiflexion 4/5 4/5  Ankle plantarflexion 4/5 4/5  Ankle inversion    Ankle eversion     (Blank rows = not tested)    B shoulder strength grossly: flexion 4/5 MMT, abduction 3/5, extension 4/5, bicep 5/5, tricep 5/5, sh. IR 4/5, ER 5/5 MMT.  Grip L 72#/ R 70#.       GAIT: Distance walked: in clinic (decrease hip flexion/ step length).   Assistive device utilized: Single point cane Level of assistance: Modified independence   TX:  10/25/21:  There.ex: Nu Step L5 for 10 minutes utilizing UE and LE's for warm up and LE strengthening maintaining > 90 SPM.  Supine hamstring stretch/hip ER/Hip IR/SKTC: 2x30sec/LE   Prone STM with hypervolt to R lateral hamstring for 5 minutes to reduce muscular tension in prep for exercise  LLE forward lunge in // bars with BUE support: x10. Good form/technique  Lateral lunge onto BOSU ball: x5/LE. Reports knee pain so discontinued  6" step up with contralateral hip flexion with BUE support: x20/LE  6" heel raises: 2x12 BLE  Alternating hurdle step overs (alternating small and large) hurdles with BUE support on // bars: x8 down and back. Difficulty with RLE for hip flexion on 12" hurdle. Minor vaulting need with lumbar extension to clear hurdle with RLE.        Access Code: [facility] URL: [email or web address] Date: 10/23/2021 Prepared by: Ronnie Derby  Exercises - Mini Squat with Counter Support  - 1 x daily - 7 x weekly - 3 sets - 10 reps - Lunge with Counter  Support  - 1 x daily - 7 x weekly - 3 sets - 6 reps - Standing Single Leg Stance with Counter Support  - 1 x daily - 7 x weekly - 2 sets - 3 reps - 30 hold - Seated Hip Flexion March with Ankle Weights  - 1 x daily - 7 x weekly - 3 sets - 10 reps - Seated Long Arc Quad with Ankle Weight  - 1 x daily - 7 x weekly - 3 sets - 10 reps - Side Stepping with Resistance at Ankles and Counter Support  - 1 x daily - 7 x weekly - 3 sets - 10 reps    PATIENT EDUCATION:  Education details: Updated HEP. Access Code: [email or web address] Person educated: Patient Education method: Explanation, Demonstration, and Handouts Education comprehension: verbalized understanding and returned demonstration   HOME EXERCISE PROGRAM: Access Code: [facility] URL: [email or web address] Date: 09/13/2021 Prepared by: Dorene Grebe  Exercises - Standing March with Unilateral Counter Support  - 1 x daily - 7 x weekly - 1 sets - 20 reps - Sit to Stand Without Arm Support  - 1 x daily - 7 x weekly - 1 sets - 20 reps - Supine Active Straight Leg Raise  - 1 x daily - 7 x weekly - 1 sets - 20 reps - Supine March  - 1 x daily - 7 x weekly - 1 sets - 20 reps - Seated Long Arc Quad  - 1 x daily - 7 x weekly - 1 sets - 20 reps  ASSESSMENT:  CLINICAL IMPRESSION: Reassessing lunges as pt reported pain in forward lunge with HEP at home. Adequate form noted. Encouraged to continue with HEP but if consistently having hamstring pain to discontinue. Tolerating progression of strengthening per POC well without pain. Pt will continue to benefit from skilled PT services to progress strength and balance to reduce risk of falls.     OBJECTIVE IMPAIRMENTS Abnormal gait, decreased activity tolerance, decreased balance, decreased endurance, decreased mobility, difficulty walking, decreased ROM, decreased strength, impaired flexibility, improper body mechanics, postural dysfunction, and pain.   ACTIVITY LIMITATIONS carrying, lifting,  bending, standing, squatting, stairs, transfers, and locomotion level  PARTICIPATION LIMITATIONS: cleaning, community activity, occupation, and yard work  PERSONAL FACTORS Fitness, Past/current experiences, and Profession are also affecting patient's functional outcome.   REHAB POTENTIAL: Good  CLINICAL DECISION MAKING: Evolving/moderate complexity  EVALUATION COMPLEXITY: Moderate   GOALS: Goals reviewed with patient? Yes  SHORT TERM GOALS: Target date: 10/11/21  Pt. Independent with HEP to increase B LE strength 1/2 muscle grade to improve standing/ independence with gait.   Baseline:  see above Goal status: IN PROGRESS - MMT - 3+/5 in hip flexion on 10/16/21   LONG TERM GOALS: Target date: 11/08/21  Pt. Will increase FOTO to 58 to improve functional mobility.   Baseline: Initial FOTO 44 ; 53 on 10/16/21 Goal status: IN PROGRESS  2.  Pt. Will report no low back pain consistently for 1 week with all work-related/ household tasks.   Baseline: increase back pain with increase activity.  10/16/21: Pt only experiences pain with heavy lifting and prolonged standing  Goal status: IN PROGRESS  3.  Pt. Will ambulate with more normalized gait pattern with consistent hip flexion/ step length with least assistive device safely.   Baseline: R foot lag - decreased step length  Goal status: IN PROGRESS   PLAN: PT FREQUENCY: 2x/week  PT DURATION: 8 weeks  PLANNED INTERVENTIONS: Therapeutic exercises, Therapeutic activity, Neuromuscular re-education, Balance  training, Gait training, Patient/Family education, Joint mobilization, Stair training, Spinal mobilization, Cryotherapy, Moist heat, and Manual therapy.  PLAN FOR NEXT SESSION: Progress HEP, gait and stair training. Motor planning. Standing up from tall kneeling/lunge positions for work related tasks.      This entire session was performed under direct supervision and direction of a licensed therapist. I have personally read, edited  and approve of the note as written.  Thornell Sartorius, SPT   Delphia Grates. Fairly IV, PT, DPT Physical Therapist- [place]  [facility]  10/25/2021, 4:51 PM

## 2021-10-29 ENCOUNTER — Ambulatory Visit: Payer: Medicare Other | Admitting: Physical Therapy

## 2021-10-31 ENCOUNTER — Ambulatory Visit: Payer: Medicare Other | Admitting: Physical Therapy

## 2021-11-05 ENCOUNTER — Ambulatory Visit: Payer: Medicare Other | Admitting: Physical Therapy

## 2021-11-05 ENCOUNTER — Encounter: Payer: Self-pay | Admitting: Physical Therapy

## 2021-11-05 DIAGNOSIS — M2560 Stiffness of unspecified joint, not elsewhere classified: Secondary | ICD-10-CM

## 2021-11-05 DIAGNOSIS — G8929 Other chronic pain: Secondary | ICD-10-CM | POA: Diagnosis not present

## 2021-11-05 DIAGNOSIS — M545 Low back pain, unspecified: Secondary | ICD-10-CM | POA: Diagnosis not present

## 2021-11-05 DIAGNOSIS — M6281 Muscle weakness (generalized): Secondary | ICD-10-CM

## 2021-11-05 NOTE — Therapy (Addendum)
OUTPATIENT PHYSICAL THERAPY THORACOLUMBAR TREATMENT     Patient Name: Larry Blair MRN: [id number] DOB:Sep 12, 1955, 66 y.o., male Today's Date: 11/05/2021   PT End of Session - 11/05/21 1345     Visit Number 14    Number of Visits 17    Date for PT Re-Evaluation 11/08/21    PT Start Time 1342    PT Stop Time 1426    PT Time Calculation (min) 44 min    Activity Tolerance Patient tolerated treatment well    Behavior During Therapy [facility] for tasks assessed/performed              Past Medical History:  Diagnosis Date   Allergy    Arthritis    right shoulder   Asthma    Eczema    HTN (hypertension)    Hyperlipidemia    Substance abuse ([place]) 1976   daily   Past Surgical History:  Procedure Laterality Date   ANTERIOR CERVICAL DECOMP/DISCECTOMY FUSION N/A 08/30/2019   Procedure: CERVICAL THREE-FOUR ANTERIOR CERVICAL DECOMPRESSION/FUSION;  Surgeon: Eustace Moore, MD;  Location: [facility];  Service: Neurosurgery;  Laterality: N/A;   COLONOSCOPY     KNEE ARTHROSCOPY Left    Patient Active Problem List   Diagnosis Date Noted   COPD with asthma ([place]) 08/15/2021   Chronic bilateral low back pain without sciatica 08/15/2021   Need for prophylactic vaccination and inoculation against varicella 08/15/2021   Benign prostatic hyperplasia without lower urinary tract symptoms 10/03/2020   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   Diuretic-induced hypokalemia 11/24/2017   Chronic eczema of hand 08/11/2017   Hyperlipidemia LDL goal <70 07/01/2017   Routine general medical examination at a health care facility 06/30/2017   Essential hypertension 06/30/2017   Vitamin D deficiency 06/30/2017    PCP: Janith Lima, MD   REFERRING PROVIDER: Janith Lima, MD   REFERRING DIAG: Chronic bilateral low back pain without sciatica  Rationale for Evaluation and Treatment Rehabilitation  THERAPY DIAG:  Chronic bilateral low back pain without sciatica  Joint stiffness  Muscle weakness  (generalized)  ONSET DATE: 05/10/2019 (approximate date)- ACDF 08/30/19.    SUBJECTIVE:                                                                                                                                                                                           SUBJECTIVE STATEMENT: Pt. Reports no pain or new issues.  Pt. Entered PT with use of SPC (carrying more than using).  Has been performing weed eating tasks and lawn mowing without issue (3 hours of riding mower and 1+ of weedeating).  Pt. Worked last  week which is why he missed PT last week.    PERTINENT HISTORY:  ACDF, hx of knee scope on L knee.  Still utilizes knee brace. Hx of chronic ankle rolling bilaterally.   PAIN:  Are you having pain? Yes: NPRS scale: 0/10 Pain location: Low back Pain description: dull achy Aggravating factors: Prolonged standing at sink and carrying laundry Relieving factors: sitting, tylenol 650 mg 2 in morning   PRECAUTIONS: None  WEIGHT BEARING RESTRICTIONS No  FALLS:  Has patient fallen in last 6 months? 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    OBJECTIVE:   PATIENT SURVEYS:  FOTO initial 44/ goal 92 10/16/21: 73   SCREENING FOR RED FLAGS: Bowel or bladder incontinence: Yes: Hard to hold urine Spinal tumors: No Cauda equina syndrome: No Compression fracture: No Abdominal aneurysm: No  COGNITION:  Overall cognitive status: Within functional limits for tasks assessed     SENSATION: Light touch: Impaired  on L  LE  POSTURE: rounded shoulders  LOWER/UPPER EXTREMITY ROM:     Limited L/R SLR in supine position secondary to muscle weakness.  B shoulder and knee/LE AROM WFL  LOWER/UPPER EXTREMITY MMT:       Difficulty lifting up R hip (flexion).    MMT Right eval Left eval  Hip flexion 3/5 3/5  Hip extension 4/5 4/5  Hip abduction 4+/5 4+/5  Hip adduction 4+/5 4+/5  Hip internal rotation 4/5 5/5  Hip external rotation 4/5 5/5  Knee flexion 4/5 4/5  Knee extension 4-/5 4-/5  Ankle dorsiflexion 4/5 4/5  Ankle plantarflexion 4/5 4/5  Ankle inversion    Ankle eversion     (Blank rows = not tested)    B shoulder strength grossly: flexion 4/5 MMT, abduction 3/5, extension 4/5, bicep 5/5, tricep 5/5, sh. IR 4/5, ER 5/5 MMT.  Grip L 72#/ R 70#.       GAIT: Distance walked: in clinic (decrease hip flexion/ step length).   Assistive device utilized: Single point cane Level of assistance: Modified independence   TX:  11/05/21:  There.ex:  Nu Step L5 for 10 minutes utilizing UE and LE's for warm up and LE strengthening maintaining > >100 SPM.  Floor to standing with lunges on L/R 5x each with light UE assist at stairs.  Use of blue mat and Airex pad  6" step up with contralateral hip flexion with light BUE support: x20/LE  Forward/ lateral step ups onto BOSU ball: x5 on L/R LE each.  Light UE/ SBA as needed.    Alternating hurdle step overs (6") hurdles with BUE support on // bars progressing to 1 UE assist in //-bars: x4 down and back.   Supine hamstring stretch/hip ER/Hip IR/SKTC: 2x30sec/LE   Supine bridging 10x (no pain reported).      Access Code: [facility] URL: [email or web address] Date: 10/23/2021 Prepared by: [place] with Counter Support  - 1 x daily - 7 x weekly - 3 sets - 10 reps - Lunge with Counter  Support  - 1 x daily - 7 x weekly - 3 sets - 6 reps - Standing Single Leg Stance with Counter Support  - 1 x daily - 7 x  weekly - 2 sets - 3 reps - 30 hold - Seated Hip Flexion March with Ankle Weights  - 1 x daily - 7 x weekly - 3 sets - 10 reps - Seated Long Arc Quad with Ankle Weight  - 1 x daily - 7 x weekly - 3 sets - 10 reps - Side Stepping with Resistance at Ankles and Counter Support  - 1 x daily - 7 x weekly - 3 sets - 10 reps    PATIENT EDUCATION:  Education details: Updated HEP. Access Code: [email or web address] Person educated: Patient Education method: Explanation, Demonstration, and Handouts Education comprehension: verbalized understanding and returned demonstration   HOME EXERCISE PROGRAM: Access Code: [facility] URL: [email or web address] Date: 09/13/2021 Prepared by: Dorene Grebe  Exercises - Standing March with Unilateral Counter Support  - 1 x daily - 7 x weekly - 1 sets - 20 reps - Sit to Stand Without Arm Support  - 1 x daily - 7 x weekly - 1 sets - 20 reps - Supine Active Straight Leg Raise  - 1 x daily - 7 x weekly - 1 sets - 20 reps - Supine March  - 1 x daily - 7 x weekly - 1 sets - 20 reps - Seated Long Arc Quad  - 1 x daily - 7 x weekly - 1 sets - 20 reps  ASSESSMENT:  CLINICAL IMPRESSION: Pt. Did really well during LE strengthening/ stability ex. With BOSU (light UE assist required).  Pt. Highly motivated and focused on consistent hip flexion/ step length to improve safety with walking.   Encouraged to continue with HEP and contact PT if any questions or issues. No c/o pain during tx. Session.  Tolerating progression of strengthening per POC well without pain. Pt will continue to benefit from skilled PT services to progress strength and balance to reduce risk of falls.     OBJECTIVE IMPAIRMENTS Abnormal gait, decreased activity tolerance, decreased balance, decreased endurance, decreased mobility, difficulty walking, decreased ROM, decreased strength, impaired flexibility, improper body mechanics, postural dysfunction, and pain.   ACTIVITY LIMITATIONS carrying, lifting,  bending, standing, squatting, stairs, transfers, and locomotion level  PARTICIPATION LIMITATIONS: cleaning, community activity, occupation, and yard work  PERSONAL FACTORS Fitness, Past/current experiences, and Profession are also affecting patient's functional outcome.   REHAB POTENTIAL: Good  CLINICAL DECISION MAKING: Evolving/moderate complexity  EVALUATION COMPLEXITY: Moderate   GOALS: Goals reviewed with patient? Yes  SHORT TERM GOALS: Target date: 10/11/21  Pt. Independent with HEP to increase B LE strength 1/2 muscle grade to improve standing/ independence with gait.   Baseline:  see above Goal status: IN PROGRESS - MMT - 3+/5 in hip flexion on 10/16/21   LONG TERM GOALS: Target date: 11/08/21  Pt. Will increase FOTO to 58 to improve functional mobility.   Baseline: Initial FOTO 44 ; 53 on 10/16/21 Goal status: IN PROGRESS  2.  Pt. Will report no low back pain consistently for 1 week with all work-related/ household tasks.   Baseline: increase back pain with increase activity.  10/16/21: Pt only experiences pain with heavy lifting and prolonged standing  Goal status: IN PROGRESS  3.  Pt. Will ambulate with more normalized gait pattern with consistent hip flexion/ step length with least assistive device safely.   Baseline: R foot lag - decreased step length  Goal status: IN PROGRESS   PLAN: PT FREQUENCY: 2x/week  PT DURATION: 8 weeks  PLANNED INTERVENTIONS: Therapeutic exercises, Therapeutic activity, Neuromuscular re-education, Balance training, Gait training, Patient/Family education, Joint mobilization, Stair training, Spinal mobilization, Cryotherapy, Moist heat, and Manual therapy.  PLAN FOR NEXT SESSION: Progress HEP, gait and stair training. Motor planning.  CHECK GOALS/SCHEDULE next tx. session  Cammie Mcgee, PT, DPT # [phone] 11/05/2021, 2:27 PM

## 2021-11-07 ENCOUNTER — Ambulatory Visit: Payer: Medicare Other | Attending: Internal Medicine | Admitting: Physical Therapy

## 2021-11-07 ENCOUNTER — Encounter: Payer: Self-pay | Admitting: Physical Therapy

## 2021-11-07 DIAGNOSIS — M6281 Muscle weakness (generalized): Secondary | ICD-10-CM | POA: Diagnosis not present

## 2021-11-07 DIAGNOSIS — M2560 Stiffness of unspecified joint, not elsewhere classified: Secondary | ICD-10-CM | POA: Diagnosis not present

## 2021-11-07 DIAGNOSIS — G8929 Other chronic pain: Secondary | ICD-10-CM | POA: Diagnosis not present

## 2021-11-07 DIAGNOSIS — M545 Low back pain, unspecified: Secondary | ICD-10-CM | POA: Diagnosis not present

## 2021-11-07 NOTE — Therapy (Signed)
OUTPATIENT PHYSICAL THERAPY THORACOLUMBAR TREATMENT     Patient Name: Larry Blair MRN: [id number] DOB:12-22-1955, 66 y.o., male Today's Date: 11/07/2021   PT End of Session - 11/07/21 1338     Visit Number 15    Number of Visits 17    Date for PT Re-Evaluation 11/08/21    PT Start Time 1338    Activity Tolerance Patient tolerated treatment well    Behavior During Therapy [facility] for tasks assessed/performed            1338 to 1428  (50 minutes).      Past Medical History:  Diagnosis Date   Allergy    Arthritis    right shoulder   Asthma    Eczema    HTN (hypertension)    Hyperlipidemia    Substance abuse ([facility]) 1976   daily   Past Surgical History:  Procedure Laterality Date   ANTERIOR CERVICAL DECOMP/DISCECTOMY FUSION N/A 08/30/2019   Procedure: CERVICAL THREE-FOUR ANTERIOR CERVICAL DECOMPRESSION/FUSION;  Surgeon: Tia Alert, MD;  Location: [facility] OR;  Service: Neurosurgery;  Laterality: N/A;   COLONOSCOPY     KNEE ARTHROSCOPY Left    Patient Active Problem List   Diagnosis Date Noted   COPD with asthma ([facility]) 08/15/2021   Chronic bilateral low back pain without sciatica 08/15/2021   Need for prophylactic vaccination and inoculation against varicella 08/15/2021   Benign prostatic hyperplasia without lower urinary tract symptoms 10/03/2020   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   Diuretic-induced hypokalemia 11/24/2017   Chronic eczema of hand 08/11/2017   Hyperlipidemia LDL goal <70 07/01/2017   Routine general medical examination at a health care facility 06/30/2017   Essential hypertension 06/30/2017   Vitamin D deficiency 06/30/2017    PCP: Etta Grandchild, MD   REFERRING PROVIDER: Etta Grandchild, MD   REFERRING DIAG: Chronic bilateral low back pain without sciatica  Rationale for Evaluation and Treatment Rehabilitation  THERAPY DIAG:  Chronic bilateral low back pain without sciatica  Joint stiffness  Muscle weakness (generalized)  ONSET DATE:  05/10/2019 (approximate date)- ACDF 08/30/19.    SUBJECTIVE:                                                                                                                                                                                           SUBJECTIVE STATEMENT: Pt. Reports no pain or new issues.  Pt. States he had an "achy discomfort" and "weak feeling" after walking around [place] after PT tx. On Monday.    PERTINENT HISTORY:  ACDF, hx of knee scope on L knee.  Still utilizes knee brace. Hx of chronic ankle rolling  bilaterally.   PAIN:  Are you having pain? Yes: NPRS scale: 0/10 Pain location: Low back Pain description: dull achy Aggravating factors: Prolonged standing at sink and carrying laundry Relieving factors: sitting, tylenol 650 mg 2 in morning   PRECAUTIONS: None  WEIGHT BEARING RESTRICTIONS No  FALLS:  Has patient fallen in last 6 months? 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    OBJECTIVE:   PATIENT SURVEYS:  FOTO initial 44/ goal 51 10/16/21: 53   SCREENING FOR RED FLAGS: Bowel or bladder incontinence: Yes: Hard to hold urine Spinal tumors: No Cauda equina syndrome: No Compression fracture: No Abdominal aneurysm: No  COGNITION:  Overall cognitive status: Within functional limits for tasks assessed     SENSATION: Light touch: Impaired  on L LE  POSTURE: rounded shoulders  LOWER/UPPER EXTREMITY ROM:     Limited L/R SLR in supine position secondary to muscle weakness.  B shoulder and  knee/LE AROM WFL  LOWER/UPPER EXTREMITY MMT:       Difficulty lifting up R hip (flexion).    MMT Right eval Left eval  Hip flexion 3/5 3/5  Hip extension 4/5 4/5  Hip abduction 4+/5 4+/5  Hip adduction 4+/5 4+/5  Hip internal rotation 4/5 5/5  Hip external rotation 4/5 5/5  Knee flexion 4/5 4/5  Knee extension 4-/5 4-/5  Ankle dorsiflexion 4/5 4/5  Ankle plantarflexion 4/5 4/5  Ankle inversion    Ankle eversion     (Blank rows = not tested)    B shoulder strength grossly: flexion 4/5 MMT, abduction 3/5, extension 4/5, bicep 5/5, tricep 5/5, sh. IR 4/5, ER 5/5 MMT.  Grip L 72#/ R 70#.       GAIT: Distance walked: in clinic (decrease hip flexion/ step length).   Assistive device utilized: Single point cane Level of assistance: Modified independence   TX:  11/07/21:  There.ex:  Nu Step L5 for 10 minutes utilizing UE and LE's for warm up and LE strengthening maintaining >100 SPM.  Walking over 6" hurdles and  recip. Cone taps at agility ladder (with and without use of of SPC)- SBA/CGA for safety.    Step lunges (12") 10x on L/R with light UE assist.  Good midline knee position.    STS from blue mat table with no UE assist required.    Walking towards mirror with consistent heel strike/ toe off and posture.  Improvement in R heel strike.  Light to medium SPC use with walking in clinic/ hallway working on recip. Pattern/ hip flexion/ heel strike.    Pt. Understands HEP and importance of daily walking.   Supine hamstring stretch/hip ER/Hip IR/SKTC: 2x30sec/LE   Supine bridging 10x (no pain reported).      Access Code: [facility] URL: [email or web address] Date: 10/23/2021 Prepared by: Ronnie Derby  Exercises - Mini Squat with Counter Support  - 1 x daily - 7 x weekly - 3 sets - 10 reps - Lunge with Counter Support  - 1 x daily - 7 x weekly - 3  sets - 6 reps - Standing Single Leg Stance with Counter Support  - 1 x daily - 7 x weekly - 2 sets - 3  reps - 30 hold - Seated Hip Flexion March with Ankle Weights  - 1 x daily - 7 x weekly - 3 sets - 10 reps - Seated Long Arc Quad with Ankle Weight  - 1 x daily - 7 x weekly - 3 sets - 10 reps - Side Stepping with Resistance at Ankles and Counter Support  - 1 x daily - 7 x weekly - 3 sets - 10 reps    PATIENT EDUCATION:  Education details: Updated HEP. Access Code: [email or web address] Person educated: Patient Education method: Explanation, Demonstration, and Handouts Education comprehension: verbalized understanding and returned demonstration   HOME EXERCISE PROGRAM: Access Code: [facility] URL: [email or web address] Date: 09/13/2021 Prepared by: Dorene Grebe  Exercises - Standing March with Unilateral Counter Support  - 1 x daily - 7 x weekly - 1 sets - 20 reps - Sit to Stand Without Arm Support  - 1 x daily - 7 x weekly - 1 sets - 20 reps - Supine Active Straight Leg Raise  - 1 x daily - 7 x weekly - 1 sets - 20 reps - Supine March  - 1 x daily - 7 x weekly - 1 sets - 20 reps - Seated Long Arc Quad  - 1 x daily - 7 x weekly - 1 sets - 20 reps  ASSESSMENT:  CLINICAL IMPRESSION: Pt. remains highly motivated and focused on consistent hip flexion/ step length to improve safety with walking.   Encouraged to continue with HEP and contact PT if any questions or issues. No c/o pain during tx. Session.  Tolerating progression of strengthening per POC well without pain. Pt will continue to benefit from skilled PT services to progress strength and balance to reduce risk of falls.     OBJECTIVE IMPAIRMENTS Abnormal gait, decreased activity tolerance, decreased balance, decreased endurance, decreased mobility, difficulty walking, decreased ROM, decreased strength, impaired flexibility, improper body mechanics, postural dysfunction, and pain.   ACTIVITY LIMITATIONS carrying, lifting, bending, standing, squatting, stairs, transfers, and locomotion level  PARTICIPATION LIMITATIONS: cleaning,  community activity, occupation, and yard work  PERSONAL FACTORS Fitness, Past/current experiences, and Profession are also affecting patient's functional outcome.   REHAB POTENTIAL: Good  CLINICAL DECISION MAKING: Evolving/moderate complexity  EVALUATION COMPLEXITY: Moderate   GOALS: Goals reviewed with patient? Yes  SHORT TERM GOALS: Target date: 10/11/21  Pt. Independent with HEP to increase B LE strength 1/2 muscle grade to improve standing/ independence with gait.   Baseline:  see above Goal status: IN PROGRESS - MMT - 3+/5 in hip flexion on 10/16/21.  R hip flexion 4/5, L hip flexion 4+/5.  B quads/ hamstring 5/5 MMT.     LONG TERM GOALS: Target date: 11/08/21  Pt. Will increase FOTO to 58 to improve functional mobility.   Baseline: Initial FOTO 44 ; 53 on 10/16/21 Goal status: IN PROGRESS  2.  Pt. Will report no low back pain consistently for 1 week with all work-related/ household tasks.   Baseline: increase back pain with increase activity.  10/16/21: Pt only experiences pain with heavy lifting and prolonged standing  Goal status: IN PROGRESS  3.  Pt. Will ambulate with more normalized gait pattern with consistent hip flexion/ step length with least assistive device safely.   Baseline: R foot lag - decreased step length  Goal status: IN PROGRESS   PLAN: PT FREQUENCY:  2x/week  PT DURATION: 8 weeks  PLANNED INTERVENTIONS: Therapeutic exercises, Therapeutic activity, Neuromuscular re-education, Balance training, Gait training, Patient/Family education, Joint mobilization, Stair training, Spinal mobilization, Cryotherapy, Moist heat, and Manual therapy.  PLAN FOR NEXT SESSION: Progress HEP, gait and stair training. Motor planning.  CHECK GOALS/RECERT next tx. Session.    Pura Spice, PT, DPT # [phone] 11/07/2021, 1:39 PM

## 2021-11-13 ENCOUNTER — Encounter: Payer: Self-pay | Admitting: Physical Therapy

## 2021-11-13 ENCOUNTER — Ambulatory Visit: Payer: Medicare Other | Admitting: Physical Therapy

## 2021-11-13 DIAGNOSIS — M545 Low back pain, unspecified: Secondary | ICD-10-CM | POA: Diagnosis not present

## 2021-11-13 DIAGNOSIS — M2560 Stiffness of unspecified joint, not elsewhere classified: Secondary | ICD-10-CM

## 2021-11-13 DIAGNOSIS — M6281 Muscle weakness (generalized): Secondary | ICD-10-CM | POA: Diagnosis not present

## 2021-11-13 DIAGNOSIS — G8929 Other chronic pain: Secondary | ICD-10-CM | POA: Diagnosis not present

## 2021-11-13 NOTE — Therapy (Signed)
OUTPATIENT PHYSICAL THERAPY THORACOLUMBAR TREATMENT/ RECERTIFICATION   Patient Name: Larry Blair MRN: [id number] DOB:February 15, 1956, 66 y.o., male Today's Date: 11/13/2021   PT End of Session - 11/13/21 1031     Visit Number 16    Number of Visits 25    Date for PT Re-Evaluation 12/11/21    PT Start Time 1024    PT Stop Time 1113    PT Time Calculation (min) 49 min    Activity Tolerance Patient tolerated treatment well    Behavior During Therapy [facility] for tasks assessed/performed             Past Medical History:  Diagnosis Date   Allergy    Arthritis    right shoulder   Asthma    Eczema    HTN (hypertension)    Hyperlipidemia    Substance abuse ([place]) 1976   daily   Past Surgical History:  Procedure Laterality Date   ANTERIOR CERVICAL DECOMP/DISCECTOMY FUSION N/A 08/30/2019   Procedure: CERVICAL THREE-FOUR ANTERIOR CERVICAL DECOMPRESSION/FUSION;  Surgeon: Eustace Moore, MD;  Location: [place];  Service: Neurosurgery;  Laterality: N/A;   COLONOSCOPY     KNEE ARTHROSCOPY Left    Patient Active Problem List   Diagnosis Date Noted   COPD with asthma ([place]) 08/15/2021   Chronic bilateral low back pain without sciatica 08/15/2021   Need for prophylactic vaccination and inoculation against varicella 08/15/2021   Benign prostatic hyperplasia without lower urinary tract symptoms 10/03/2020   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   Diuretic-induced hypokalemia 11/24/2017   Chronic eczema of hand 08/11/2017   Hyperlipidemia LDL goal <70 07/01/2017   Routine general medical examination at a health care facility 06/30/2017   Essential hypertension 06/30/2017   Vitamin D deficiency 06/30/2017    PCP: Janith Lima, MD   REFERRING PROVIDER: Janith Lima, MD   REFERRING DIAG: Chronic bilateral low back pain without sciatica  Rationale for Evaluation and Treatment Rehabilitation  THERAPY DIAG:  Chronic bilateral low back pain without sciatica  Joint stiffness  Muscle  weakness (generalized)  ONSET DATE: 05/10/2019 (approximate date)- ACDF 08/30/19.    SUBJECTIVE:                                                                                                                                                                                           SUBJECTIVE STATEMENT: Pt. Reports no new complaints.  Pt. Worked 2 days last week (Thursday/Friday) and states his back was sore over the weekend but doing better today.     PERTINENT HISTORY:  ACDF, hx of knee scope on L knee.  Still utilizes knee brace.  Hx of chronic ankle rolling bilaterally.   PAIN:  Are you having pain? Yes: NPRS scale: 0-2/10 Pain location: Low back Pain description: dull achy Aggravating factors: Prolonged standing at sink and carrying laundry Relieving factors: sitting, tylenol 650 mg 2 in morning   PRECAUTIONS: None  WEIGHT BEARING RESTRICTIONS No  FALLS:  Has patient fallen in last 6 months? 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    OBJECTIVE:   PATIENT SURVEYS:  FOTO initial 44/ goal 82 10/16/21: 19   SCREENING FOR RED FLAGS: Bowel or bladder incontinence: Yes: Hard to hold urine Spinal tumors: No Cauda equina syndrome: No Compression fracture: No Abdominal aneurysm: No  COGNITION:  Overall cognitive status: Within functional limits for tasks assessed     SENSATION: Light touch: Impaired  on L LE  POSTURE: rounded shoulders  LOWER/UPPER EXTREMITY ROM:     Limited L/R SLR in supine position secondary  to muscle weakness.  B shoulder and knee/LE AROM WFL  LOWER/UPPER EXTREMITY MMT:       Difficulty lifting up R hip (flexion).    MMT Right eval Left eval  Hip flexion 3/5 3/5  Hip extension 4/5 4/5  Hip abduction 4+/5 4+/5  Hip adduction 4+/5 4+/5  Hip internal rotation 4/5 5/5  Hip external rotation 4/5 5/5  Knee flexion 4/5 4/5  Knee extension 4-/5 4-/5  Ankle dorsiflexion 4/5 4/5  Ankle plantarflexion 4/5 4/5  Ankle inversion    Ankle eversion     (Blank rows = not tested)    B shoulder strength grossly: flexion 4/5 MMT, abduction 3/5, extension 4/5, bicep 5/5, tricep 5/5, sh. IR 4/5, ER 5/5 MMT.  Grip L 72#/ R 70#.       GAIT: Distance walked: in clinic (decrease hip flexion/ step length).   Assistive device utilized: Single point cane Level of assistance: Modified independence   TX:  11/13/21:  There.ex:  Nu Step L5 for 10 minutes utilizing UE and LE's for warm up and LE strengthening maintaining >100 SPM.  Standing hamstring stretches (2nd step) and lunges with UE assist and static holds 3x each.    Star ex. With cone taps.  SPC on side for assist as needed.  SBA from PT.  Pharmacist, hospital.    6" step ups and added Airex pad with light UE assist on L in //-bars.  Pharmacist, hospital.    Light to medium SPC use with walking in clinic/ hallway working on recip. Pattern/ hip flexion/ heel strike.    Walking towards mirror with consistent heel strike/ toe off and posture.  Improvement in R heel strike.  Supine hamstring stretch/hip ER/Hip IR/SKTC: 2x30sec/LE    Access Code: [email or web address] URL: [email or web address] Date: 10/23/2021 Prepared by: [place] with Counter Support  - 1 x daily - 7 x weekly - 3 sets - 10 reps - Lunge with Counter Support  - 1 x daily - 7 x weekly - 3 sets - 6 reps - Standing Single Leg Stance  with Counter Support  - 1 x daily - 7 x weekly - 2 sets - 3 reps - 30 hold - Seated Hip Flexion March with  Ankle Weights  - 1 x daily - 7 x weekly - 3 sets - 10 reps - Seated Long Arc Quad with Ankle Weight  - 1 x daily - 7 x weekly - 3 sets - 10 reps - Side Stepping with Resistance at Ankles and Counter Support  - 1 x daily - 7 x weekly - 3 sets - 10 reps    PATIENT EDUCATION:  Education details: Updated HEP. Access Code: [email or web address] Person educated: Patient Education method: Explanation, Demonstration, and Handouts Education comprehension: verbalized understanding and returned demonstration   HOME EXERCISE PROGRAM: Access Code: [facility] URL: [email or web address] Date: 09/13/2021 Prepared by: Dorcas Carrow  Exercises - Standing March with Unilateral Counter Support  - 1 x daily - 7 x weekly - 1 sets - 20 reps - Sit to Stand Without Arm Support  - 1 x daily - 7 x weekly - 1 sets - 20 reps - Supine Active Straight Leg Raise  - 1 x daily - 7 x weekly - 1 sets - 20 reps - Supine March  - 1 x daily - 7 x weekly - 1 sets - 20 reps - Seated Long Arc Quad  - 1 x daily - 7 x weekly - 1 sets - 20 reps  ASSESSMENT:  CLINICAL IMPRESSION: Pt. remains highly motivated and focused on consistent hip flexion/ step length to improve safety with walking.   Encouraged pt. to continue with HEP and contact PT if any questions or issues. No c/o pain during tx. Session.  Tolerating progression of strengthening per POC well without pain.  See updated goals.  Pt will continue to benefit from skilled PT services to progress strength and balance to reduce risk of falls.     OBJECTIVE IMPAIRMENTS Abnormal gait, decreased activity tolerance, decreased balance, decreased endurance, decreased mobility, difficulty walking, decreased ROM, decreased strength, impaired flexibility, improper body mechanics, postural dysfunction, and pain.   ACTIVITY LIMITATIONS carrying, lifting, bending, standing, squatting, stairs, transfers, and locomotion level  PARTICIPATION LIMITATIONS: cleaning, community activity,  occupation, and yard work  PERSONAL FACTORS Fitness, Past/current experiences, and Profession are also affecting patient's functional outcome.   REHAB POTENTIAL: Good  CLINICAL DECISION MAKING: Evolving/moderate complexity  EVALUATION COMPLEXITY: Moderate   GOALS: Goals reviewed with patient? Yes  SHORT TERM GOALS: Target date: 11/27/21  Pt. Independent with HEP to increase B LE strength 1/2 muscle grade to improve standing/ independence with gait.   Baseline:  see above Goal status: Partially met - MMT - 11/13/21 R hip flexion 4/5, L hip flexion 4+/5.  B quads/ hamstring 5/5 MMT.     LONG TERM GOALS: Target date: 12/11/21  Pt. Will increase FOTO to 58 to improve functional mobility.   Baseline: Initial FOTO 44 ; 53 on 10/16/21 Goal status: IN PROGRESS  2.  Pt. Will report no low back pain consistently for 1 week with all work-related/ household tasks.   Baseline: increase back pain with increase activity.  10/16/21: Pt only experiences pain with heavy lifting and prolonged standing  Goal status: Partially met  3.  Pt. Will ambulate with more normalized gait pattern with consistent hip flexion/ step length with least assistive device safely.   Baseline: R foot lag - decreased step length  Goal status: Partially met   PLAN: PT FREQUENCY: 2x/week  PT DURATION: 8 weeks  PLANNED INTERVENTIONS:  Therapeutic exercises, Therapeutic activity, Neuromuscular re-education, Balance training, Gait training, Patient/Family education, Joint mobilization, Stair training, Spinal mobilization, Cryotherapy, Moist heat, and Manual therapy.  PLAN FOR NEXT SESSION: Progress HEP, gait and stair training. Motor planning.   Pura Spice, PT, DPT # [phone] 11/13/2021, 12:28 PM

## 2021-11-15 ENCOUNTER — Encounter: Payer: Self-pay | Admitting: Physical Therapy

## 2021-11-15 ENCOUNTER — Ambulatory Visit: Payer: Medicare Other | Admitting: Physical Therapy

## 2021-11-15 DIAGNOSIS — M2560 Stiffness of unspecified joint, not elsewhere classified: Secondary | ICD-10-CM | POA: Diagnosis not present

## 2021-11-15 DIAGNOSIS — M545 Low back pain, unspecified: Secondary | ICD-10-CM

## 2021-11-15 DIAGNOSIS — G8929 Other chronic pain: Secondary | ICD-10-CM | POA: Diagnosis not present

## 2021-11-15 DIAGNOSIS — M6281 Muscle weakness (generalized): Secondary | ICD-10-CM

## 2021-11-15 NOTE — Therapy (Signed)
OUTPATIENT PHYSICAL THERAPY THORACOLUMBAR TREATMENT   Patient Name: Larry Blair MRN: [id number] DOB:May 20, 1955, 66 y.o., male Today's Date: 11/15/2021   PT End of Session - 11/15/21 1032     Visit Number 17    Number of Visits 25    Date for PT Re-Evaluation 12/11/21    PT Start Time 1032    PT Stop Time 1116    PT Time Calculation (min) 44 min    Activity Tolerance Patient tolerated treatment well    Behavior During Therapy [facility] for tasks assessed/performed             Past Medical History:  Diagnosis Date   Allergy    Arthritis    right shoulder   Asthma    Eczema    HTN (hypertension)    Hyperlipidemia    Substance abuse ([place]) 1976   daily   Past Surgical History:  Procedure Laterality Date   ANTERIOR CERVICAL DECOMP/DISCECTOMY FUSION N/A 08/30/2019   Procedure: CERVICAL THREE-FOUR ANTERIOR CERVICAL DECOMPRESSION/FUSION;  Surgeon: Eustace Moore, MD;  Location: [place];  Service: Neurosurgery;  Laterality: N/A;   COLONOSCOPY     KNEE ARTHROSCOPY Left    Patient Active Problem List   Diagnosis Date Noted   COPD with asthma ([place]) 08/15/2021   Chronic bilateral low back pain without sciatica 08/15/2021   Need for prophylactic vaccination and inoculation against varicella 08/15/2021   Benign prostatic hyperplasia without lower urinary tract symptoms 10/03/2020   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   Diuretic-induced hypokalemia 11/24/2017   Chronic eczema of hand 08/11/2017   Hyperlipidemia LDL goal <70 07/01/2017   Routine general medical examination at a health care facility 06/30/2017   Essential hypertension 06/30/2017   Vitamin D deficiency 06/30/2017    PCP: Janith Lima, MD   REFERRING PROVIDER: Janith Lima, MD   REFERRING DIAG: Chronic bilateral low back pain without sciatica  Rationale for Evaluation and Treatment Rehabilitation  THERAPY DIAG:  Chronic bilateral low back pain without sciatica  Joint stiffness  Muscle weakness  (generalized)  ONSET DATE: 05/10/2019 (approximate date)- ACDF 08/30/19.    SUBJECTIVE:                                                                                                                                                                                           SUBJECTIVE STATEMENT: Pt. Reports no new complaints.  No c/o back pain at this time.     PERTINENT HISTORY:  ACDF, hx of knee scope on L knee.  Still utilizes knee brace. Hx of chronic ankle rolling bilaterally.   PAIN:  Are you having pain?  Yes: NPRS scale: 0-2/10 Pain location: Low back Pain description: dull achy Aggravating factors: Prolonged standing at sink and carrying laundry Relieving factors: sitting, tylenol 650 mg 2 in morning   PRECAUTIONS: None  WEIGHT BEARING RESTRICTIONS No  FALLS:  Has patient fallen in last 6 months? 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    OBJECTIVE:   PATIENT SURVEYS:  FOTO initial 44/ goal 75 10/16/21: 57   SCREENING FOR RED FLAGS: Bowel or bladder incontinence: Yes: Hard to hold urine Spinal tumors: No Cauda equina syndrome: No Compression fracture: No Abdominal aneurysm: No  COGNITION:  Overall cognitive status: Within functional limits for tasks assessed     SENSATION: Light touch: Impaired  on L LE  POSTURE: rounded shoulders  LOWER/UPPER EXTREMITY ROM:     Limited L/R SLR in supine position secondary to muscle weakness.  B shoulder and knee/LE AROM WFL  LOWER/UPPER EXTREMITY MMT:        Difficulty lifting up R hip (flexion).    MMT Right eval Left eval  Hip flexion 3/5 3/5  Hip extension 4/5 4/5  Hip abduction 4+/5 4+/5  Hip adduction 4+/5 4+/5  Hip internal rotation 4/5 5/5  Hip external rotation 4/5 5/5  Knee flexion 4/5 4/5  Knee extension 4-/5 4-/5  Ankle dorsiflexion 4/5 4/5  Ankle plantarflexion 4/5 4/5  Ankle inversion    Ankle eversion     (Blank rows = not tested)    B shoulder strength grossly: flexion 4/5 MMT, abduction 3/5, extension 4/5, bicep 5/5, tricep 5/5, sh. IR 4/5, ER 5/5 MMT.  Grip L 72#/ R 70#.       GAIT: Distance walked: in clinic (decrease hip flexion/ step length).   Assistive device utilized: Single point cane Level of assistance: Modified independence   TX:  11/15/21:  There.ex:  Nustep L6 for 10 minutes utilizing UE and LE's for warm up and LE strengthening maintaining >100 SPM.  Walking lunges in //-bars with static holds 4 laps.  Resisted gait 2BTB 5x all 4-planes with min. To no UE assist.   Ascending/ descending stairs with recip. Pattern and min. To no UE assist.    Walking in hallway with alt. UE and LE touches. Cuing to slow down.   Tandem Airex pads:  lateral walking with no UE assist (mirror feedback for upright posture).  5# ankle wts.: walking in hallway/ lateral walking with light to no UE assist.    Seated hamstring stretches with static holds 3x each on L/R.    Access Code: [facility] URL: [email or web address] Date: 10/23/2021 Prepared by: [place] with Counter Support  - 1 x daily - 7 x weekly - 3 sets - 10 reps - Lunge with Counter Support  - 1 x daily - 7 x weekly - 3 sets - 6 reps - Standing Single Leg Stance with Counter Support  - 1 x daily - 7 x weekly - 2 sets - 3 reps - 30 hold - Seated Hip Flexion  March with Ankle Weights  - 1 x daily - 7 x weekly - 3 sets - 10 reps - Seated Long Arc Quad with Ankle Weight  - 1 x daily - 7 x weekly - 3 sets  - 10 reps - Side Stepping with Resistance at Ankles and Counter Support  - 1 x daily - 7 x weekly - 3 sets - 10 reps    PATIENT EDUCATION:  Education details: Updated HEP. Access Code: [email or web address] Person educated: Patient Education method: Explanation, Demonstration, and Handouts Education comprehension: verbalized understanding and returned demonstration   HOME EXERCISE PROGRAM: Access Code: [facility] URL: [email or web address] Date: 09/13/2021 Prepared by: Dorcas Carrow  Exercises - Standing March with Unilateral Counter Support  - 1 x daily - 7 x weekly - 1 sets - 20 reps - Sit to Stand Without Arm Support  - 1 x daily - 7 x weekly - 1 sets - 20 reps - Supine Active Straight Leg Raise  - 1 x daily - 7 x weekly - 1 sets - 20 reps - Supine March  - 1 x daily - 7 x weekly - 1 sets - 20 reps - Seated Long Arc Quad  - 1 x daily - 7 x weekly - 1 sets - 20 reps  ASSESSMENT:  CLINICAL IMPRESSION: Pt. remains highly motivated and focused on consistent hip flexion/ step length to improve safety with walking.   Encouraged pt. to continue with HEP and contact PT if any questions or issues. No c/o pain during tx. Session.  Tolerating progression of strengthening per POC well without pain.  Pt. Requires decrease UE assist with resisted gait/ Airex pad ex.  Pt will continue to benefit from skilled PT services to progress strength and balance to reduce risk of falls.     OBJECTIVE IMPAIRMENTS Abnormal gait, decreased activity tolerance, decreased balance, decreased endurance, decreased mobility, difficulty walking, decreased ROM, decreased strength, impaired flexibility, improper body mechanics, postural dysfunction, and pain.   ACTIVITY LIMITATIONS carrying, lifting, bending, standing, squatting, stairs, transfers, and locomotion level  PARTICIPATION LIMITATIONS: cleaning, community activity, occupation, and yard work  PERSONAL FACTORS Fitness, Past/current experiences, and  Profession are also affecting patient's functional outcome.   REHAB POTENTIAL: Good  CLINICAL DECISION MAKING: Evolving/moderate complexity  EVALUATION COMPLEXITY: Moderate   GOALS: Goals reviewed with patient? Yes  SHORT TERM GOALS: Target date: 11/27/21  Pt. Independent with HEP to increase B LE strength 1/2 muscle grade to improve standing/ independence with gait.   Baseline:  see above Goal status: Partially met - MMT - 11/13/21 R hip flexion 4/5, L hip flexion 4+/5.  B quads/ hamstring 5/5 MMT.     LONG TERM GOALS: Target date: 12/11/21  Pt. Will increase FOTO to 58 to improve functional mobility.   Baseline: Initial FOTO 44 ; 53 on 10/16/21 Goal status: IN PROGRESS  2.  Pt. Will report no low back pain consistently for 1 week with all work-related/ household tasks.   Baseline: increase back pain with increase activity.  10/16/21: Pt only experiences pain with heavy lifting and prolonged standing  Goal status: Partially met  3.  Pt. Will ambulate with more normalized gait pattern with consistent hip flexion/ step length with least assistive device safely.   Baseline: R foot lag - decreased step length  Goal status: Partially met   PLAN: PT FREQUENCY: 2x/week  PT DURATION: 8 weeks  PLANNED INTERVENTIONS: Therapeutic exercises, Therapeutic activity, Neuromuscular re-education, Balance training, Gait training, Patient/Family education, Joint mobilization, Stair training, Spinal  mobilization, Cryotherapy, Moist heat, and Manual therapy.  PLAN FOR NEXT SESSION: Progress HEP, gait and stair training. Motor planning.   Pura Spice, PT, DPT # [phone] 11/15/2021, 2:56 PM

## 2021-11-19 ENCOUNTER — Ambulatory Visit: Payer: Medicare Other | Admitting: Physical Therapy

## 2021-11-19 ENCOUNTER — Encounter: Payer: Self-pay | Admitting: Physical Therapy

## 2021-11-19 DIAGNOSIS — M6281 Muscle weakness (generalized): Secondary | ICD-10-CM

## 2021-11-19 DIAGNOSIS — M2560 Stiffness of unspecified joint, not elsewhere classified: Secondary | ICD-10-CM | POA: Diagnosis not present

## 2021-11-19 DIAGNOSIS — G8929 Other chronic pain: Secondary | ICD-10-CM

## 2021-11-19 DIAGNOSIS — M545 Low back pain, unspecified: Secondary | ICD-10-CM | POA: Diagnosis not present

## 2021-11-19 NOTE — Therapy (Signed)
OUTPATIENT PHYSICAL THERAPY THORACOLUMBAR TREATMENT   Patient Name: Larry Blair MRN: [id number] DOB:20-Sep-1955, 66 y.o., male Today's Date: 11/19/2021   PT End of Session - 11/19/21 1119     Visit Number 18    Number of Visits 25    Date for PT Re-Evaluation 12/11/21    PT Start Time 1115    PT Stop Time 1201    PT Time Calculation (min) 46 min    Activity Tolerance Patient tolerated treatment well    Behavior During Therapy [facility] for tasks assessed/performed             Past Medical History:  Diagnosis Date   Allergy    Arthritis    right shoulder   Asthma    Eczema    HTN (hypertension)    Hyperlipidemia    Substance abuse ([place]) 1976   daily   Past Surgical History:  Procedure Laterality Date   ANTERIOR CERVICAL DECOMP/DISCECTOMY FUSION N/A 08/30/2019   Procedure: CERVICAL THREE-FOUR ANTERIOR CERVICAL DECOMPRESSION/FUSION;  Surgeon: Eustace Moore, MD;  Location: [place];  Service: Neurosurgery;  Laterality: N/A;   COLONOSCOPY     KNEE ARTHROSCOPY Left    Patient Active Problem List   Diagnosis Date Noted   COPD with asthma ([place]) 08/15/2021   Chronic bilateral low back pain without sciatica 08/15/2021   Need for prophylactic vaccination and inoculation against varicella 08/15/2021   Benign prostatic hyperplasia without lower urinary tract symptoms 10/03/2020   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   Diuretic-induced hypokalemia 11/24/2017   Chronic eczema of hand 08/11/2017   Hyperlipidemia LDL goal <70 07/01/2017   Routine general medical examination at a health care facility 06/30/2017   Essential hypertension 06/30/2017   Vitamin D deficiency 06/30/2017    PCP: Janith Lima, MD   REFERRING PROVIDER: Janith Lima, MD   REFERRING DIAG: Chronic bilateral low back pain without sciatica  Rationale for Evaluation and Treatment Rehabilitation  THERAPY DIAG:  Chronic bilateral low back pain without sciatica  Joint stiffness  Muscle weakness  (generalized)  ONSET DATE: 05/10/2019 (approximate date)- ACDF 08/30/19.    SUBJECTIVE:                                                                                                                                                                                           SUBJECTIVE STATEMENT: Pt. Reports no new complaints.  No c/o back pain at this time.  Pt. States he worked on Saturday and low back was sore/ achy.    PERTINENT HISTORY:  ACDF, hx of knee scope on L knee.  Still utilizes knee brace. Hx of  chronic ankle rolling bilaterally.   PAIN:  Are you having pain? Yes: NPRS scale: 0-2/10 Pain location: Low back Pain description: dull achy Aggravating factors: Prolonged standing at sink and carrying laundry Relieving factors: sitting, tylenol 650 mg 2 in morning   PRECAUTIONS: None  WEIGHT BEARING RESTRICTIONS No  FALLS:  Has patient fallen in last 6 months? 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    OBJECTIVE:   PATIENT SURVEYS:  FOTO initial 44/ goal 54 10/16/21: 34   SCREENING FOR RED FLAGS: Bowel or bladder incontinence: Yes: Hard to hold urine Spinal tumors: No Cauda equina syndrome: No Compression fracture: No Abdominal aneurysm: No  COGNITION:  Overall cognitive status: Within functional limits for tasks assessed     SENSATION: Light touch: Impaired  on L LE  POSTURE: rounded shoulders  LOWER/UPPER EXTREMITY ROM:     Limited L/R SLR in supine position secondary to muscle weakness.  B shoulder  and knee/LE AROM WFL  LOWER/UPPER EXTREMITY MMT:       Difficulty lifting up R hip (flexion).    MMT Right eval Left eval  Hip flexion 3/5 3/5  Hip extension 4/5 4/5  Hip abduction 4+/5 4+/5  Hip adduction 4+/5 4+/5  Hip internal rotation 4/5 5/5  Hip external rotation 4/5 5/5  Knee flexion 4/5 4/5  Knee extension 4-/5 4-/5  Ankle dorsiflexion 4/5 4/5  Ankle plantarflexion 4/5 4/5  Ankle inversion    Ankle eversion     (Blank rows = not tested)    B shoulder strength grossly: flexion 4/5 MMT, abduction 3/5, extension 4/5, bicep 5/5, tricep 5/5, sh. IR 4/5, ER 5/5 MMT.  Grip L 72#/ R 70#.       GAIT: Distance walked: in clinic (decrease hip flexion/ step length).   Assistive device utilized: Single point cane Level of assistance: Modified independence   TX:  11/19/21:  There.ex:  Nustep L6 for 10 minutes utilizing UE and LE's for warm up and LE strengthening maintaining >100 SPM.  Tall kneeling/ return to stand in front of //-bars (use of blue mat on floor).  10x on L/R LE.    5# ankle wts.: walking in hallway 2 laps/ lateral walking with light to no UE assist in //-bars.  Seated marching/ LAQ/ heel and toe raises 20x./ standing 12" step touches/ hamstring curls 20x.  Moderate B hip flexor fatigue (R > L).    6" step ups/downs with no UE assist 10x L/R in //-bars.  Added cones on top of 6" step with LE touches (at least 1 UE assist required for safety).    Walking outside up/down curb and in grassy terrain with consistent hip flexion/ step pattern.  No LOB.  Fatigue noted in R LE.     Access Code: [facility] URL: [email or web address] Date: 10/23/2021 Prepared by: [place] with Counter Support  - 1 x daily - 7 x weekly - 3 sets - 10 reps - Lunge with Counter Support  - 1 x  daily - 7 x weekly - 3 sets - 6 reps - Standing Single Leg Stance with Counter Support  - 1 x daily - 7 x weekly - 2 sets - 3 reps - 30 hold - Seated  Hip Flexion March with Ankle Weights  - 1 x daily - 7 x weekly - 3 sets - 10 reps - Seated Long Arc Quad with Ankle Weight  - 1 x daily - 7 x weekly - 3 sets - 10 reps - Side Stepping with Resistance at Ankles and Counter Support  - 1 x daily - 7 x weekly - 3 sets - 10 reps    PATIENT EDUCATION:  Education details: Updated HEP. Access Code: [email or web address] Person educated: Patient Education method: Explanation, Demonstration, and Handouts Education comprehension: verbalized understanding and returned demonstration   HOME EXERCISE PROGRAM: Access Code: [facility] URL: [email or web address] Date: 09/13/2021 Prepared by: Dorcas Carrow  Exercises - Standing March with Unilateral Counter Support  - 1 x daily - 7 x weekly - 1 sets - 20 reps - Sit to Stand Without Arm Support  - 1 x daily - 7 x weekly - 1 sets - 20 reps - Supine Active Straight Leg Raise  - 1 x daily - 7 x weekly - 1 sets - 20 reps - Supine March  - 1 x daily - 7 x weekly - 1 sets - 20 reps - Seated Long Arc Quad  - 1 x daily - 7 x weekly - 1 sets - 20 reps  ASSESSMENT:  CLINICAL IMPRESSION: Pt. remains highly motivated and focused on consistent hip flexion/ step length to improve safety with walking.   Encouraged pt. to continue with HEP and contact PT if any questions or issues. No c/o pain during tx. Session.  Tolerating progression of strengthening per POC well without pain.  Pt. Requires decrease UE assist with step ups/ touches.  Pt. Requires use of SPC with 2-point gait pattern to increase hip flexion/ heel strike, esp. On R.  Pt will continue to benefit from skilled PT services to progress strength and balance to reduce risk of falls.     OBJECTIVE IMPAIRMENTS Abnormal gait, decreased activity tolerance, decreased balance, decreased endurance, decreased mobility, difficulty walking, decreased ROM, decreased strength, impaired flexibility, improper body mechanics, postural dysfunction, and pain.   ACTIVITY  LIMITATIONS carrying, lifting, bending, standing, squatting, stairs, transfers, and locomotion level  PARTICIPATION LIMITATIONS: cleaning, community activity, occupation, and yard work  PERSONAL FACTORS Fitness, Past/current experiences, and Profession are also affecting patient's functional outcome.   REHAB POTENTIAL: Good  CLINICAL DECISION MAKING: Evolving/moderate complexity  EVALUATION COMPLEXITY: Moderate   GOALS: Goals reviewed with patient? Yes  SHORT TERM GOALS: Target date: 11/27/21  Pt. Independent with HEP to increase B LE strength 1/2 muscle grade to improve standing/ independence with gait.   Baseline:  see above Goal status: Partially met - MMT - 11/13/21 R hip flexion 4/5, L hip flexion 4+/5.  B quads/ hamstring 5/5 MMT.     LONG TERM GOALS: Target date: 12/11/21  Pt. Will increase FOTO to 58 to improve functional mobility.   Baseline: Initial FOTO 44 ; 53 on 10/16/21 Goal status: IN PROGRESS  2.  Pt. Will report no low back pain consistently for 1 week with all work-related/ household tasks.   Baseline: increase back pain with increase activity.  10/16/21: Pt only experiences pain with heavy lifting and prolonged standing  Goal status: Partially met  3.  Pt. Will ambulate with more normalized gait  pattern with consistent hip flexion/ step length with least assistive device safely.   Baseline: R foot lag - decreased step length  Goal status: Partially met   PLAN: PT FREQUENCY: 2x/week  PT DURATION: 8 weeks  PLANNED INTERVENTIONS: Therapeutic exercises, Therapeutic activity, Neuromuscular re-education, Balance training, Gait training, Patient/Family education, Joint mobilization, Stair training, Spinal mobilization, Cryotherapy, Moist heat, and Manual therapy.  PLAN FOR NEXT SESSION: Progress HEP, gait and stair training. Motor planning.   Pura Spice, PT, DPT # [phone] 11/19/2021, 12:09 PM

## 2021-11-21 ENCOUNTER — Ambulatory Visit: Payer: Medicare Other | Admitting: Physical Therapy

## 2021-11-21 ENCOUNTER — Encounter: Payer: Self-pay | Admitting: Physical Therapy

## 2021-11-21 DIAGNOSIS — M2560 Stiffness of unspecified joint, not elsewhere classified: Secondary | ICD-10-CM | POA: Diagnosis not present

## 2021-11-21 DIAGNOSIS — M545 Low back pain, unspecified: Secondary | ICD-10-CM | POA: Diagnosis not present

## 2021-11-21 DIAGNOSIS — G8929 Other chronic pain: Secondary | ICD-10-CM | POA: Diagnosis not present

## 2021-11-21 DIAGNOSIS — M6281 Muscle weakness (generalized): Secondary | ICD-10-CM

## 2021-11-21 NOTE — Therapy (Signed)
OUTPATIENT PHYSICAL THERAPY THORACOLUMBAR TREATMENT   Patient Name: Damarious Holtsclaw MRN: [id number] DOB:05/07/1955, 66 y.o., male Today's Date: 11/21/2021   PT End of Session - 11/21/21 1114     Visit Number 19    Number of Visits 25    Date for PT Re-Evaluation 12/11/21    PT Start Time 1114    PT Stop Time 1154    PT Time Calculation (min) 40 min    Activity Tolerance Patient tolerated treatment well    Behavior During Therapy [facility] for tasks assessed/performed             Past Medical History:  Diagnosis Date   Allergy    Arthritis    right shoulder   Asthma    Eczema    HTN (hypertension)    Hyperlipidemia    Substance abuse ([place]) 1976   daily   Past Surgical History:  Procedure Laterality Date   ANTERIOR CERVICAL DECOMP/DISCECTOMY FUSION N/A 08/30/2019   Procedure: CERVICAL THREE-FOUR ANTERIOR CERVICAL DECOMPRESSION/FUSION;  Surgeon: Eustace Moore, MD;  Location: [place];  Service: Neurosurgery;  Laterality: N/A;   COLONOSCOPY     KNEE ARTHROSCOPY Left    Patient Active Problem List   Diagnosis Date Noted   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/15/2021   Chronic bilateral low back pain without sciatica 08/15/2021   Need for prophylactic vaccination and inoculation against varicella 08/15/2021   Benign prostatic hyperplasia without lower urinary tract symptoms 10/03/2020   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   Diuretic-induced hypokalemia 11/24/2017   Chronic eczema of hand 08/11/2017   Hyperlipidemia LDL goal <70 07/01/2017   Routine general medical examination at a health care facility 06/30/2017   Essential hypertension 06/30/2017   Vitamin D deficiency 06/30/2017    PCP: Janith Lima, MD   REFERRING PROVIDER: Janith Lima, MD   REFERRING DIAG: Chronic bilateral low back pain without sciatica  Rationale for Evaluation and Treatment Rehabilitation  THERAPY DIAG:  Chronic bilateral low back pain without sciatica  Joint stiffness  Muscle weakness  (generalized)  ONSET DATE: 05/10/2019 (approximate date)- ACDF 08/30/19.    SUBJECTIVE:                                                                                                                                                                                           SUBJECTIVE STATEMENT: Pt. Reports no new complaints.  No c/o back pain prior to PT tx. Session.  Pt. States he worked yesterday with no back issues but quads were sore/hurting.    PERTINENT HISTORY:  ACDF, hx of knee scope on L knee.  Still utilizes knee  brace. Hx of chronic ankle rolling bilaterally.   PAIN:  Are you having pain? Yes: NPRS scale: 0-2/10 Pain location: Low back Pain description: dull achy Aggravating factors: Prolonged standing at sink and carrying laundry Relieving factors: sitting, tylenol 650 mg 2 in morning   PRECAUTIONS: None  WEIGHT BEARING RESTRICTIONS No  FALLS:  Has patient fallen in last 6 months? 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    OBJECTIVE:   PATIENT SURVEYS:  FOTO initial 44/ goal 76 10/16/21: 33   SCREENING FOR RED FLAGS: Bowel or bladder incontinence: Yes: Hard to hold urine Spinal tumors: No Cauda equina syndrome: No Compression fracture: No Abdominal aneurysm: No  COGNITION:  Overall cognitive status: Within functional limits for tasks assessed     SENSATION: Light touch: Impaired  on L LE  POSTURE: rounded shoulders  LOWER/UPPER EXTREMITY ROM:     Limited L/R SLR in supine position secondary to  muscle weakness.  B shoulder and knee/LE AROM WFL  LOWER/UPPER EXTREMITY MMT:       Difficulty lifting up R hip (flexion).    MMT Right eval Left eval  Hip flexion 3/5 3/5  Hip extension 4/5 4/5  Hip abduction 4+/5 4+/5  Hip adduction 4+/5 4+/5  Hip internal rotation 4/5 5/5  Hip external rotation 4/5 5/5  Knee flexion 4/5 4/5  Knee extension 4-/5 4-/5  Ankle dorsiflexion 4/5 4/5  Ankle plantarflexion 4/5 4/5  Ankle inversion    Ankle eversion     (Blank rows = not tested)    B shoulder strength grossly: flexion 4/5 MMT, abduction 3/5, extension 4/5, bicep 5/5, tricep 5/5, sh. IR 4/5, ER 5/5 MMT.  Grip L 72#/ R 70#.       GAIT: Distance walked: in clinic (decrease hip flexion/ step length).   Assistive device utilized: Single point cane Level of assistance: Modified independence   TX:  11/21/21:  There.ex:  Nustep L6 for 10 minutes B UE/LE's for warm up and LE strengthening maintaining >100 SPM.    BOSU lunges with no UE assist and step ups/ downs with light UE assist for safety/ mirror feedback 10x each.    Walking lunges in //-bars with holds as tolerated (4 laps).    5# ankle wts.: Seated marching/ LAQ/ heel and toe raises 20x. step ups/downs 10x.  Walking in //-bars forward/ backwards/lateral 5 laps with light to no UE assist in //-bars.  Standing hamstring curls 20x.     6"/12" step touches with minimal to no UE assist 10x L/R in //-bars.     Discussed HEP      Access Code: [email or web address] URL: [email or web address] Date: 10/23/2021 Prepared by: [place] with Counter Support  - 1 x daily - 7 x weekly - 3 sets - 10 reps - Lunge with Counter Support  - 1 x daily - 7 x weekly - 3 sets - 6 reps - Standing Single Leg Stance with Counter Support  - 1 x daily - 7 x  weekly - 2 sets - 3 reps - 30 hold - Seated Hip Flexion March with Ankle Weights  - 1 x daily - 7 x weekly - 3 sets - 10 reps - Seated Long Arc Quad with  Ankle Weight  - 1 x daily - 7 x weekly - 3 sets - 10 reps - Side Stepping with Resistance at Ankles and Counter Support  - 1 x daily - 7 x weekly - 3 sets - 10 reps    PATIENT EDUCATION:  Education details: Updated HEP. Access Code: [email or web address] Person educated: Patient Education method: Explanation, Demonstration, and Handouts Education comprehension: verbalized understanding and returned demonstration   HOME EXERCISE PROGRAM: Access Code: [facility] URL: [email or web address] Date: 09/13/2021 Prepared by: Dorcas Carrow  Exercises - Standing March with Unilateral Counter Support  - 1 x daily - 7 x weekly - 1 sets - 20 reps - Sit to Stand Without Arm Support  - 1 x daily - 7 x weekly - 1 sets - 20 reps - Supine Active Straight Leg Raise  - 1 x daily - 7 x weekly - 1 sets - 20 reps - Supine March  - 1 x daily - 7 x weekly - 1 sets - 20 reps - Seated Long Arc Quad  - 1 x daily - 7 x weekly - 1 sets - 20 reps  ASSESSMENT:  CLINICAL IMPRESSION: Pt. Reports no c/o back pain during tx. Session and works hard with LE strengthening.  Pt. tolerates progression of strengthening ex. per POC without pain.  Pt. Requires decrease UE assist with step ups/ touches.  Pt. Requires use of SPC with 2-point gait pattern to increase hip flexion/ heel strike, esp. On R.  Fatigue noted in B hips at end of tx. Session.   Pt will continue to benefit from skilled PT services to progress strength and balance to reduce risk of falls.     OBJECTIVE IMPAIRMENTS Abnormal gait, decreased activity tolerance, decreased balance, decreased endurance, decreased mobility, difficulty walking, decreased ROM, decreased strength, impaired flexibility, improper body mechanics, postural dysfunction, and pain.   ACTIVITY LIMITATIONS carrying, lifting, bending, standing, squatting, stairs, transfers, and locomotion level  PARTICIPATION LIMITATIONS: cleaning, community activity, occupation, and yard work  PERSONAL  FACTORS Fitness, Past/current experiences, and Profession are also affecting patient's functional outcome.   REHAB POTENTIAL: Good  CLINICAL DECISION MAKING: Evolving/moderate complexity  EVALUATION COMPLEXITY: Moderate   GOALS: Goals reviewed with patient? Yes  SHORT TERM GOALS: Target date: 11/27/21  Pt. Independent with HEP to increase B LE strength 1/2 muscle grade to improve standing/ independence with gait.   Baseline:  see above Goal status: Partially met - MMT - 11/13/21 R hip flexion 4/5, L hip flexion 4+/5.  B quads/ hamstring 5/5 MMT.     LONG TERM GOALS: Target date: 12/11/21  Pt. Will increase FOTO to 58 to improve functional mobility.   Baseline: Initial FOTO 44 ; 53 on 10/16/21 Goal status: IN PROGRESS  2.  Pt. Will report no low back pain consistently for 1 week with all work-related/ household tasks.   Baseline: increase back pain with increase activity.  10/16/21: Pt only experiences pain with heavy lifting and prolonged standing  Goal status: Partially met  3.  Pt. Will ambulate with more normalized gait pattern with consistent hip flexion/ step length with least assistive device safely.   Baseline: R foot lag - decreased step length  Goal status: Partially met   PLAN: PT FREQUENCY: 2x/week  PT DURATION: 8 weeks  PLANNED INTERVENTIONS: Therapeutic exercises, Therapeutic activity, Neuromuscular re-education, Balance training, Gait training, Patient/Family education, Joint mobilization, Stair training, Spinal mobilization, Cryotherapy, Moist heat, and Manual therapy.  PLAN FOR NEXT SESSION: Progress HEP, gait and stair training. Motor planning.   Pura Spice, PT, DPT # [phone] 11/21/2021, 12:10 PM

## 2021-11-27 ENCOUNTER — Ambulatory Visit: Payer: Medicare Other | Admitting: Physical Therapy

## 2021-11-27 DIAGNOSIS — M2560 Stiffness of unspecified joint, not elsewhere classified: Secondary | ICD-10-CM | POA: Diagnosis not present

## 2021-11-27 DIAGNOSIS — G8929 Other chronic pain: Secondary | ICD-10-CM | POA: Diagnosis not present

## 2021-11-27 DIAGNOSIS — M545 Low back pain, unspecified: Secondary | ICD-10-CM | POA: Diagnosis not present

## 2021-11-27 DIAGNOSIS — M6281 Muscle weakness (generalized): Secondary | ICD-10-CM

## 2021-11-27 NOTE — Therapy (Signed)
OUTPATIENT PHYSICAL THERAPY THORACOLUMBAR TREATMENT Physical Therapy Progress Note   Dates of reporting period  10/18/21  to  11/27/21    Patient Name: Larry Blair MRN: [id number] DOB:Sep 04, 1955, 66 y.o., male Today's Date: 11/27/2021   PT End of Session - 11/27/21 1348     Visit Number 20    Number of Visits 25    Date for PT Re-Evaluation 12/11/21    PT Start Time 5537    PT Stop Time 1426    PT Time Calculation (min) 49 min    Activity Tolerance Patient tolerated treatment well    Behavior During Therapy [facility] for tasks assessed/performed              Past Medical History:  Diagnosis Date   Allergy    Arthritis    right shoulder   Asthma    Eczema    HTN (hypertension)    Hyperlipidemia    Substance abuse ([place]) 1976   daily   Past Surgical History:  Procedure Laterality Date   ANTERIOR CERVICAL DECOMP/DISCECTOMY FUSION N/A 08/30/2019   Procedure: CERVICAL THREE-FOUR ANTERIOR CERVICAL DECOMPRESSION/FUSION;  Surgeon: Eustace Moore, MD;  Location: [place];  Service: Neurosurgery;  Laterality: N/A;   COLONOSCOPY     KNEE ARTHROSCOPY Left    Patient Active Problem List   Diagnosis Date Noted   COPD with asthma ([place]) 08/15/2021   Chronic bilateral low back pain without sciatica 08/15/2021   Need for prophylactic vaccination and inoculation against varicella 08/15/2021   Benign prostatic hyperplasia without lower urinary tract symptoms 10/03/2020   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   Diuretic-induced hypokalemia 11/24/2017   Chronic eczema of hand 08/11/2017   Hyperlipidemia LDL goal <70 07/01/2017   Routine general medical examination at a health care facility 06/30/2017   Essential hypertension 06/30/2017   Vitamin D deficiency 06/30/2017    PCP: Janith Lima, MD   REFERRING PROVIDER: Janith Lima, MD   REFERRING DIAG: Chronic bilateral low back pain without sciatica  Rationale for Evaluation and Treatment Rehabilitation  THERAPY DIAG:  Chronic  bilateral low back pain without sciatica  Joint stiffness  Muscle weakness (generalized)  ONSET DATE: 05/10/2019 (approximate date)- ACDF 08/30/19.    SUBJECTIVE:                                                                                                                                                                                           SUBJECTIVE STATEMENT: 11/27/21:  Pt. Reports no new complaints.  No c/o back pain prior to PT tx. Session.      PERTINENT HISTORY:  ACDF, hx of knee  scope on L knee.  Still utilizes knee brace. Hx of chronic ankle rolling bilaterally.   PAIN:  Are you having pain? Yes: NPRS scale: 0-2/10 Pain location: Low back Pain description: dull achy Aggravating factors: Prolonged standing at sink and carrying laundry Relieving factors: sitting, tylenol 650 mg 2 in morning   PRECAUTIONS: None  WEIGHT BEARING RESTRICTIONS No  FALLS:  Has patient fallen in last 6 months? 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    OBJECTIVE:   PATIENT SURVEYS:  FOTO initial 44/ goal 61 10/16/21: 75   SCREENING FOR RED FLAGS: Bowel or bladder incontinence: Yes: Hard to hold urine Spinal tumors: No Cauda equina syndrome: No Compression fracture: No Abdominal aneurysm: No  COGNITION:  Overall cognitive status: Within functional limits for tasks assessed     SENSATION: Light touch: Impaired  on L LE  POSTURE: rounded shoulders  LOWER/UPPER EXTREMITY ROM:     Limited L/R SLR in supine position  secondary to muscle weakness.  B shoulder and knee/LE AROM WFL  LOWER/UPPER EXTREMITY MMT:       Difficulty lifting up R hip (flexion).    MMT Right eval Left eval  Hip flexion 3/5 3/5  Hip extension 4/5 4/5  Hip abduction 4+/5 4+/5  Hip adduction 4+/5 4+/5  Hip internal rotation 4/5 5/5  Hip external rotation 4/5 5/5  Knee flexion 4/5 4/5  Knee extension 4-/5 4-/5  Ankle dorsiflexion 4/5 4/5  Ankle plantarflexion 4/5 4/5  Ankle inversion    Ankle eversion     (Blank rows = not tested)    B shoulder strength grossly: flexion 4/5 MMT, abduction 3/5, extension 4/5, bicep 5/5, tricep 5/5, sh. IR 4/5, ER 5/5 MMT.  Grip L 72#/ R 70#.       GAIT: Distance walked: in clinic (decrease hip flexion/ step length).   Assistive device utilized: Single point cane Level of assistance: Modified independence   TX:   11/27/21:  There.ex:  Nustep L6 for 10 minutes B UE/LE's for warm up and LE strengthening maintaining >100 SPM.    Forward/ lateral walking in //-bars (high marching)- 6 laps.  No UE assist.    BOSU step ups/ downs (forward/lateral)- 10x each with light to no UE assist for safety/ mirror feedback 10x each.    Resisted gait 2BTB 5x all 4-planes (walking lunges in //-bars with holds as tolerated.    Walking in clinic/ outside with focus on hip/knee flexion and step pattern.  Pt. Working on heel strike/midstance/toe off, esp. When changing thresholds/ walking in grass.  Walking up/down curbs and grass/ mulch outside.    Discussed HEP      Access Code: [email or web address] URL: [email or web address] Date: 10/23/2021 Prepared by: [place] with Counter Support  - 1 x daily - 7 x weekly - 3 sets - 10 reps - Lunge with Counter Support  - 1 x daily - 7 x weekly - 3 sets - 6 reps - Standing Single Leg Stance with Counter  Support  - 1 x daily - 7 x weekly - 2 sets - 3 reps - 30 hold - Seated Hip Flexion March with Ankle Weights  - 1 x daily  - 7 x weekly - 3 sets - 10 reps - Seated Long Arc Quad with Ankle Weight  - 1 x daily - 7 x weekly - 3 sets - 10 reps - Side Stepping with Resistance at Ankles and Counter Support  - 1 x daily - 7 x weekly - 3 sets - 10 reps    PATIENT EDUCATION:  Education details: Updated HEP. Access Code: [email or web address] Person educated: Patient Education method: Explanation, Demonstration, and Handouts Education comprehension: verbalized understanding and returned demonstration   HOME EXERCISE PROGRAM: Access Code: [facility] URL: [email or web address] Date: 09/13/2021 Prepared by: Dorcas Carrow  Exercises - Standing March with Unilateral Counter Support  - 1 x daily - 7 x weekly - 1 sets - 20 reps - Sit to Stand Without Arm Support  - 1 x daily - 7 x weekly - 1 sets - 20 reps - Supine Active Straight Leg Raise  - 1 x daily - 7 x weekly - 1 sets - 20 reps - Supine March  - 1 x daily - 7 x weekly - 1 sets - 20 reps - Seated Long Arc Quad  - 1 x daily - 7 x weekly - 1 sets - 20 reps  ASSESSMENT:  CLINICAL IMPRESSION: Pt. Reports no c/o back pain during tx. Session and works hard with LE strengthening.  Pt. tolerates progression of strengthening ex. per POC without pain.  Pt. Requires decrease UE assist with step ups/ touches.  Pt. Requires use of SPC with 2-point gait pattern to increase hip flexion/ heel strike, esp. On R.  Fatigue noted in B hips at end of tx. Session.   Pt will continue to benefit from skilled PT services to progress strength and balance to reduce risk of falls.     OBJECTIVE IMPAIRMENTS Abnormal gait, decreased activity tolerance, decreased balance, decreased endurance, decreased mobility, difficulty walking, decreased ROM, decreased strength, impaired flexibility, improper body mechanics, postural dysfunction, and pain.   ACTIVITY LIMITATIONS carrying, lifting, bending, standing, squatting, stairs, transfers, and locomotion level  PARTICIPATION LIMITATIONS: cleaning,  community activity, occupation, and yard work  PERSONAL FACTORS Fitness, Past/current experiences, and Profession are also affecting patient's functional outcome.   REHAB POTENTIAL: Good  CLINICAL DECISION MAKING: Evolving/moderate complexity  EVALUATION COMPLEXITY: Moderate   GOALS: Goals reviewed with patient? Yes  SHORT TERM GOALS: Target date: 11/27/21  Pt. Independent with HEP to increase B LE strength 1/2 muscle grade to improve standing/ independence with gait.   Baseline:  see above Goal status: Partially met - MMT - 11/13/21 R hip flexion 4/5, L hip flexion 4+/5.  B quads/ hamstring 5/5 MMT.     LONG TERM GOALS: Target date: 12/11/21  Pt. Will increase FOTO to 58 to improve functional mobility.   Baseline: Initial FOTO 44 ; 53 on 10/16/21 Goal status: IN PROGRESS  2.  Pt. Will report no low back pain consistently for 1 week with all work-related/ household tasks.   Baseline: increase back pain with increase activity.  10/16/21: Pt only experiences pain with heavy lifting and prolonged standing  Goal status: Partially met  3.  Pt. Will ambulate with more normalized gait pattern with consistent hip flexion/ step length with least assistive device safely.   Baseline: R foot lag - decreased step length  Goal status: Partially met  PLAN: PT FREQUENCY: 2x/week  PT DURATION: 8 weeks  PLANNED INTERVENTIONS: Therapeutic exercises, Therapeutic activity, Neuromuscular re-education, Balance training, Gait training, Patient/Family education, Joint mobilization, Stair training, Spinal mobilization, Cryotherapy, Moist heat, and Manual therapy.  PLAN FOR NEXT SESSION: Progress HEP, gait and stair training. Motor planning.   CHECK FOTO  Pura Spice, PT, DPT # [phone] 11/27/2021, 8:27 PM

## 2021-11-29 ENCOUNTER — Ambulatory Visit: Payer: Medicare Other | Admitting: Physical Therapy

## 2021-11-29 ENCOUNTER — Encounter: Payer: Self-pay | Admitting: Physical Therapy

## 2021-11-29 DIAGNOSIS — M545 Low back pain, unspecified: Secondary | ICD-10-CM | POA: Diagnosis not present

## 2021-11-29 DIAGNOSIS — M2560 Stiffness of unspecified joint, not elsewhere classified: Secondary | ICD-10-CM

## 2021-11-29 DIAGNOSIS — G8929 Other chronic pain: Secondary | ICD-10-CM

## 2021-11-29 DIAGNOSIS — M6281 Muscle weakness (generalized): Secondary | ICD-10-CM | POA: Diagnosis not present

## 2021-11-29 NOTE — Therapy (Signed)
OUTPATIENT PHYSICAL THERAPY THORACOLUMBAR TREATMENT  Patient Name: Larry Blair MRN: [id number] DOB:[birth year], 66 y.o., male Today's Date: 11/29/2021   PT End of Session - 11/29/21 1345     Visit Number 21    Number of Visits 25    Date for PT Re-Evaluation 12/11/21    PT Start Time 1342    PT Stop Time 1433    PT Time Calculation (min) 51 min    Activity Tolerance Patient tolerated treatment well    Behavior During Therapy [facility] for tasks assessed/performed              Past Medical History:  Diagnosis Date   Allergy    Arthritis    right shoulder   Asthma    Eczema    HTN (hypertension)    Hyperlipidemia    Substance abuse ([place]) 1976   daily   Past Surgical History:  Procedure Laterality Date   ANTERIOR CERVICAL DECOMP/DISCECTOMY FUSION N/A 08/30/2019   Procedure: CERVICAL THREE-FOUR ANTERIOR CERVICAL DECOMPRESSION/FUSION;  Surgeon: Eustace Moore, MD;  Location: [place];  Service: Neurosurgery;  Laterality: N/A;   COLONOSCOPY     KNEE ARTHROSCOPY Left    Patient Active Problem List   Diagnosis Date Noted   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/15/2021   Chronic bilateral low back pain without sciatica 08/15/2021   Need for prophylactic vaccination and inoculation against varicella 08/15/2021   Benign prostatic hyperplasia without lower urinary tract symptoms 10/03/2020   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   Diuretic-induced hypokalemia 11/24/2017   Chronic eczema of hand 08/11/2017   Hyperlipidemia LDL goal <70 07/01/2017   Routine general medical examination at a health care facility 06/30/2017   Essential hypertension 06/30/2017   Vitamin D deficiency 06/30/2017    PCP: Janith Lima, MD   REFERRING PROVIDER: Janith Lima, MD   REFERRING DIAG: Chronic bilateral low back pain without sciatica  Rationale for Evaluation and Treatment Rehabilitation  THERAPY DIAG:  Chronic bilateral low back pain without sciatica  Joint stiffness  Muscle weakness  (generalized)  ONSET DATE: 05/10/2019 (approximate date)- ACDF 08/30/19.    SUBJECTIVE:                                                                                                                                                                                           SUBJECTIVE STATEMENT: 11/29/21:  Pt. Reports no new complaints.  No c/o back pain prior to PT tx. Session.  PT discussed work related tasks over past week.    PERTINENT HISTORY:  ACDF, hx of knee scope on L knee.  Still utilizes knee brace. Hx of  chronic ankle rolling bilaterally.   PAIN:  Are you having pain? Yes: NPRS scale: 0/10 Pain location: Low back Pain description: dull achy Aggravating factors: Prolonged standing at sink and carrying laundry Relieving factors: sitting, tylenol 650 mg 2 in morning   PRECAUTIONS: None  WEIGHT BEARING RESTRICTIONS No  FALLS:  Has patient fallen in last 6 months? 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    OBJECTIVE:   PATIENT SURVEYS:  FOTO initial 44/ goal 80 10/16/21: 37   SCREENING FOR RED FLAGS: Bowel or bladder incontinence: Yes: Hard to hold urine Spinal tumors: No Cauda equina syndrome: No Compression fracture: No Abdominal aneurysm: No  COGNITION:  Overall cognitive status: Within functional limits for tasks assessed     SENSATION: Light touch: Impaired  on L LE  POSTURE: rounded shoulders  LOWER/UPPER EXTREMITY ROM:     Limited L/R SLR in supine position secondary to muscle weakness.  B  shoulder and knee/LE AROM WFL  LOWER/UPPER EXTREMITY MMT:       Difficulty lifting up R hip (flexion).    MMT Right eval Left eval  Hip flexion 3/5 3/5  Hip extension 4/5 4/5  Hip abduction 4+/5 4+/5  Hip adduction 4+/5 4+/5  Hip internal rotation 4/5 5/5  Hip external rotation 4/5 5/5  Knee flexion 4/5 4/5  Knee extension 4-/5 4-/5  Ankle dorsiflexion 4/5 4/5  Ankle plantarflexion 4/5 4/5  Ankle inversion    Ankle eversion     (Blank rows = not tested)    B shoulder strength grossly: flexion 4/5 MMT, abduction 3/5, extension 4/5, bicep 5/5, tricep 5/5, sh. IR 4/5, ER 5/5 MMT.  Grip L 72#/ R 70#.       GAIT: Distance walked: in clinic (decrease hip flexion/ step length).   Assistive device utilized: Single point cane Level of assistance: Modified independence   Treatment:   11/29/21:  There.ex:  Nustep L6 for 10 minutes B UE/LE's for warm up and LE strengthening maintaining >100 SPM.    Squating with single leg on BOSU and return to standing 5x on L/R.  Progressing from light UE assist to no UE assist at //-bars.  No pain.    Resisted gait 2BTB 5x all 4-planes.  Cuing to increase BOS and consistent heel strike with forward walking and toe strike with backwards walking.  BOSU step ups/ downs (forward)- 8x each with light to no UE assist for safety/ mirror feedback.     Walking in hallway/ added 6" hurdle step overs.  2 laps.  Pt. Unable to clear 12" step overs in hallway.  Limited hip flexion.      Walking in clinic/ outside with focus on hip/knee flexion and step pattern.  Pt. Working on heel strike/midstance/toe off, esp. When changing thresholds/ walking in grass.  Walking up/down curbs and grass/ mulch outside.         Access Code: [facility] URL: [email or web address] Date: 10/23/2021 Prepared by: [place] with Counter Support  - 1  x daily - 7 x weekly - 3 sets - 10 reps - Lunge with Counter Support  - 1 x daily  - 7 x weekly - 3 sets - 6 reps - Standing Single Leg Stance with Counter Support  - 1 x daily - 7 x weekly - 2 sets - 3 reps - 30 hold - Seated Hip Flexion March with Ankle Weights  - 1 x daily - 7 x weekly - 3 sets - 10 reps - Seated Long Arc Quad with Ankle Weight  - 1 x daily - 7 x weekly - 3 sets - 10 reps - Side Stepping with Resistance at Ankles and Counter Support  - 1 x daily - 7 x weekly - 3 sets - 10 reps    PATIENT EDUCATION:  Education details: Updated HEP. Access Code: [email or web address] Person educated: Patient Education method: Explanation, Demonstration, and Handouts Education comprehension: verbalized understanding and returned demonstration   HOME EXERCISE PROGRAM: Access Code: [facility] URL: [email or web address] Date: 09/13/2021 Prepared by: Dorcas Carrow  Exercises - Standing March with Unilateral Counter Support  - 1 x daily - 7 x weekly - 1 sets - 20 reps - Sit to Stand Without Arm Support  - 1 x daily - 7 x weekly - 1 sets - 20 reps - Supine Active Straight Leg Raise  - 1 x daily - 7 x weekly - 1 sets - 20 reps - Supine March  - 1 x daily - 7 x weekly - 1 sets - 20 reps - Seated Long Arc Quad  - 1 x daily - 7 x weekly - 1 sets - 20 reps  ASSESSMENT:  CLINICAL IMPRESSION: Pt. Reports no c/o back pain during tx. Session and works hard with LE strengthening.  Pt. tolerates progression of strengthening ex. per POC without pain.  Pt. Requires decrease UE assist with step ups/ touches.  Pt. Requires use of SPC with 2-point gait pattern to increase hip flexion/ heel strike, esp. On R.  Fatigue noted in B hips at end of tx. Session.   Pt will continue to benefit from skilled PT services to progress strength and balance to reduce risk of falls.     OBJECTIVE IMPAIRMENTS Abnormal gait, decreased activity tolerance, decreased balance, decreased endurance, decreased mobility, difficulty walking, decreased ROM, decreased strength, impaired flexibility, improper body  mechanics, postural dysfunction, and pain.   ACTIVITY LIMITATIONS carrying, lifting, bending, standing, squatting, stairs, transfers, and locomotion level  PARTICIPATION LIMITATIONS: cleaning, community activity, occupation, and yard work  PERSONAL FACTORS Fitness, Past/current experiences, and Profession are also affecting patient's functional outcome.   REHAB POTENTIAL: Good  CLINICAL DECISION MAKING: Evolving/moderate complexity  EVALUATION COMPLEXITY: Moderate   GOALS: Goals reviewed with patient? Yes  SHORT TERM GOALS: Target date: 11/27/21  Pt. Independent with HEP to increase B LE strength 1/2 muscle grade to improve standing/ independence with gait.   Baseline:  see above Goal status: Partially met - MMT - 11/13/21 R hip flexion 4/5, L hip flexion 4+/5.  B quads/ hamstring 5/5 MMT.     LONG TERM GOALS: Target date: 12/11/21  Pt. Will increase FOTO to 58 to improve functional mobility.   Baseline: Initial FOTO 44 ; 53 on 10/16/21 Goal status: IN PROGRESS  2.  Pt. Will report no low back pain consistently for 1 week with all work-related/ household tasks.   Baseline: increase back pain with increase activity.  10/16/21: Pt only experiences pain with heavy lifting and prolonged standing  Goal status: Partially met  3.  Pt. Will ambulate with more normalized gait pattern with consistent hip flexion/ step length with least assistive device safely.   Baseline: R foot lag - decreased step length  Goal status: Partially met   PLAN: PT FREQUENCY: 2x/week  PT DURATION: 8 weeks  PLANNED INTERVENTIONS: Therapeutic exercises, Therapeutic activity, Neuromuscular re-education, Balance training, Gait training, Patient/Family education, Joint mobilization, Stair training, Spinal mobilization, Cryotherapy, Moist heat, and Manual therapy.  PLAN FOR NEXT SESSION: Progress HEP, gait and stair training. Motor planning.   CHECK FOTO/ RECERT and decrease tx. Frequency to 1x/week with HEP  focus.   Pura Spice, PT, DPT # [phone] 11/30/2021, 7:46 PM

## 2021-12-07 ENCOUNTER — Ambulatory Visit: Payer: Medicare Other | Attending: Internal Medicine | Admitting: Physical Therapy

## 2021-12-07 ENCOUNTER — Encounter: Payer: Self-pay | Admitting: Physical Therapy

## 2021-12-07 DIAGNOSIS — M2560 Stiffness of unspecified joint, not elsewhere classified: Secondary | ICD-10-CM | POA: Insufficient documentation

## 2021-12-07 DIAGNOSIS — G8929 Other chronic pain: Secondary | ICD-10-CM | POA: Diagnosis not present

## 2021-12-07 DIAGNOSIS — M545 Low back pain, unspecified: Secondary | ICD-10-CM | POA: Diagnosis not present

## 2021-12-07 DIAGNOSIS — M6281 Muscle weakness (generalized): Secondary | ICD-10-CM | POA: Diagnosis not present

## 2021-12-07 NOTE — Therapy (Signed)
OUTPATIENT PHYSICAL THERAPY THORACOLUMBAR TREATMENT  Patient Name: Larry Blair MRN: [id number] DOB:1956-02-14, 66 y.o., male Today's Date: 12/07/2021   PT End of Session - 12/07/21 1316     Visit Number 22    Number of Visits 25    Date for PT Re-Evaluation 12/11/21    PT Start Time 8882    PT Stop Time 1430    PT Time Calculation (min) 53 min    Activity Tolerance Patient tolerated treatment well    Behavior During Therapy [facility] for tasks assessed/performed              Past Medical History:  Diagnosis Date   Allergy    Arthritis    right shoulder   Asthma    Eczema    HTN (hypertension)    Hyperlipidemia    Substance abuse ([place]) 1976   daily   Past Surgical History:  Procedure Laterality Date   ANTERIOR CERVICAL DECOMP/DISCECTOMY FUSION N/A 08/30/2019   Procedure: CERVICAL THREE-FOUR ANTERIOR CERVICAL DECOMPRESSION/FUSION;  Surgeon: Eustace Moore, MD;  Location: [facility];  Service: Neurosurgery;  Laterality: N/A;   COLONOSCOPY     KNEE ARTHROSCOPY Left    Patient Active Problem List   Diagnosis Date Noted   COPD with asthma ([place]) 08/15/2021   Chronic bilateral low back pain without sciatica 08/15/2021   Need for prophylactic vaccination and inoculation against varicella 08/15/2021   Benign prostatic hyperplasia without lower urinary tract symptoms 10/03/2020   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   Diuretic-induced hypokalemia 11/24/2017   Chronic eczema of hand 08/11/2017   Hyperlipidemia LDL goal <70 07/01/2017   Routine general medical examination at a health care facility 06/30/2017   Essential hypertension 06/30/2017   Vitamin D deficiency 06/30/2017    PCP: Janith Lima, MD   REFERRING PROVIDER: Janith Lima, MD   REFERRING DIAG: Chronic bilateral low back pain without sciatica  Rationale for Evaluation and Treatment Rehabilitation  THERAPY DIAG:  Chronic bilateral low back pain without sciatica  Joint stiffness  Muscle weakness  (generalized)  ONSET DATE: 05/10/2019 (approximate date)- ACDF 08/30/19.    SUBJECTIVE:                                                                                                                                                                                           SUBJECTIVE STATEMENT: 12/07/21:  Pt. Reports no new complaints.  No c/o back pain prior to PT tx. Session.  Pt. Reports no big plans for Labor Day weekend.     PERTINENT HISTORY:  ACDF, hx of knee scope on L knee.  Still utilizes knee brace.  Hx of chronic ankle rolling bilaterally.   PAIN:  Are you having pain? Yes: NPRS scale: 0/10 Pain location: Low back Pain description: dull achy Aggravating factors: Prolonged standing at sink and carrying laundry Relieving factors: sitting, tylenol 650 mg 2 in morning   PRECAUTIONS: None  WEIGHT BEARING RESTRICTIONS No  FALLS:  Has patient fallen in last 6 months? 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    OBJECTIVE:   PATIENT SURVEYS:  FOTO initial 44/ goal 58 10/16/21: 11, 9/1: 8  SCREENING FOR RED FLAGS: Bowel or bladder incontinence: Yes: Hard to hold urine Spinal tumors: No Cauda equina syndrome: No Compression fracture: No Abdominal aneurysm: No  COGNITION:  Overall cognitive status: Within functional limits for tasks assessed     SENSATION: Light touch: Impaired  on L LE  POSTURE: rounded shoulders  LOWER/UPPER EXTREMITY ROM:     Limited L/R SLR in supine position secondary to muscle  weakness.  B shoulder and knee/LE AROM WFL  LOWER/UPPER EXTREMITY MMT:       Difficulty lifting up R hip (flexion).    MMT Right eval Left eval  Hip flexion 3/5 3/5  Hip extension 4/5 4/5  Hip abduction 4+/5 4+/5  Hip adduction 4+/5 4+/5  Hip internal rotation 4/5 5/5  Hip external rotation 4/5 5/5  Knee flexion 4/5 4/5  Knee extension 4-/5 4-/5  Ankle dorsiflexion 4/5 4/5  Ankle plantarflexion 4/5 4/5  Ankle inversion    Ankle eversion     (Blank rows = not tested)    B shoulder strength grossly: flexion 4/5 MMT, abduction 3/5, extension 4/5, bicep 5/5, tricep 5/5, sh. IR 4/5, ER 5/5 MMT.  Grip L 72#/ R 70#.       GAIT: Distance walked: in clinic (decrease hip flexion/ step length).   Assistive device utilized: Single point cane Level of assistance: Modified independence   Treatment:   12/07/21:  There.ex:  Nustep L6 for 10 minutes B LE's for warm up and LE strengthening maintaining >100 SPM.  Completed FOTO: 67 (marked improvement)- limited with heavy activities.  Extra time with walking/ stairs due to hip flexion limitations.    Sled push/pull: 40# 20 feet x 3 in gym.     1/2 bolster standing in //-bars (wt. Shifting)- light to no UE assist (SBA/CGA for safety and verbal cuing).  Good ankle control noted.  Pharmacist, hospital.    BOSU step ups/ downs (forward)- 10x each with light to no UE assist for safety/ mirror feedback.   Resisted gait 2BTB 5x all 4-planes.  Cuing to increase BOS and consistent heel strike with forward walking and toe strike with backwards walking.  Agility ladder with coordination stepping in/out and SBA/CGA for safety and cuing.    Nautilus ex.: 50# lat. Pull downs in seated position 20x/ 50# scapular retraction in standing 20x.    Walking in clinic/ outside with focus on hip/knee flexion and step pattern.  Pt. Working on heel strike/midstance/toe off, esp. When changing thresholds/ walking in grass.  Walking up/down curbs and grass/ mulch  outside.      Access Code: [facility] URL: [email or web address] Date: 10/23/2021 Prepared by: [place] with Counter Support  - 1 x daily - 7 x weekly - 3 sets - 10 reps - Lunge with Counter Support  - 1 x daily - 7 x weekly - 3 sets - 6 reps - Standing Single Leg Stance with Counter Support  - 1 x daily - 7 x weekly - 2 sets - 3 reps - 30 hold - Seated Hip Flexion March with Ankle Weights  - 1 x daily - 7 x weekly - 3 sets - 10 reps - Seated Long Arc Quad with Ankle Weight  - 1 x daily - 7 x weekly - 3 sets - 10 reps - Side Stepping with Resistance at Ankles and Counter Support  - 1 x daily - 7 x weekly - 3 sets - 10 reps    PATIENT EDUCATION:  Education details: Updated HEP. Access Code: [email or web address] Person educated: Patient Education method: Explanation, Demonstration, and Handouts Education comprehension: verbalized understanding and returned demonstration   HOME EXERCISE PROGRAM: Access Code: [facility] URL: [email or web address] Date: 09/13/2021 Prepared by: Dorcas Carrow  Exercises - Standing March with Unilateral Counter Support  - 1 x daily - 7 x weekly - 1 sets - 20 reps - Sit to Stand Without Arm Support  - 1 x daily - 7 x weekly - 1 sets - 20 reps - Supine Active Straight Leg Raise  - 1 x daily - 7 x weekly - 1 sets - 20 reps - Supine March  - 1 x daily - 7 x weekly - 1 sets - 20 reps - Seated Long Arc Quad  - 1 x daily - 7 x weekly - 1 sets - 20 reps  ASSESSMENT:  CLINICAL IMPRESSION: Pt. Reports no c/o back pain during tx. Session and works hard with LE strengthening.  Pt. tolerates progression of strengthening ex. per POC without pain.  Pt. Able to complete BOSU/ 1/2 bolster ex. With little to no UE assist in //-bars.  Extra time/ focus with agility ladder ex and cuing for core muscle activation during Nautilus ex.  Marked increase in FOTO (see above).  Pt. Requires use of SPC with 2-point gait pattern to  increase hip flexion/ heel strike, esp. On R.  Fatigue noted in B hips at end of tx. Session.   Pt will continue to benefit from skilled PT services to progress strength and balance to reduce risk of falls.     OBJECTIVE IMPAIRMENTS Abnormal gait, decreased activity tolerance, decreased balance, decreased endurance, decreased mobility, difficulty walking, decreased ROM, decreased strength, impaired flexibility, improper body mechanics, postural dysfunction, and pain.   ACTIVITY LIMITATIONS carrying, lifting, bending, standing, squatting, stairs, transfers, and locomotion level  PARTICIPATION LIMITATIONS: cleaning, community activity, occupation, and yard work  PERSONAL FACTORS Fitness, Past/current experiences, and Profession are also affecting patient's functional outcome.   REHAB POTENTIAL: Good  CLINICAL DECISION MAKING: Evolving/moderate complexity  EVALUATION COMPLEXITY: Moderate   GOALS: Goals reviewed with patient? Yes  SHORT TERM GOALS: Target date: 11/27/21  Pt. Independent with HEP to increase B LE strength 1/2 muscle grade to improve standing/ independence with gait.   Baseline:  see above Goal status: Partially met - MMT - 11/13/21 R hip flexion 4/5, L hip flexion 4+/5.  B quads/ hamstring 5/5 MMT.     LONG TERM GOALS: Target date: 12/11/21  Pt. Will increase FOTO  to 58 to improve functional mobility.   Baseline: Initial FOTO 44 ; 53 on 10/16/21.  9/1: 67 goal met on 9/1 Goal status: Goal met  2.  Pt. Will report no low back pain consistently for 1 week with all work-related/ household tasks.   Baseline: increase back pain with increase activity.  10/16/21: Pt only experiences pain with heavy lifting and prolonged standing  Goal status: Partially met  3.  Pt. Will ambulate with more normalized gait pattern with consistent hip flexion/ step length with least assistive device safely.   Baseline: R foot lag - decreased step length  Goal status: Partially met   PLAN: PT  FREQUENCY: 2x/week  PT DURATION: 8 weeks  PLANNED INTERVENTIONS: Therapeutic exercises, Therapeutic activity, Neuromuscular re-education, Balance training, Gait training, Patient/Family education, Joint mobilization, Stair training, Spinal mobilization, Cryotherapy, Moist heat, and Manual therapy.  PLAN FOR NEXT SESSION: Progress HEP, gait and stair training. Motor planning.     Pura Spice, PT, DPT # [phone] 12/07/2021, 3:20 PM

## 2021-12-14 ENCOUNTER — Ambulatory Visit: Payer: Medicare Other | Admitting: Physical Therapy

## 2021-12-14 ENCOUNTER — Encounter: Payer: Self-pay | Admitting: Physical Therapy

## 2021-12-14 DIAGNOSIS — M545 Low back pain, unspecified: Secondary | ICD-10-CM | POA: Diagnosis not present

## 2021-12-14 DIAGNOSIS — M2560 Stiffness of unspecified joint, not elsewhere classified: Secondary | ICD-10-CM | POA: Diagnosis not present

## 2021-12-14 DIAGNOSIS — M6281 Muscle weakness (generalized): Secondary | ICD-10-CM

## 2021-12-14 DIAGNOSIS — G8929 Other chronic pain: Secondary | ICD-10-CM | POA: Diagnosis not present

## 2021-12-14 NOTE — Therapy (Signed)
OUTPATIENT PHYSICAL THERAPY THORACOLUMBAR TREATMENT/ RECERTIFICATION  Patient Name: Larry Blair MRN: [id number] DOB:20-Jun-1955, 66 y.o., male Today's Date: 12/14/2021   PT End of Session - 12/14/21 1109     Visit Number 23    Number of Visits 31    Date for PT Re-Evaluation 02/08/22    PT Start Time 1103    PT Stop Time 6967    PT Time Calculation (min) 53 min    Activity Tolerance Patient tolerated treatment well    Behavior During Therapy [facility] for tasks assessed/performed              Past Medical History:  Diagnosis Date   Allergy    Arthritis    right shoulder   Asthma    Eczema    HTN (hypertension)    Hyperlipidemia    Substance abuse ([place]) 1976   daily   Past Surgical History:  Procedure Laterality Date   ANTERIOR CERVICAL DECOMP/DISCECTOMY FUSION N/A 08/30/2019   Procedure: CERVICAL THREE-FOUR ANTERIOR CERVICAL DECOMPRESSION/FUSION;  Surgeon: Eustace Moore, MD;  Location: [facility];  Service: Neurosurgery;  Laterality: N/A;   COLONOSCOPY     KNEE ARTHROSCOPY Left    Patient Active Problem List   Diagnosis Date Noted   COPD with asthma ([place]) 08/15/2021   Chronic bilateral low back pain without sciatica 08/15/2021   Need for prophylactic vaccination and inoculation against varicella 08/15/2021   Benign prostatic hyperplasia without lower urinary tract symptoms 10/03/2020   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   Diuretic-induced hypokalemia 11/24/2017   Chronic eczema of hand 08/11/2017   Hyperlipidemia LDL goal <70 07/01/2017   Routine general medical examination at a health care facility 06/30/2017   Essential hypertension 06/30/2017   Vitamin D deficiency 06/30/2017    PCP: Janith Lima, MD   REFERRING PROVIDER: Janith Lima, MD   REFERRING DIAG: Chronic bilateral low back pain without sciatica  Rationale for Evaluation and Treatment Rehabilitation  THERAPY DIAG:  Chronic bilateral low back pain without sciatica  Joint stiffness  Muscle  weakness (generalized)  ONSET DATE: 05/10/2019 (approximate date)- ACDF 08/30/19.    SUBJECTIVE:                                                                                                                                                                                           SUBJECTIVE STATEMENT: 12/14/21:  Pt. Had a fall while walking into [place] and lifting R LE onto curb (caught toe on curb)- no injury and able to return to stand independently.  Minimal low back symptoms today.     PERTINENT HISTORY:  ACDF, hx of  knee scope on L knee.  Still utilizes knee brace. Hx of chronic ankle rolling bilaterally.   PAIN:  Are you having pain? Yes: NPRS scale: 0/10 Pain location: Low back Pain description: dull achy Aggravating factors: Prolonged standing at sink and carrying laundry Relieving factors: sitting, tylenol 650 mg 2 in morning   PRECAUTIONS: None  WEIGHT BEARING RESTRICTIONS No  FALLS:  Has patient fallen in last 6 months? 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    OBJECTIVE:   PATIENT SURVEYS:  FOTO initial 44/ goal 58 10/16/21: 54, 9/1: 31  SCREENING FOR RED FLAGS: Bowel or bladder incontinence: Yes: Hard to hold urine Spinal tumors: No Cauda equina syndrome: No Compression fracture: No Abdominal aneurysm: No  COGNITION:  Overall cognitive status: Within functional limits for tasks assessed     SENSATION: Light touch: Impaired  on L LE  POSTURE: rounded shoulders  LOWER/UPPER EXTREMITY ROM:      Limited L/R SLR in supine position secondary to muscle weakness.  B shoulder and knee/LE AROM WFL  LOWER/UPPER EXTREMITY MMT:       Difficulty lifting up R hip (flexion).    MMT Right eval Left eval  Hip flexion 3/5 3/5  Hip extension 4/5 4/5  Hip abduction 4+/5 4+/5  Hip adduction 4+/5 4+/5  Hip internal rotation 4/5 5/5  Hip external rotation 4/5 5/5  Knee flexion 4/5 4/5  Knee extension 4-/5 4-/5  Ankle dorsiflexion 4/5 4/5  Ankle plantarflexion 4/5 4/5  Ankle inversion    Ankle eversion     (Blank rows = not tested)    B shoulder strength grossly: flexion 4/5 MMT, abduction 3/5, extension 4/5, bicep 5/5, tricep 5/5, sh. IR 4/5, ER 5/5 MMT.  Grip L 72#/ R 70#.       GAIT: Distance walked: in clinic (decrease hip flexion/ step length).   Assistive device utilized: Single point cane Level of assistance: Modified independence   Treatment:   12/14/21:  There.ex:  STS from blue mat table with no UE assist (varying heights) 10x.  Standing lunges in front of blue mat table L/R 10x each (no UE assist).  12" step touches at stairs (recip.)- requires at least 1 UE assist on handrail to complete task/ prevent toe strike on step.  Recip. Stair climbing 6 reps.    Nustep L6 for 10 minutes B LE's for warm up and LE strengthening maintaining >100 SPM.    Agility ladder walking: cone taps/ 6" hurdles/ maneuvering cones (varying spacing)/ picking up cones with no assistive device.  SBA/CGA for safety with gait belt.  Agility ladder with coordination stepping in/out and SBA/CGA for safety and cuing.  Cuing to prevent NBOS.    Nautilus ex.: resisted gait 50# 4x all 4-planes (no UE assist).  50# lat. Pull downs in standing position 20x/ 50# scapular retraction in standing 20x.    Walking in clinic/ outside with focus on hip/knee flexion and step pattern.  Pt. Working on heel strike/midstance/toe off, esp. When changing thresholds/ walking  in grass.  Walking up/down curbs and grass/  mulch outside.      Access Code: [facility] URL: [email or web address] Date: 10/23/2021 Prepared by: [place] with Counter Support  - 1 x daily - 7 x weekly - 3 sets - 10 reps - Lunge with Counter Support  - 1 x daily - 7 x weekly - 3 sets - 6 reps - Standing Single Leg Stance with Counter Support  - 1 x daily - 7 x weekly - 2 sets - 3 reps - 30 hold - Seated Hip Flexion March with Ankle Weights  - 1 x daily - 7 x weekly - 3 sets - 10 reps - Seated Long Arc Quad with Ankle Weight  - 1 x daily - 7 x weekly - 3 sets - 10 reps - Side Stepping with Resistance at Ankles and Counter Support  - 1 x daily - 7 x weekly - 3 sets - 10 reps    PATIENT EDUCATION:  Education details: Updated HEP. Access Code: [email or web address] Person educated: Patient Education method: Explanation, Demonstration, and Handouts Education comprehension: verbalized understanding and returned demonstration   HOME EXERCISE PROGRAM: Access Code: [facility] URL: [email or web address] Date: 09/13/2021 Prepared by: Dorcas Carrow  Exercises - Standing March with Unilateral Counter Support  - 1 x daily - 7 x weekly - 1 sets - 20 reps - Sit to Stand Without Arm Support  - 1 x daily - 7 x weekly - 1 sets - 20 reps - Supine Active Straight Leg Raise  - 1 x daily - 7 x weekly - 1 sets - 20 reps - Supine March  - 1 x daily - 7 x weekly - 1 sets - 20 reps - Seated Long Arc Quad  - 1 x daily - 7 x weekly - 1 sets - 20 reps  ASSESSMENT:  CLINICAL IMPRESSION: Pt. Reports no c/o back pain during tx. Session and works hard with LE strengthening.  Pt. Had a recent fall this week when stepping onto a curb while walking into [employer] [place].  Pt. Reports no injury and able to return to standing independently.  Pt. tolerates progression of strengthening ex. per POC without pain.  Extra time/ focus with agility ladder ex and cuing for core muscle activation during Nautilus ex.  Marked increase in  FOTO (see above).  Pt. Requires use of SPC with 2-point gait pattern to increase hip flexion/ heel strike, esp. On R.  Fatigue noted in B hips at end of tx. Session.   Pt will continue to benefit from skilled PT services to progress strength and balance to reduce risk of falls.     OBJECTIVE IMPAIRMENTS Abnormal gait, decreased activity tolerance, decreased balance, decreased endurance, decreased mobility, difficulty walking, decreased ROM, decreased strength, impaired flexibility, improper body mechanics, postural dysfunction, and pain.   ACTIVITY LIMITATIONS carrying, lifting, bending, standing, squatting, stairs, transfers, and locomotion level  PARTICIPATION LIMITATIONS: cleaning, community activity, occupation, and yard work  PERSONAL FACTORS Fitness, Past/current experiences, and Profession are also affecting patient's functional outcome.   REHAB POTENTIAL: Good  CLINICAL DECISION MAKING: Evolving/moderate complexity  EVALUATION COMPLEXITY: Moderate   GOALS: Goals reviewed with patient? Yes  SHORT TERM GOALS: Target date: 01/11/22  Pt. Independent with HEP to increase B LE strength 1/2 muscle grade to improve standing/ independence with gait.   Baseline:  see above Goal status: Partially met - MMT - 11/13/21 R hip flexion 4/5, L hip flexion 4+/5.  B quads/ hamstring 5/5  MMT.     LONG TERM GOALS: Target date: 02/08/22     Pt. Will increase FOTO to 58 to improve functional mobility.   Baseline: Initial FOTO 44 ; 53 on 10/16/21.  9/1: 67 goal met on 9/1 Goal status: Goal met  2.  Pt. Will report no low back pain consistently for 1 week with all work-related/ household tasks.   Baseline: increase back pain with increase activity.  10/16/21: Pt only experiences pain with heavy lifting and prolonged standing  Goal status: Partially met  3.  Pt. Will ambulate with more normalized gait pattern with consistent hip flexion/ step length with least assistive device safely.   Baseline: R  foot lag - decreased step length  Goal status: Partially met   PLAN: PT FREQUENCY: 1-2x/week  PT DURATION: 8 weeks  PLANNED INTERVENTIONS: Therapeutic exercises, Therapeutic activity, Neuromuscular re-education, Balance training, Gait training, Patient/Family education, Joint mobilization, Stair training, Spinal mobilization, Cryotherapy, Moist heat, and Manual therapy.  PLAN FOR NEXT SESSION: Progress HEP, gait and stair training. Motor planning.     Pura Spice, PT, DPT # [id number] 12/15/2021, 2:08 PM

## 2021-12-21 ENCOUNTER — Encounter: Payer: Self-pay | Admitting: Physical Therapy

## 2021-12-21 ENCOUNTER — Ambulatory Visit: Payer: Medicare Other | Admitting: Physical Therapy

## 2021-12-21 DIAGNOSIS — M6281 Muscle weakness (generalized): Secondary | ICD-10-CM

## 2021-12-21 DIAGNOSIS — G8929 Other chronic pain: Secondary | ICD-10-CM | POA: Diagnosis not present

## 2021-12-21 DIAGNOSIS — M2560 Stiffness of unspecified joint, not elsewhere classified: Secondary | ICD-10-CM

## 2021-12-21 DIAGNOSIS — M545 Low back pain, unspecified: Secondary | ICD-10-CM | POA: Diagnosis not present

## 2021-12-21 NOTE — Therapy (Signed)
OUTPATIENT PHYSICAL THERAPY THORACOLUMBAR TREATMENT  Patient Name: Larry Blair MRN: [id number] DOB:1956-04-08, 66 y.o., male Today's Date: 12/21/2021   PT End of Session - 12/21/21 1338     Visit Number 24    Number of Visits 31    Date for PT Re-Evaluation 02/08/22    PT Start Time 1339    Activity Tolerance Patient tolerated treatment well    Behavior During Therapy [facility] for tasks assessed/performed             1339 to 1431 (52 minutes).     Past Medical History:  Diagnosis Date   Allergy    Arthritis    right shoulder   Asthma    Eczema    HTN (hypertension)    Hyperlipidemia    Substance abuse ([place]) 1976   daily   Past Surgical History:  Procedure Laterality Date   ANTERIOR CERVICAL DECOMP/DISCECTOMY FUSION N/A 08/30/2019   Procedure: CERVICAL THREE-FOUR ANTERIOR CERVICAL DECOMPRESSION/FUSION;  Surgeon: Eustace Moore, MD;  Location: [place];  Service: Neurosurgery;  Laterality: N/A;   COLONOSCOPY     KNEE ARTHROSCOPY Left    Patient Active Problem List   Diagnosis Date Noted   COPD with asthma ([place]) 08/15/2021   Chronic bilateral low back pain without sciatica 08/15/2021   Need for prophylactic vaccination and inoculation against varicella 08/15/2021   Benign prostatic hyperplasia without lower urinary tract symptoms 10/03/2020   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   Diuretic-induced hypokalemia 11/24/2017   Chronic eczema of hand 08/11/2017   Hyperlipidemia LDL goal <70 07/01/2017   Routine general medical examination at a health care facility 06/30/2017   Essential hypertension 06/30/2017   Vitamin D deficiency 06/30/2017    PCP: Janith Lima, MD   REFERRING PROVIDER: Janith Lima, MD   REFERRING DIAG: Chronic bilateral low back pain without sciatica  Rationale for Evaluation and Treatment Rehabilitation  THERAPY DIAG:  Chronic bilateral low back pain without sciatica  Joint stiffness  Muscle weakness (generalized)  ONSET DATE:  05/10/2019 (approximate date)- ACDF 08/30/19.    SUBJECTIVE:                                                                                                                                                                                           SUBJECTIVE STATEMENT: 12/21/21:  Pt. Reports an increase in low back/ R hip symptoms the past few days (3-5/10).  Pt. Reports no pain currently on Nustep.  No falls.     PERTINENT HISTORY:  ACDF, hx of knee scope on L knee.  Still utilizes knee brace. Hx of chronic ankle rolling bilaterally.  PAIN:  Are you having pain? Yes: NPRS scale: 0/10 Pain location: Low back Pain description: dull achy Aggravating factors: Prolonged standing at sink and carrying laundry Relieving factors: sitting, tylenol 650 mg 2 in morning   PRECAUTIONS: None  WEIGHT BEARING RESTRICTIONS No  FALLS:  Has patient fallen in last 6 months? 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    OBJECTIVE:   PATIENT SURVEYS:  FOTO initial 44/ goal 58 10/16/21: 82, 9/1: 63  SCREENING FOR RED FLAGS: Bowel or bladder incontinence: Yes: Hard to hold urine Spinal tumors: No Cauda equina syndrome: No Compression fracture: No Abdominal aneurysm: No  COGNITION:  Overall cognitive status: Within functional limits for tasks assessed     SENSATION: Light touch: Impaired  on L LE  POSTURE: rounded shoulders  LOWER/UPPER EXTREMITY ROM:     Limited L/R SLR in supine position secondary to muscle weakness.  B shoulder and  knee/LE AROM WFL  LOWER/UPPER EXTREMITY MMT:       Difficulty lifting up R hip (flexion).    MMT Right eval Left eval  Hip flexion 3/5 3/5  Hip extension 4/5 4/5  Hip abduction 4+/5 4+/5  Hip adduction 4+/5 4+/5  Hip internal rotation 4/5 5/5  Hip external rotation 4/5 5/5  Knee flexion 4/5 4/5  Knee extension 4-/5 4-/5  Ankle dorsiflexion 4/5 4/5  Ankle plantarflexion 4/5 4/5  Ankle inversion    Ankle eversion     (Blank rows = not tested)    B shoulder strength grossly: flexion 4/5 MMT, abduction 3/5, extension 4/5, bicep 5/5, tricep 5/5, sh. IR 4/5, ER 5/5 MMT.  Grip L 72#/ R 70#.       GAIT: Distance walked: in clinic (decrease hip flexion/ step length).   Assistive device utilized: Single point cane Level of assistance: Modified independence   Treatment:   12/21/21:  There.ex:  Nustep L6 for 10 minutes B LE's for warm up and LE strengthening maintaining >100 SPM.  0.56 miles.    TRX squats 20x with good technique/ feedback.    Walking lunges in //-bars (mirror feedback) L/R 4 laps    Nautilus ex.: resisted gait 50# 4x all 4-planes (no UE assist).  50# lat. Pull downs in standing position 15x2/ 50# scapular retraction in standing 15x2.    Walking 6" hurdles with no assistive device/ no UE assist.  SBA/CGA for safety.    B hip flexion 4/5 MMT.    Walking in clinic/ outside with focus on hip/knee flexion and step pattern.  Pt. Working on heel strike/midstance/toe off, esp. When changing thresholds/ walking in grass.  Walking up/down curbs and grass/ mulch outside.      Access Code: [facility] URL: [email or web address] Date: 10/23/2021 Prepared by: [place] with Counter Support  - 1 x daily - 7 x weekly - 3 sets - 10 reps - Lunge with Counter Support  - 1 x daily - 7 x weekly - 3 sets - 6 reps - Standing Single Leg  Stance with Counter Support  - 1 x daily - 7 x weekly - 2 sets - 3 reps - 30 hold - Seated Hip  Flexion March with Ankle Weights  - 1 x daily - 7 x weekly - 3 sets - 10 reps - Seated Long Arc Quad with Ankle Weight  - 1 x daily - 7 x weekly - 3 sets - 10 reps - Side Stepping with Resistance at Ankles and Counter Support  - 1 x daily - 7 x weekly - 3 sets - 10 reps    PATIENT EDUCATION:  Education details: Updated HEP. Access Code: [email or web address] Person educated: Patient Education method: Explanation, Demonstration, and Handouts Education comprehension: verbalized understanding and returned demonstration   HOME EXERCISE PROGRAM: Access Code: [facility] URL: [email or web address] Date: 09/13/2021 Prepared by: Dorcas Carrow  Exercises - Standing March with Unilateral Counter Support  - 1 x daily - 7 x weekly - 1 sets - 20 reps - Sit to Stand Without Arm Support  - 1 x daily - 7 x weekly - 1 sets - 20 reps - Supine Active Straight Leg Raise  - 1 x daily - 7 x weekly - 1 sets - 20 reps - Supine March  - 1 x daily - 7 x weekly - 1 sets - 20 reps - Seated Long Arc Quad  - 1 x daily - 7 x weekly - 1 sets - 20 reps  ASSESSMENT:  CLINICAL IMPRESSION: Pt. Reports no c/o back pain during tx. Session and works hard with LE strengthening. Pt. tolerates progression of strengthening ex. per POC without pain.  Pt. Requires use of SPC with 2-point gait pattern to increase hip flexion/ heel strike, esp. On R.  Fatigue noted in B hips at end of tx. Session.  Tx. Focus on walking outside/ ascending and descending curbs with use of SPC.  Pt will continue to benefit from skilled PT services to progress strength and balance to reduce risk of falls.     OBJECTIVE IMPAIRMENTS Abnormal gait, decreased activity tolerance, decreased balance, decreased endurance, decreased mobility, difficulty walking, decreased ROM, decreased strength, impaired flexibility, improper body mechanics, postural dysfunction, and pain.   ACTIVITY LIMITATIONS carrying, lifting, bending, standing, squatting, stairs,  transfers, and locomotion level  PARTICIPATION LIMITATIONS: cleaning, community activity, occupation, and yard work  PERSONAL FACTORS Fitness, Past/current experiences, and Profession are also affecting patient's functional outcome.   REHAB POTENTIAL: Good  CLINICAL DECISION MAKING: Evolving/moderate complexity  EVALUATION COMPLEXITY: Moderate   GOALS: Goals reviewed with patient? Yes  SHORT TERM GOALS: Target date: 01/11/22  Pt. Independent with HEP to increase B LE strength 1/2 muscle grade to improve standing/ independence with gait.   Baseline:  see above Goal status: Partially met - MMT - 11/13/21 R hip flexion 4/5, L hip flexion 4+/5.  B quads/ hamstring 5/5 MMT.     LONG TERM GOALS: Target date: 02/08/22     Pt. Will increase FOTO to 58 to improve functional mobility.   Baseline: Initial FOTO 44 ; 53 on 10/16/21.  9/1: 67 goal met on 9/1 Goal status: Goal met  2.  Pt. Will report no low back pain consistently for 1 week with all work-related/ household tasks.   Baseline: increase back pain with increase activity.  10/16/21: Pt only experiences pain with heavy lifting and prolonged standing  Goal status: Partially met  3.  Pt. Will ambulate with more normalized gait pattern with consistent hip flexion/ step length with least assistive device safely.  Baseline: R foot lag - decreased step length  Goal status: Partially met   PLAN: PT FREQUENCY: 1-2x/week  PT DURATION: 8 weeks  PLANNED INTERVENTIONS: Therapeutic exercises, Therapeutic activity, Neuromuscular re-education, Balance training, Gait training, Patient/Family education, Joint mobilization, Stair training, Spinal mobilization, Cryotherapy, Moist heat, and Manual therapy.  PLAN FOR NEXT SESSION: Progress HEP, gait and stair training. Motor planning.     Pura Spice, PT, DPT # [phone] 12/21/2021, 1:39 PM

## 2021-12-28 ENCOUNTER — Ambulatory Visit: Payer: Medicare Other | Admitting: Physical Therapy

## 2021-12-28 DIAGNOSIS — M6281 Muscle weakness (generalized): Secondary | ICD-10-CM

## 2021-12-28 DIAGNOSIS — M2560 Stiffness of unspecified joint, not elsewhere classified: Secondary | ICD-10-CM

## 2021-12-28 DIAGNOSIS — M545 Low back pain, unspecified: Secondary | ICD-10-CM | POA: Diagnosis not present

## 2021-12-28 DIAGNOSIS — G8929 Other chronic pain: Secondary | ICD-10-CM | POA: Diagnosis not present

## 2021-12-29 NOTE — Therapy (Signed)
OUTPATIENT PHYSICAL THERAPY THORACOLUMBAR TREATMENT  Patient Name: Larry Blair MRN: [id number] DOB:[birth year], 66 y.o., male Today's Date: 12/28/2021   PT End of Session - 12/29/21 1607     Visit Number 25    Number of Visits 31    Date for PT Re-Evaluation 02/08/22    PT Start Time 1112    PT Stop Time 1158    PT Time Calculation (min) 46 min    Activity Tolerance Patient tolerated treatment well    Behavior During Therapy [facility] for tasks assessed/performed             Past Medical History:  Diagnosis Date   Allergy    Arthritis    right shoulder   Asthma    Eczema    HTN (hypertension)    Hyperlipidemia    Substance abuse ([place]) 1976   daily   Past Surgical History:  Procedure Laterality Date   ANTERIOR CERVICAL DECOMP/DISCECTOMY FUSION N/A 08/30/2019   Procedure: CERVICAL THREE-FOUR ANTERIOR CERVICAL DECOMPRESSION/FUSION;  Surgeon: Eustace Moore, MD;  Location: [place];  Service: Neurosurgery;  Laterality: N/A;   COLONOSCOPY     KNEE ARTHROSCOPY Left    Patient Active Problem List   Diagnosis Date Noted   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/15/2021   Chronic bilateral low back pain without sciatica 08/15/2021   Need for prophylactic vaccination and inoculation against varicella 08/15/2021   Benign prostatic hyperplasia without lower urinary tract symptoms 10/03/2020   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   Diuretic-induced hypokalemia 11/24/2017   Chronic eczema of hand 08/11/2017   Hyperlipidemia LDL goal <70 07/01/2017   Routine general medical examination at a health care facility 06/30/2017   Essential hypertension 06/30/2017   Vitamin D deficiency 06/30/2017    PCP: Janith Lima, MD   REFERRING PROVIDER: Janith Lima, MD   REFERRING DIAG: Chronic bilateral low back pain without sciatica  Rationale for Evaluation and Treatment Rehabilitation  THERAPY DIAG:  Chronic bilateral low back pain without sciatica  Joint stiffness  Muscle weakness  (generalized)  ONSET DATE: 05/10/2019 (approximate date)- ACDF 08/30/19.    SUBJECTIVE:                                                                                                                                                                                           SUBJECTIVE STATEMENT: 12/28/21:  Pt. Reports no falls and no LBP at this time.  Pt. Worked 3 days this past week with no issues reported.  Pt. C/o L knee discomfort this morning, no reason given.     PERTINENT HISTORY:  ACDF, hx of knee scope on  L knee.  Still utilizes knee brace. Hx of chronic ankle rolling bilaterally.   PAIN:  Are you having pain? Yes: NPRS scale: 0/10 Pain location: Low back Pain description: dull achy Aggravating factors: Prolonged standing at sink and carrying laundry Relieving factors: sitting, tylenol 650 mg 2 in morning   PRECAUTIONS: None  WEIGHT BEARING RESTRICTIONS No  FALLS:  Has patient fallen in last 6 months? 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    OBJECTIVE:   PATIENT SURVEYS:  FOTO initial 44/ goal 58 10/16/21: 58, 9/1: 33  SCREENING FOR RED FLAGS: Bowel or bladder incontinence: Yes: Hard to hold urine Spinal tumors: No Cauda equina syndrome: No Compression fracture: No Abdominal aneurysm: No  COGNITION:  Overall cognitive status: Within functional limits for tasks assessed     SENSATION: Light touch: Impaired  on L LE  POSTURE: rounded shoulders  LOWER/UPPER EXTREMITY ROM:     Limited L/R SLR in  supine position secondary to muscle weakness.  B shoulder and knee/LE AROM WFL  LOWER/UPPER EXTREMITY MMT:       Difficulty lifting up R hip (flexion).    MMT Right eval Left eval  Hip flexion 3/5 3/5  Hip extension 4/5 4/5  Hip abduction 4+/5 4+/5  Hip adduction 4+/5 4+/5  Hip internal rotation 4/5 5/5  Hip external rotation 4/5 5/5  Knee flexion 4/5 4/5  Knee extension 4-/5 4-/5  Ankle dorsiflexion 4/5 4/5  Ankle plantarflexion 4/5 4/5  Ankle inversion    Ankle eversion     (Blank rows = not tested)    B shoulder strength grossly: flexion 4/5 MMT, abduction 3/5, extension 4/5, bicep 5/5, tricep 5/5, sh. IR 4/5, ER 5/5 MMT.  Grip L 72#/ R 70#.       GAIT: Distance walked: in clinic (decrease hip flexion/ step length).   Assistive device utilized: Single point cane Level of assistance: Modified independence   Treatment:   12/28/21:  There.ex:  Nustep L6 for 10 minutes B LE's for warm up and LE strengthening maintaining >100 SPM.      Walking in //-bars/gym with increase hip/ knee flexion focus  TRX squats 20x with good technique/ feedback.    Ascending/ descending stairs with consistent recip. Gait pattern working on hip flexion/ preventing foot clipping on step.    Nautilus ex.: resisted gait 60# 4x all 4-planes (no UE assist).    Supine LE stretches (8 min.)- generalized.  Focus on hamstring/ gastroc   Walking in clinic/ outside with focus on hip/knee flexion and step pattern.  Pt. Working on heel strike/midstance/toe off, esp. When changing thresholds/ walking in grass.  Walking up/down curbs and grass.    Access Code: [facility] URL: [email or web address] Date: 10/23/2021 Prepared by: [place] with Counter Support  - 1 x daily - 7 x weekly - 3 sets - 10 reps - Lunge with Counter Support  - 1 x daily - 7 x weekly - 3 sets - 6 reps - Standing Single  Leg Stance with Counter Support  - 1 x daily - 7 x weekly - 2  sets - 3 reps - 30 hold - Seated Hip Flexion March with Ankle Weights  - 1 x daily - 7 x weekly - 3 sets - 10 reps - Seated Long Arc Quad with Ankle Weight  - 1 x daily - 7 x weekly - 3 sets - 10 reps - Side Stepping with Resistance at Ankles and Counter Support  - 1 x daily - 7 x weekly - 3 sets - 10 reps    PATIENT EDUCATION:  Education details: Updated HEP. Access Code: [email or web address] Person educated: Patient Education method: Explanation, Demonstration, and Handouts Education comprehension: verbalized understanding and returned demonstration   HOME EXERCISE PROGRAM: Access Code: [facility] URL: [email or web address] Date: 09/13/2021 Prepared by: Dorcas Carrow  Exercises - Standing March with Unilateral Counter Support  - 1 x daily - 7 x weekly - 1 sets - 20 reps - Sit to Stand Without Arm Support  - 1 x daily - 7 x weekly - 1 sets - 20 reps - Supine Active Straight Leg Raise  - 1 x daily - 7 x weekly - 1 sets - 20 reps - Supine March  - 1 x daily - 7 x weekly - 1 sets - 20 reps - Seated Long Arc Quad  - 1 x daily - 7 x weekly - 1 sets - 20 reps  ASSESSMENT:  CLINICAL IMPRESSION: Pt. Reports no c/o back pain during tx. Session and works hard with LE strengthening. Pt. tolerates progression of strengthening ex. per POC without pain.  Pt. Requires use of SPC with 2-point gait pattern to increase hip flexion/ heel strike, esp. On R.  Fatigue noted in B hips at end of tx. Session.  Tx. Focus on walking outside/ ascending and descending curbs with use of SPC and increasing hip flexion.  Pt will continue to benefit from skilled PT services to progress strength and balance to reduce risk of falls.    OBJECTIVE IMPAIRMENTS Abnormal gait, decreased activity tolerance, decreased balance, decreased endurance, decreased mobility, difficulty walking, decreased ROM, decreased strength, impaired flexibility, improper body mechanics, postural dysfunction, and pain.   ACTIVITY LIMITATIONS  carrying, lifting, bending, standing, squatting, stairs, transfers, and locomotion level  PARTICIPATION LIMITATIONS: cleaning, community activity, occupation, and yard work  PERSONAL FACTORS Fitness, Past/current experiences, and Profession are also affecting patient's functional outcome.   REHAB POTENTIAL: Good  CLINICAL DECISION MAKING: Evolving/moderate complexity  EVALUATION COMPLEXITY: Moderate   GOALS: Goals reviewed with patient? Yes  SHORT TERM GOALS: Target date: 01/11/22  Pt. Independent with HEP to increase B LE strength 1/2 muscle grade to improve standing/ independence with gait.   Baseline:  see above Goal status: Partially met - MMT - 11/13/21 R hip flexion 4/5, L hip flexion 4+/5.  B quads/ hamstring 5/5 MMT.     LONG TERM GOALS: Target date: 02/08/22     Pt. Will increase FOTO to 58 to improve functional mobility.   Baseline: Initial FOTO 44 ; 53 on 10/16/21.  9/1: 67 goal met on 9/1 Goal status: Goal met  2.  Pt. Will report no low back pain consistently for 1 week with all work-related/ household tasks.   Baseline: increase back pain with increase activity.  10/16/21: Pt only experiences pain with heavy lifting and prolonged standing  Goal status: Partially met  3.  Pt. Will ambulate with more normalized gait pattern with consistent hip flexion/ step length with least assistive  device safely.   Baseline: R foot lag - decreased step length  Goal status: Partially met   PLAN: PT FREQUENCY: 1-2x/week  PT DURATION: 8 weeks  PLANNED INTERVENTIONS: Therapeutic exercises, Therapeutic activity, Neuromuscular re-education, Balance training, Gait training, Patient/Family education, Joint mobilization, Stair training, Spinal mobilization, Cryotherapy, Moist heat, and Manual therapy.  PLAN FOR NEXT SESSION: Progress HEP, gait and stair training. Motor planning.     Pura Spice, PT, DPT # [phone] 12/29/2021, 4:08 PM

## 2022-01-02 ENCOUNTER — Other Ambulatory Visit: Payer: Self-pay | Admitting: Internal Medicine

## 2022-01-02 DIAGNOSIS — L309 Dermatitis, unspecified: Secondary | ICD-10-CM

## 2022-01-31 ENCOUNTER — Other Ambulatory Visit: Payer: Self-pay | Admitting: Internal Medicine

## 2022-01-31 DIAGNOSIS — L309 Dermatitis, unspecified: Secondary | ICD-10-CM

## 2022-02-14 ENCOUNTER — Other Ambulatory Visit: Payer: Self-pay | Admitting: Internal Medicine

## 2022-02-14 DIAGNOSIS — E785 Hyperlipidemia, unspecified: Secondary | ICD-10-CM

## 2022-02-14 DIAGNOSIS — I10 Essential (primary) hypertension: Secondary | ICD-10-CM

## 2022-02-16 ENCOUNTER — Other Ambulatory Visit: Payer: Self-pay | Admitting: Internal Medicine

## 2022-02-16 DIAGNOSIS — I10 Essential (primary) hypertension: Secondary | ICD-10-CM

## 2022-02-20 IMAGING — DX DG SHOULDER 2+V*R*
3 series · 3 of 3 positions shown · non-contrast
Comparison: None.

CLINICAL DATA: 63-year-old male with fall and right shoulder pain.

EXAM:
RIGHT SHOULDER - 2+ VIEW

[shoulder (grashey view) ap]
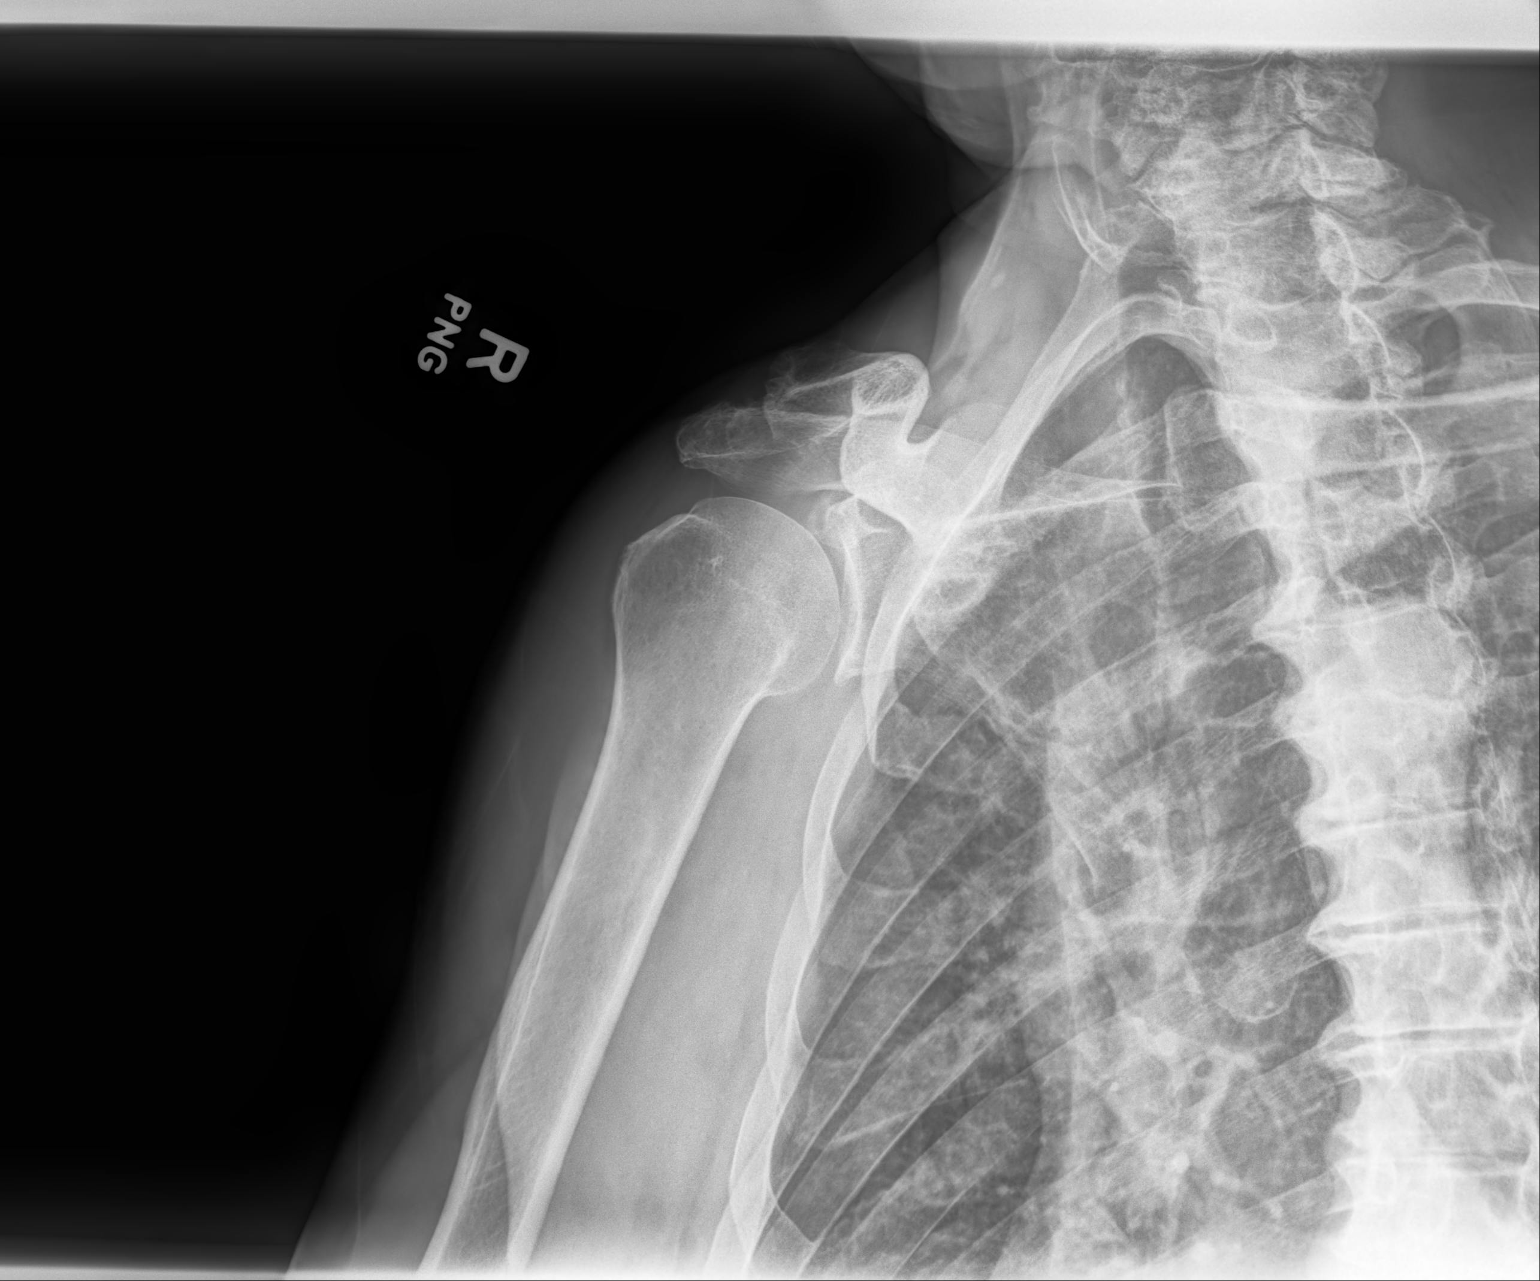

[shoulder (y view) lat]
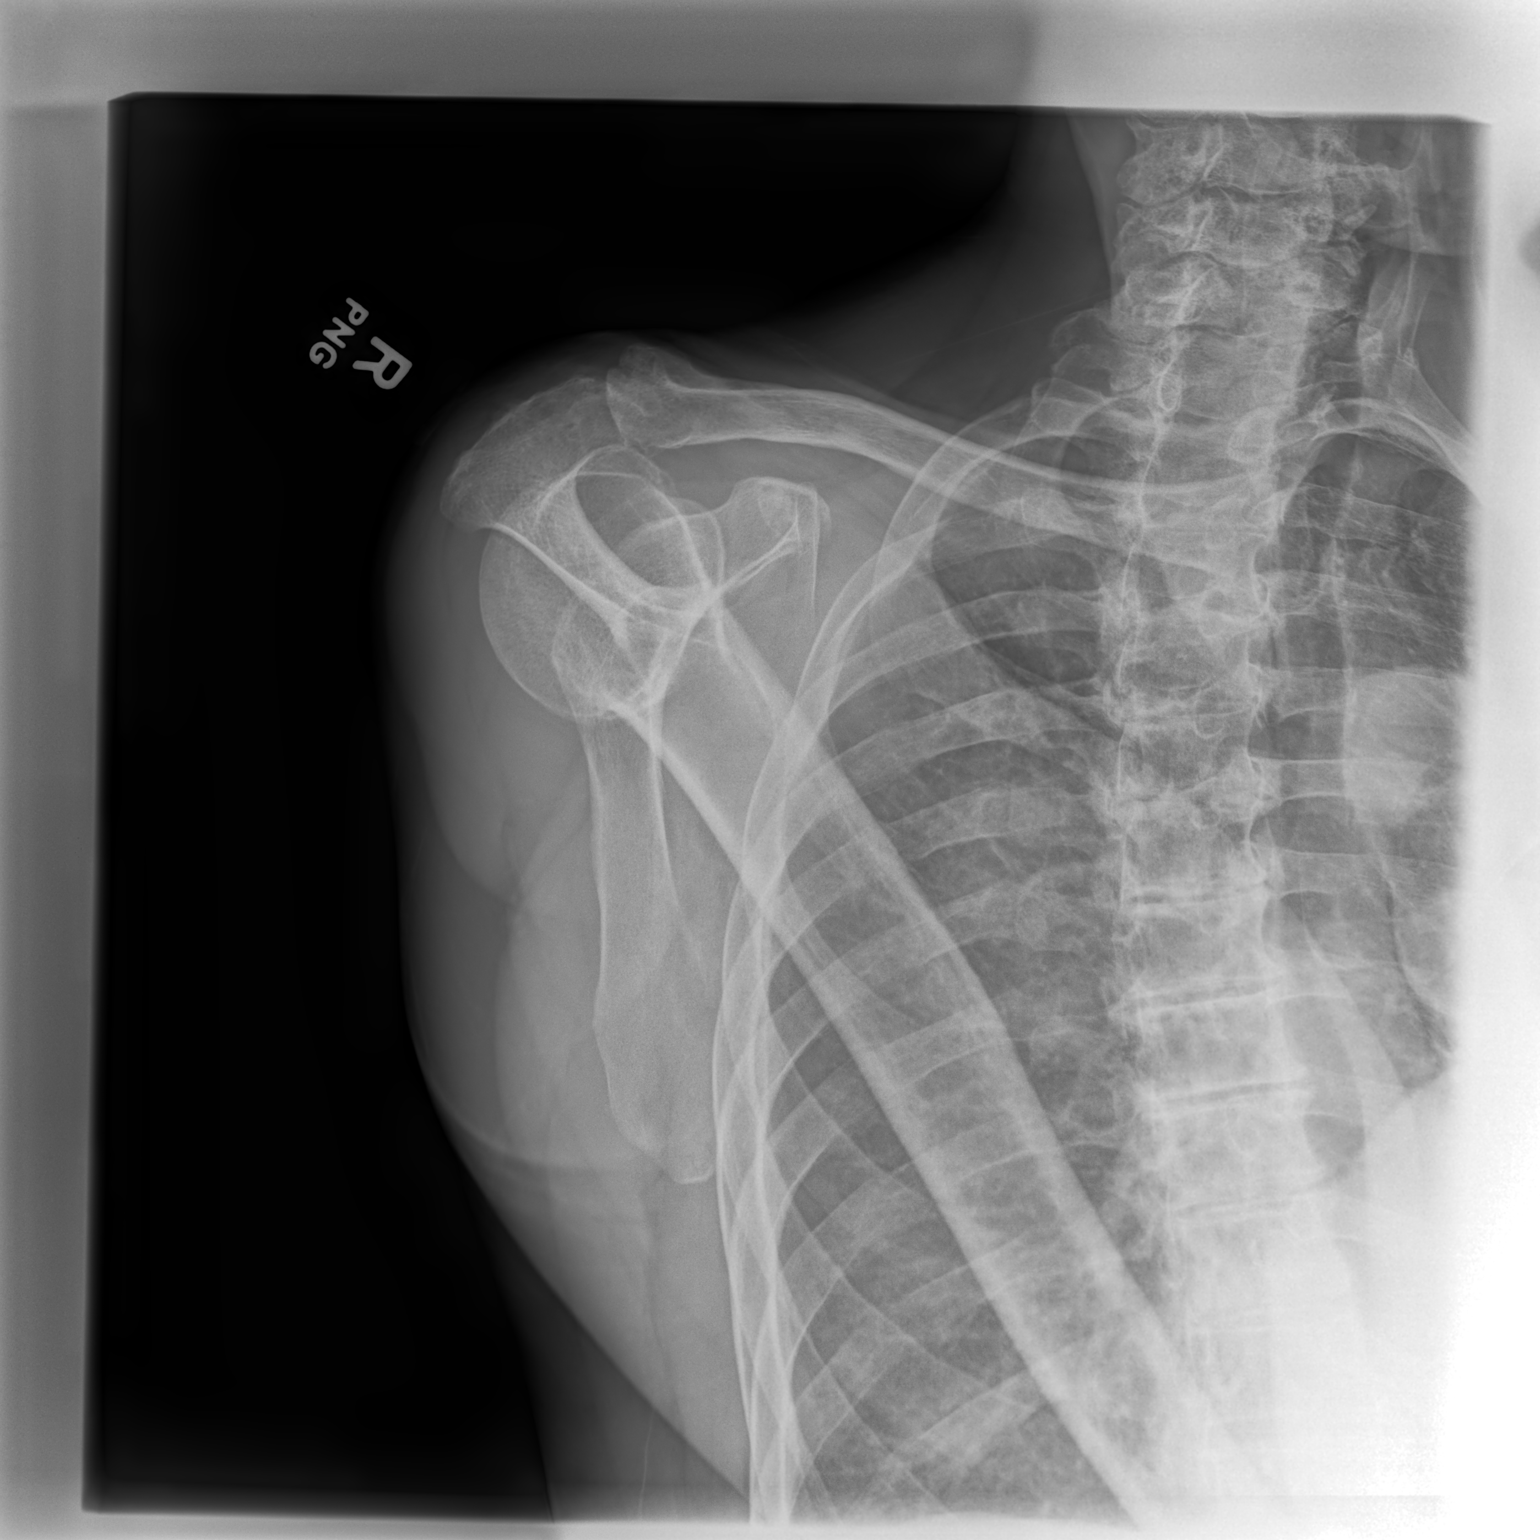

[shoulder axial 45°]
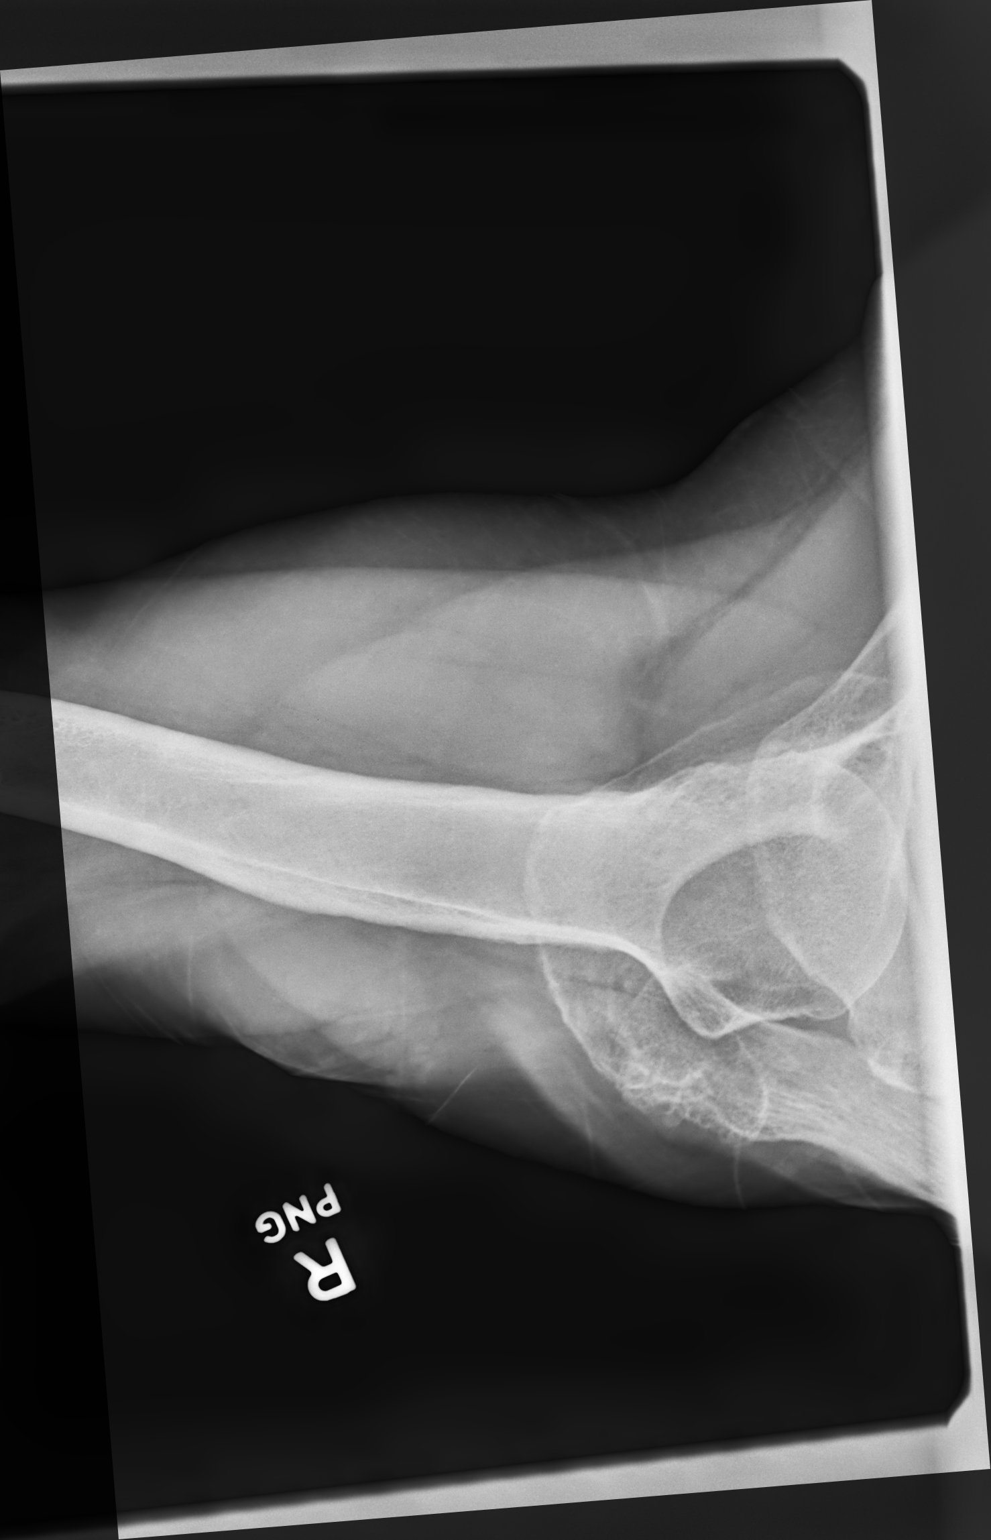

[3 of 3 positions shown; findings below may reference images not displayed]

FINDINGS: There is no acute fracture or dislocation. There is mild arthritic
changes of the right AC joint with bone spurring. The soft tissues
are unremarkable.
IMPRESSION: No acute fracture or dislocation.

## 2022-04-02 ENCOUNTER — Other Ambulatory Visit: Payer: Self-pay | Admitting: Internal Medicine

## 2022-04-02 DIAGNOSIS — L309 Dermatitis, unspecified: Secondary | ICD-10-CM

## 2022-04-03 ENCOUNTER — Other Ambulatory Visit: Payer: Self-pay | Admitting: Internal Medicine

## 2022-04-03 DIAGNOSIS — J4489 Other specified chronic obstructive pulmonary disease: Secondary | ICD-10-CM

## 2022-05-12 ENCOUNTER — Other Ambulatory Visit: Payer: Self-pay | Admitting: Internal Medicine

## 2022-05-12 DIAGNOSIS — E785 Hyperlipidemia, unspecified: Secondary | ICD-10-CM

## 2022-05-14 ENCOUNTER — Other Ambulatory Visit: Payer: Self-pay | Admitting: Internal Medicine

## 2022-05-14 DIAGNOSIS — I10 Essential (primary) hypertension: Secondary | ICD-10-CM

## 2022-06-17 ENCOUNTER — Other Ambulatory Visit: Payer: Self-pay | Admitting: Internal Medicine

## 2022-06-17 DIAGNOSIS — L309 Dermatitis, unspecified: Secondary | ICD-10-CM

## 2022-06-26 ENCOUNTER — Ambulatory Visit: Payer: Medicare Other | Admitting: Internal Medicine

## 2022-06-27 ENCOUNTER — Ambulatory Visit: Payer: Medicare Other | Admitting: Internal Medicine

## 2022-07-01 ENCOUNTER — Ambulatory Visit: Payer: Medicare Other | Admitting: Internal Medicine

## 2022-07-04 ENCOUNTER — Encounter: Payer: Self-pay | Admitting: Internal Medicine

## 2022-07-04 ENCOUNTER — Ambulatory Visit (INDEPENDENT_AMBULATORY_CARE_PROVIDER_SITE_OTHER): Payer: Medicare Other | Admitting: Internal Medicine

## 2022-07-04 VITALS — BP 132/82 | HR 61 | Temp 97.8°F | Resp 16 | Ht 71.0 in | Wt 186.0 lb

## 2022-07-04 DIAGNOSIS — G8929 Other chronic pain: Secondary | ICD-10-CM

## 2022-07-04 DIAGNOSIS — E876 Hypokalemia: Secondary | ICD-10-CM

## 2022-07-04 DIAGNOSIS — T502X5A Adverse effect of carbonic-anhydrase inhibitors, benzothiadiazides and other diuretics, initial encounter: Secondary | ICD-10-CM

## 2022-07-04 DIAGNOSIS — Z0000 Encounter for general adult medical examination without abnormal findings: Secondary | ICD-10-CM | POA: Diagnosis not present

## 2022-07-04 DIAGNOSIS — M545 Low back pain, unspecified: Secondary | ICD-10-CM | POA: Diagnosis not present

## 2022-07-04 DIAGNOSIS — Z0001 Encounter for general adult medical examination with abnormal findings: Secondary | ICD-10-CM | POA: Insufficient documentation

## 2022-07-04 DIAGNOSIS — N400 Enlarged prostate without lower urinary tract symptoms: Secondary | ICD-10-CM | POA: Diagnosis not present

## 2022-07-04 DIAGNOSIS — E785 Hyperlipidemia, unspecified: Secondary | ICD-10-CM

## 2022-07-04 DIAGNOSIS — Z1211 Encounter for screening for malignant neoplasm of colon: Secondary | ICD-10-CM | POA: Insufficient documentation

## 2022-07-04 DIAGNOSIS — I10 Essential (primary) hypertension: Secondary | ICD-10-CM | POA: Diagnosis not present

## 2022-07-04 LAB — HEPATIC FUNCTION PANEL
ALT: 20 U/L (ref 0–53)
AST: 20 U/L (ref 0–37)
Albumin: 4.5 g/dL (ref 3.5–5.2)
Alkaline Phosphatase: 57 U/L (ref 39–117)
Bilirubin, Direct: 0.2 mg/dL (ref 0.0–0.3)
Total Bilirubin: 0.8 mg/dL (ref 0.2–1.2)
Total Protein: 7.3 g/dL (ref 6.0–8.3)

## 2022-07-04 LAB — CBC WITH DIFFERENTIAL/PLATELET
Basophils Absolute: 0 10*3/uL (ref 0.0–0.1)
Basophils Relative: 0.5 % (ref 0.0–3.0)
Eosinophils Absolute: 0.1 10*3/uL (ref 0.0–0.7)
Eosinophils Relative: 1.2 % (ref 0.0–5.0)
HCT: 42.5 % (ref 39.0–52.0)
Hemoglobin: 14.1 g/dL (ref 13.0–17.0)
Lymphocytes Relative: 34.1 % (ref 12.0–46.0)
Lymphs Abs: 2.4 10*3/uL (ref 0.7–4.0)
MCHC: 33.2 g/dL (ref 30.0–36.0)
MCV: 99.3 fl (ref 78.0–100.0)
Monocytes Absolute: 0.6 10*3/uL (ref 0.1–1.0)
Monocytes Relative: 8.6 % (ref 3.0–12.0)
Neutro Abs: 3.9 10*3/uL (ref 1.4–7.7)
Neutrophils Relative %: 55.6 % (ref 43.0–77.0)
Platelets: 232 10*3/uL (ref 150.0–400.0)
RBC: 4.29 Mil/uL (ref 4.22–5.81)
RDW: 12.8 % (ref 11.5–15.5)
WBC: 7.1 10*3/uL (ref 4.0–10.5)

## 2022-07-04 LAB — BASIC METABOLIC PANEL
BUN: 14 mg/dL (ref 6–23)
CO2: 33 mEq/L — ABNORMAL HIGH (ref 19–32)
Calcium: 9.8 mg/dL (ref 8.4–10.5)
Chloride: 98 mEq/L (ref 96–112)
Creatinine, Ser: 0.87 mg/dL (ref 0.40–1.50)
GFR: 88.55 mL/min (ref >60.00)
Glucose, Bld: 85 mg/dL (ref 70–99)
Potassium: 3.9 mEq/L (ref 3.5–5.1)
Sodium: 138 mEq/L (ref 135–145)

## 2022-07-04 LAB — LIPID PANEL
Cholesterol: 162 mg/dL (ref 0–200)
HDL: 71.4 mg/dL (ref >39.00)
LDL Cholesterol: 82 mg/dL (ref 0–99)
NonHDL: 90.23
Total CHOL/HDL Ratio: 2
Triglycerides: 42 mg/dL (ref 0.0–149.0)
VLDL: 8.4 mg/dL (ref 0.0–40.0)

## 2022-07-04 LAB — CK: Total CK: 79 U/L (ref 7–232)

## 2022-07-04 LAB — URINALYSIS, ROUTINE W REFLEX MICROSCOPIC
Bilirubin Urine: NEGATIVE
Hgb urine dipstick: NEGATIVE
Ketones, ur: NEGATIVE
Leukocytes,Ua: NEGATIVE
Nitrite: NEGATIVE
Specific Gravity, Urine: 1.02 (ref 1.000–1.030)
Total Protein, Urine: NEGATIVE
Urine Glucose: NEGATIVE
Urobilinogen, UA: 0.2 (ref 0.0–1.0)
pH: 5.5 (ref 5.0–8.0)

## 2022-07-04 LAB — PSA: PSA: 1.47 ng/mL (ref 0.10–4.00)

## 2022-07-04 LAB — TSH: TSH: 1.81 u[IU]/mL (ref 0.35–5.50)

## 2022-07-04 MED ORDER — ATORVASTATIN CALCIUM 10 MG PO TABS: 10.0000 mg | ORAL_TABLET | Freq: Every day | ORAL | 1 refills | Status: DC

## 2022-07-04 MED ORDER — CARVEDILOL 6.25 MG PO TABS: 6.2500 mg | ORAL_TABLET | Freq: Two times a day (BID) | ORAL | 1 refills | Status: DC

## 2022-07-04 MED ORDER — INDAPAMIDE 1.25 MG PO TABS: 1.2500 mg | ORAL_TABLET | Freq: Every day | ORAL | 1 refills | Status: DC

## 2022-07-04 NOTE — Patient Instructions (Signed)
Health Maintenance, Male Adopting a healthy lifestyle and getting preventive care are important in promoting health and wellness. Ask your health care provider about: The right schedule for you to have regular tests and exams. Things you can do on your own to prevent diseases and keep yourself healthy. What should I know about diet, weight, and exercise? Eat a healthy diet  Eat a diet that includes plenty of vegetables, fruits, low-fat dairy products, and lean protein. Do not eat a lot of foods that are high in solid fats, added sugars, or sodium. Maintain a healthy weight Body mass index (BMI) is a measurement that can be used to identify possible weight problems. It estimates body fat based on height and weight. Your health care provider can help determine your BMI and help you achieve or maintain a healthy weight. Get regular exercise Get regular exercise. This is one of the most important things you can do for your health. Most adults should: Exercise for at least 150 minutes each week. The exercise should increase your heart rate and make you sweat (moderate-intensity exercise). Do strengthening exercises at least twice a week. This is in addition to the moderate-intensity exercise. Spend less time sitting. Even light physical activity can be beneficial. Watch cholesterol and blood lipids Have your blood tested for lipids and cholesterol at 69 years of age, then have this test every 5 years. You may need to have your cholesterol levels checked more often if: Your lipid or cholesterol levels are high. You are older than 69 years of age. You are at high risk for heart disease. What should I know about cancer screening? Many types of cancers can be detected early and may often be prevented. Depending on your health history and family history, you may need to have cancer screening at various ages. This may include screening for: Colorectal cancer. Prostate cancer. Skin cancer. Lung  cancer. What should I know about heart disease, diabetes, and high blood pressure? Blood pressure and heart disease High blood pressure causes heart disease and increases the risk of stroke. This is more likely to develop in people who have high blood pressure readings or are overweight. Talk with your health care provider about your target blood pressure readings. Have your blood pressure checked: Every 3-5 years if you are 18-39 years of age. Every year if you are 40 years old or older. If you are between the ages of 65 and 75 and are a current or former smoker, ask your health care provider if you should have a one-time screening for abdominal aortic aneurysm (AAA). Diabetes Have regular diabetes screenings. This checks your fasting blood sugar level. Have the screening done: Once every three years after age 45 if you are at a normal weight and have a low risk for diabetes. More often and at a younger age if you are overweight or have a high risk for diabetes. What should I know about preventing infection? Hepatitis B If you have a higher risk for hepatitis B, you should be screened for this virus. Talk with your health care provider to find out if you are at risk for hepatitis B infection. Hepatitis C Blood testing is recommended for: Everyone born from 1945 through 1965. Anyone with known risk factors for hepatitis C. Sexually transmitted infections (STIs) You should be screened each year for STIs, including gonorrhea and chlamydia, if: You are sexually active and are younger than 69 years of age. You are older than 69 years of age and your   health care provider tells you that you are at risk for this type of infection. Your sexual activity has changed since you were last screened, and you are at increased risk for chlamydia or gonorrhea. Ask your health care provider if you are at risk. Ask your health care provider about whether you are at high risk for HIV. Your health care provider  may recommend a prescription medicine to help prevent HIV infection. If you choose to take medicine to prevent HIV, you should first get tested for HIV. You should then be tested every 3 months for as long as you are taking the medicine. Follow these instructions at home: Alcohol use Do not drink alcohol if your health care provider tells you not to drink. If you drink alcohol: Limit how much you have to 0-2 drinks a day. Know how much alcohol is in your drink. In the [place] one drink equals one 12 oz bottle of beer (355 mL), one 5 oz glass of wine (148 mL), or one 1 oz glass of hard liquor (44 mL). Lifestyle Do not use any products that contain nicotine or tobacco. These products include cigarettes, chewing tobacco, and vaping devices, such as e-cigarettes. If you need help quitting, ask your health care provider. Do not use street drugs. Do not share needles. Ask your health care provider for help if you need support or information about quitting drugs. General instructions Schedule regular health, dental, and eye exams. Stay current with your vaccines. Tell your health care provider if: You often feel depressed. You have ever been abused or do not feel safe at home. Summary Adopting a healthy lifestyle and getting preventive care are important in promoting health and wellness. Follow your health care provider's instructions about healthy diet, exercising, and getting tested or screened for diseases. Follow your health care provider's instructions on monitoring your cholesterol and blood pressure. This information is not intended to replace advice given to you by your health care provider. Make sure you discuss any questions you have with your health care provider. Document Revised: 08/14/2020 Document Reviewed: 08/14/2020 Elsevier Patient Education  2023 Elsevier Inc.  

## 2022-07-04 NOTE — Progress Notes (Signed)
Subjective:  Patient ID: Larry Blair, male    DOB: [birth year]  Age: 67 y.o. MRN: [id number]  CC: Annual Exam, Hypertension, COPD, and Hyperlipidemia   HPI Delmon Kondo presents for a CPX and f/up ---  He is active and denies chest pain, shortness of breath, diaphoresis, or edema.  Outpatient Medications Prior to Visit  Medication Sig Dispense Refill   acetaminophen (TYLENOL) 650 MG CR tablet Take 1,300 mg by mouth every 8 (eight) hours as needed for pain.     albuterol (VENTOLIN HFA) 108 (90 Base) MCG/ACT inhaler Inhale 2 puffs into the lungs every 6 (six) hours as needed for wheezing or shortness of breath. 18 g 3   bisacodyl (DULCOLAX) 5 MG EC tablet Take 5 mg by mouth See admin instructions. Take 5 mg daily, may take a second 5 mg dose as needed for constipation     Budeson-Glycopyrrol-Formoterol (BREZTRI AEROSPHERE) 160-9-4.8 MCG/ACT AERO INHALE 2 PUFFS INTO THE LUNGS TWICE DAILY 33 g 5   fexofenadine (ALLEGRA) 180 MG tablet Take 180 mg by mouth daily.     fluocinonide cream (LIDEX) 0.05 % APPLY  CREAM EXTERNALLY TWICE DAILY 120 g 0   Multiple Vitamin (MULTIVITAMIN WITH MINERALS) TABS tablet Take 1 tablet by mouth daily. Centrum Silver     atorvastatin (LIPITOR) 10 MG tablet Take 1 tablet by mouth once daily 90 tablet 0   carvedilol (COREG) 6.25 MG tablet TAKE 1 TABLET BY MOUTH TWICE DAILY WITH A MEAL 180 tablet 0   indapamide (LOZOL) 1.25 MG tablet Take 1 tablet by mouth once daily 90 tablet 0   No facility-administered medications prior to visit.    ROS Review of Systems  Constitutional: Negative.  Negative for chills, diaphoresis and fatigue.  HENT: Negative.    Eyes: Negative.   Respiratory:  Negative for cough, chest tightness, shortness of breath and wheezing.   Cardiovascular:  Negative for chest pain, palpitations and leg swelling.  Gastrointestinal: Negative.  Negative for abdominal pain, constipation, diarrhea, nausea and vomiting.  Genitourinary: Negative.   Negative for difficulty urinating.       ++ nocturia  Musculoskeletal:  Positive for arthralgias, back pain and gait problem. Negative for myalgias.  Skin: Negative.  Negative for color change.  Neurological:  Negative for dizziness, weakness, light-headedness and headaches.  Hematological:  Negative for adenopathy. Does not bruise/bleed easily.  Psychiatric/Behavioral: Negative.      Objective:  BP 132/82 (BP Location: Left Arm, Patient Position: Sitting, Cuff Size: Large)   Pulse 61   Temp 97.8 F (36.6 C) (Oral)   Resp 16   Ht 5\' 11"  (1.803 m)   Wt 186 lb (84.4 kg)   SpO2 98%   BMI 25.94 kg/m   BP Readings from Last 3 Encounters:  07/04/22 132/82  08/15/21 138/86  10/03/20 132/88    Wt Readings from Last 3 Encounters:  07/04/22 186 lb (84.4 kg)  08/15/21 192 lb (87.1 kg)  10/03/20 191 lb (86.6 kg)    Physical Exam Vitals reviewed. Exam conducted with a chaperone present.  HENT:     Nose: Nose normal.     Mouth/Throat:     Mouth: Mucous membranes are moist.  Eyes:     General:        Left eye: No discharge.     Conjunctiva/sclera: Conjunctivae normal.  Cardiovascular:     Rate and Rhythm: Regular rhythm. Bradycardia present.     Heart sounds: Normal heart sounds, S1 normal and S2 normal. Heart sounds  not distant. No murmur heard.    Comments: EKG--  SB, 55 bpm No ST/T wave changes, LVH, or Q waves Pulmonary:     Effort: Pulmonary effort is normal.     Breath sounds: No stridor. No wheezing, rhonchi or rales.  Abdominal:     Palpations: There is no mass.     Tenderness: There is no abdominal tenderness. There is no guarding.     Hernia: No hernia is present. There is no hernia in the left inguinal area or right inguinal area.  Genitourinary:    Pubic Area: No rash.      Penis: Normal and circumcised.      Testes: Normal.     Epididymis:     Right: Normal.     Left: Normal.     Prostate: Enlarged. Not tender and no nodules present.     Rectum: Normal.  Guaiac result negative. No mass, tenderness, anal fissure, external hemorrhoid or internal hemorrhoid. Normal anal tone.  Musculoskeletal:     Cervical back: Neck supple.     Right lower leg: No edema.     Left lower leg: No edema.  Lymphadenopathy:     Cervical: No cervical adenopathy.     Lower Body: No right inguinal adenopathy. No left inguinal adenopathy.  Skin:    General: Skin is warm and dry.  Neurological:     General: No focal deficit present.     Mental Status: He is alert. Mental status is at baseline.     Lab Results  Component Value Date   WBC 7.1 07/04/2022   HGB 14.1 07/04/2022   HCT 42.5 07/04/2022   PLT 232.0 07/04/2022   GLUCOSE 85 07/04/2022   CHOL 162 07/04/2022   TRIG 42.0 07/04/2022   HDL 71.40 07/04/2022   LDLCALC 82 07/04/2022   ALT 20 07/04/2022   AST 20 07/04/2022   NA 138 07/04/2022   K 3.9 07/04/2022   CL 98 07/04/2022   CREATININE 0.87 07/04/2022   BUN 14 07/04/2022   CO2 33 (H) 07/04/2022   TSH 1.81 07/04/2022   PSA 1.47 07/04/2022   INR 1.0 08/30/2019    DG C-Arm 1-60 Min  Result Date: 08/30/2019 CLINICAL DATA:  C-spine fusion EXAM: CERVICAL SPINE - 2-3 VIEW; DG C-ARM 1-60 MIN COMPARISON:  MRI 08/26/2019 FINDINGS: Three low resolution intraoperative spot views of the cervical spine. Total fluoroscopy time was 7 seconds. Initial image demonstrates localizing instrument anterior to C4. Subsequent images demonstrate placement of surgical plate and fixating screws with interbody device at C3-C4. IMPRESSION: Intraoperative fluoroscopic assistance provided during cervical spine surgery. Electronically Signed   By: Donavan Foil M.D.   On: 08/30/2019 16:48   DG Cervical Spine 2-3 Views  Result Date: 08/30/2019 CLINICAL DATA:  C-spine fusion EXAM: CERVICAL SPINE - 2-3 VIEW; DG C-ARM 1-60 MIN COMPARISON:  MRI 08/26/2019 FINDINGS: Three low resolution intraoperative spot views of the cervical spine. Total fluoroscopy time was 7 seconds. Initial  image demonstrates localizing instrument anterior to C4. Subsequent images demonstrate placement of surgical plate and fixating screws with interbody device at C3-C4. IMPRESSION: Intraoperative fluoroscopic assistance provided during cervical spine surgery. Electronically Signed   By: Donavan Foil M.D.   On: 08/30/2019 16:48   Chest 2 View  Result Date: 08/30/2019 CLINICAL DATA:  Preoperative study for cervical fusion. EXAM: CHEST - 2 VIEW COMPARISON:  None. FINDINGS: The heart size and mediastinal contours are within normal limits. Normal pulmonary vascularity. No focal consolidation, pleural effusion, or  pneumothorax. No acute osseous abnormality. IMPRESSION: No active cardiopulmonary disease. Electronically Signed   By: Titus Dubin M.D.   On: 08/30/2019 12:22    Assessment & Plan:   Benign prostatic hyperplasia without lower urinary tract symptoms- PSA is normal. -     PSA; Future -     Urinalysis, Routine w reflex microscopic; Future  Chronic bilateral low back pain without sciatica -     Ambulatory referral to Physical Therapy  Diuretic-induced hypokalemia -     Basic metabolic panel; Future  Essential hypertension- His BP is well controlled. -     TSH; Future -     Hepatic function panel; Future -     CBC with Differential/Platelet; Future -     Basic metabolic panel; Future -     EKG 12-Lead -     Carvedilol; Take 1 tablet (6.25 mg total) by mouth 2 (two) times daily with a meal.  Dispense: 180 tablet; Refill: 1 -     Indapamide; Take 1 tablet (1.25 mg total) by mouth daily.  Dispense: 90 tablet; Refill: 1  Hyperlipidemia LDL goal <70 - LDL goal achieved. Doing well on the statin  -     Lipid panel; Future -     TSH; Future -     Hepatic function panel; Future -     CK; Future -     Atorvastatin Calcium; Take 1 tablet (10 mg total) by mouth daily.  Dispense: 90 tablet; Refill: 1  Encounter for general adult medical examination with abnormal findings- Exam completed,  labs reviewed, vaccines reviewed and updated, cancer screenings are up-to-date, patient education was given.     Follow-up: Return in about 6 months (around 01/04/2023).  Scarlette Calico, MD

## 2022-07-15 ENCOUNTER — Ambulatory Visit: Payer: Medicare Other | Attending: Internal Medicine

## 2022-07-15 DIAGNOSIS — M2560 Stiffness of unspecified joint, not elsewhere classified: Secondary | ICD-10-CM | POA: Diagnosis not present

## 2022-07-15 DIAGNOSIS — R269 Unspecified abnormalities of gait and mobility: Secondary | ICD-10-CM | POA: Insufficient documentation

## 2022-07-15 DIAGNOSIS — M545 Low back pain, unspecified: Secondary | ICD-10-CM | POA: Insufficient documentation

## 2022-07-15 DIAGNOSIS — M6281 Muscle weakness (generalized): Secondary | ICD-10-CM | POA: Insufficient documentation

## 2022-07-15 DIAGNOSIS — G8929 Other chronic pain: Secondary | ICD-10-CM | POA: Insufficient documentation

## 2022-07-15 DIAGNOSIS — R278 Other lack of coordination: Secondary | ICD-10-CM | POA: Diagnosis not present

## 2022-07-15 DIAGNOSIS — R262 Difficulty in walking, not elsewhere classified: Secondary | ICD-10-CM | POA: Diagnosis not present

## 2022-07-15 NOTE — Therapy (Signed)
OUTPATIENT PHYSICAL THERAPY THORACOLUMBAR EVALUATION   Patient Name: Larry Blair MRN: [id number] DOB:Oct 16, 1955, 67 y.o., male Today's Date: 07/15/2022  END OF SESSION:  PT End of Session - 07/15/22 1533     Visit Number 1    Number of Visits 24    Date for PT Re-Evaluation 10/07/22    PT Start Time 1518    Equipment Utilized During Treatment Gait belt    Activity Tolerance Patient tolerated treatment well;No increased pain    Behavior During Therapy WFL for tasks assessed/performed             Past Medical History:  Diagnosis Date   Allergy    Arthritis    right shoulder   Asthma    Eczema    HTN (hypertension)    Hyperlipidemia    Substance abuse 1976   daily   Past Surgical History:  Procedure Laterality Date   ANTERIOR CERVICAL DECOMP/DISCECTOMY FUSION N/A 08/30/2019   Procedure: CERVICAL THREE-FOUR ANTERIOR CERVICAL DECOMPRESSION/FUSION;  Surgeon: Tia Alert, MD;  Location: [facility] OR;  Service: Neurosurgery;  Laterality: N/A;   COLONOSCOPY     KNEE ARTHROSCOPY Left    Patient Active Problem List   Diagnosis Date Noted   Encounter for general adult medical examination with abnormal findings 07/04/2022   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 Sanda Linger  REFERRING PROVIDER: Dr. Sanda Linger  REFERRING DIAG: Chronic bilateral Low back pain without Sciatica  Rationale for Evaluation and Treatment: Rehabilitation  THERAPY DIAG:  Chronic bilateral low back pain without sciatica  Joint stiffness  Muscle  weakness (generalized)  Difficulty in walking  Abnormality of gait and mobility  Other lack of coordination  ONSET DATE: 08/27/2022  SUBJECTIVE:                                                                                                                                                                                           SUBJECTIVE STATEMENT: Patient reports he is here due to chronic low back pain and difficulty with walking. States her was receiving some outpatient PT in [place] in 2023 with good results but states had to stop as his wife was receiving PT at hospital and just too much  back and forth. He reports his issues stemmed from a fall in 2021 with "bruised spinal cord." Reports his low back pain comes and goes.   PERTINENT HISTORY:  Fall with neck surgery - started it all - damaged vertebrae in my neck- 2021.- s/p ACDF procedure, remote knee scope.   PAIN:  Are you having pain? Yes: NPRS scale: Current= 0/10 and at best; worst = 8/10 Pain location: lower back (right side mostly)  Pain description: ache Aggravating factors: increased time on feet- sore by end of day Relieving factors: Rest, Tylenol, heat occasionally  PRECAUTIONS: Fall  WEIGHT BEARING RESTRICTIONS: No  FALLS:  Has patient fallen in last 6 months? 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   NEXT MD VISIT: No follow up  OBJECTIVE:   DIAGNOSTIC FINDINGS:  CLINICAL DATA:  C-spine fusion   EXAM: CERVICAL SPINE - 2-3 VIEW; DG C-ARM 1-60 MIN   COMPARISON:  MRI 08/26/2019   FINDINGS: Three low resolution intraoperative  spot views of the cervical spine. Total fluoroscopy time was 7 seconds.   Initial image demonstrates localizing instrument anterior to C4. Subsequent images demonstrate placement of surgical plate and fixating screws with interbody device at C3-C4.   IMPRESSION: Intraoperative fluoroscopic assistance provided during cervical spine surgery.     Electronically Signed   By: Jasmine Pang M.D.   On: 08/30/2019 16:48    PATIENT SURVEYS:  Modified Oswestry 18  FOTO 47 with goal of 60  SCREENING FOR RED FLAGS: Bowel or bladder incontinence: No Spinal tumors: No Cauda equina syndrome: No Compression fracture: No Abdominal aneurysm: No  COGNITION: Overall cognitive status: Within functional limits for tasks assessed     SENSATION: Light touch: Impaired   MUSCLE LENGTH: Hamstrings: Right 90+ deg; Left 90+ deg   POSTURE: rounded shoulders and forward head  PALPATION: Tenderness throughout right side lumbar region  LUMBAR ROM:   AROM eval  Flexion Fingers to floor  Extension WNL  Right lateral flexion Fingers to mid calf  Left lateral flexion Fingers to mid calf  Right rotation WNL  Left rotation WNL   (Blank rows = not tested)  LOWER EXTREMITY ROM:     All B LE ROM= WNL   LOWER EXTREMITY MMT:    MMT Right eval Left eval  Hip flexion 3+ 3+  Hip extension 3+ 3+  Hip abduction 3+ 3+  Hip adduction 4 4  Hip internal rotation 3+ 3+  Hip external rotation 3+ 3+  Knee flexion 4 4  Knee extension 4 4  Ankle dorsiflexion 4 4  Ankle plantarflexion 4 4  Ankle inversion 4 4  Ankle eversion 4 4   (Blank rows = not tested)  LUMBAR SPECIAL TESTS:  Straight leg raise test: Negative, Slump test: Negative, Single leg stance test: To be tested next visit, and FABER test: Negative  FUNCTIONAL TESTS:  5 times sit to stand: 19.23 sec without UE support 6 minute walk test: To be evaluated next visit 10 meter walk test: 15.11 and 15.49 sec without an UE support = 15.3  sec or 0.65 m/s Berg Balance Scale: To be evaluated next visit  GAIT: Distance walked: approx 100 feet  Assistive device utilized: Single point cane Level of assistance: SBA Comments: Genu varus- excessive Left LE; right LE decreased heel strike with decreased step height and length, Foot adducted   TODAY'S TREATMENT:                                                                                                                              DATE: 07/15/2022- PT Evaluation    PATIENT EDUCATION:  Education details: PT plan of care; Review of previous HEP from 2023 Person educated: Patient Education method: Explanation, Demonstration, Tactile cues, and Verbal cues Education comprehension: verbalized understanding, returned demonstration, verbal cues required, tactile cues required, and needs further education  HOME EXERCISE PROGRAM: Reviewed from last episode of care- will add to HEP as appropriate.   ASSESSMENT:  CLINICAL IMPRESSION: Patient is a 67 y.o. male  who was seen today for physical therapy evaluation and treatment for chronic bilateral low back pain without sciatica. He presents with some intermittent report of low back pain- None during evaluation today along with some Bilateral LE Muscle weakness (mostly hip)  and difficulty with walking- at increased risk of falling as seen by 10 MWT test. Patient will benefit from skilled PT services to improve overall low back pain and restore functional strength and mobility to maximize his mobility and quality of life   OBJECTIVE IMPAIRMENTS: Abnormal gait, decreased activity tolerance, decreased balance, decreased endurance, decreased mobility, difficulty walking, decreased ROM, decreased strength, and pain.   ACTIVITY LIMITATIONS: carrying, lifting, bending, sitting, standing, squatting, stairs, transfers, and bed mobility  PARTICIPATION LIMITATIONS: cleaning, laundry, shopping, community activity, and yard work  PERSONAL FACTORS: Time  since onset of injury/illness/exacerbation and 1-2 comorbidities: HTN, Arthritis, past cervical ACDF procedure in 2021  are also affecting patient's functional outcome.   REHAB POTENTIAL: Good  CLINICAL DECISION MAKING: Stable/uncomplicated  EVALUATION COMPLEXITY: Moderate   GOALS: Goals reviewed with patient? Yes  SHORT TERM GOALS: Target date: 08/26/2022  Pt. Independent with HEP to increase B LE strength 1/2 muscle grade to improve standing/ independence with gait.  Baseline: Eval:  Goal status: INITIAL  2.  Pt will decrease worst back pain as reported on NPRS by at least 2 points in order to demonstrate clinically significant reduction in back pain.   Baseline: EVAL= up to 8/10 at worst Goal status: INITIAL   LONG TERM GOALS: Target date: 10/07/2022  Pt will improve FOTO to target score  of  60 or greater to display perceived improvements in ability to complete ADL's.  Baseline: EVAL= 47 Goal status: INITIAL  2.  Pt will decrease mODI score by at least 8 points in order demonstrate clinically significant reduction in back pain/disability.  Baseline: EVAL= 18 Goal status: INITIAL  3.  Pt will decrease worst back pain as reported on NPRS by at least 3 points in order to demonstrate clinically significant reduction in back pain.   Baseline: EVAL= Up to  8/10 low back pain Goal status: INITIAL  4.  Pt will improve BERG by at least 3 points in order to demonstrate clinically significant improvement in balance.   Baseline: EVAL: to be initiated next visit Goal status: INITIAL  5.  Pt will decrease 5TSTS by at least 3 seconds in order to demonstrate clinically significant improvement in LE strength Baseline: EVAL= 19.23 sec  Goal status: INITIAL  6.  Pt will increase 10MWT by at least 0.13 m/s in order to demonstrate clinically significant improvement in community ambulation.   Baseline: EVAL= 0.65 m/s without UE support  Goal status: INITIAL  PLAN:  PT FREQUENCY:  1-2x/week  PT DURATION: 12 weeks  PLANNED INTERVENTIONS: Therapeutic exercises, Therapeutic activity, Neuromuscular re-education, Balance training, Gait training, Patient/Family education, Self Care, Joint mobilization, Joint manipulation, Stair training, Vestibular training, Canalith repositioning, Orthotic/Fit training, DME instructions, Dry Needling, Spinal manipulation, Spinal mobilization, Cryotherapy, Moist heat, Taping, and Manual therapy.  PLAN FOR NEXT SESSION: Continue with evaluation of balance- BERG or most appropriate test; 6 min walk and implement pain relieving exercises.    Lenda KelpJeffrey N Demarquez Ciolek, PT 07/15/2022, 4:12 PM

## 2022-07-17 ENCOUNTER — Ambulatory Visit: Payer: Medicare Other

## 2022-07-17 DIAGNOSIS — M545 Low back pain, unspecified: Secondary | ICD-10-CM | POA: Diagnosis not present

## 2022-07-17 DIAGNOSIS — R278 Other lack of coordination: Secondary | ICD-10-CM | POA: Diagnosis not present

## 2022-07-17 DIAGNOSIS — G8929 Other chronic pain: Secondary | ICD-10-CM

## 2022-07-17 DIAGNOSIS — R262 Difficulty in walking, not elsewhere classified: Secondary | ICD-10-CM | POA: Diagnosis not present

## 2022-07-17 DIAGNOSIS — M2560 Stiffness of unspecified joint, not elsewhere classified: Secondary | ICD-10-CM

## 2022-07-17 DIAGNOSIS — R269 Unspecified abnormalities of gait and mobility: Secondary | ICD-10-CM | POA: Diagnosis not present

## 2022-07-17 DIAGNOSIS — M6281 Muscle weakness (generalized): Secondary | ICD-10-CM | POA: Diagnosis not present

## 2022-07-17 NOTE — Therapy (Signed)
OUTPATIENT PHYSICAL THERAPY THORACOLUMBAR VISIT   Patient Name: Cougar Enge MRN: [id number] DOB:1955/08/31, 67 y.o., male Today's Date: 07/17/2022  END OF SESSION:    Past Medical History:  Diagnosis Date   Allergy    Arthritis    right shoulder   Asthma    Eczema    HTN (hypertension)    Hyperlipidemia    Substance abuse 1976   daily   Past Surgical History:  Procedure Laterality Date   ANTERIOR CERVICAL DECOMP/DISCECTOMY FUSION N/A 08/30/2019   Procedure: CERVICAL THREE-FOUR ANTERIOR CERVICAL DECOMPRESSION/FUSION;  Surgeon: Tia Alert, MD;  Location: [facility] OR;  Service: Neurosurgery;  Laterality: N/A;   COLONOSCOPY     KNEE ARTHROSCOPY Left    Patient Active Problem List   Diagnosis Date Noted   Encounter for general adult medical examination with abnormal findings 07/04/2022   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 Sanda Linger  REFERRING PROVIDER: Dr. Sanda Linger  REFERRING DIAG: Chronic bilateral Low back pain without Sciatica  Rationale for Evaluation and Treatment: Rehabilitation  THERAPY DIAG:  No diagnosis found.  ONSET DATE: 08/27/2022  SUBJECTIVE:                                                                                                                                                                                           SUBJECTIVE STATEMENT: Patient reports having a decent day with no current pain right now.   PERTINENT HISTORY:  Fall with neck surgery - started it all - damaged vertebrae  in my neck- 2021.- s/p ACDF procedure, remote knee scope.   PAIN:  Are you having pain? Yes: NPRS scale: Current= 0/10 and at best; worst = 8/10 Pain location: lower back (right side mostly)  Pain description: ache Aggravating factors: increased time on feet- sore by end of day Relieving factors: Rest, Tylenol, heat occasionally  PRECAUTIONS: Fall  WEIGHT BEARING RESTRICTIONS: No  FALLS:  Has patient fallen in last 6 months? 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   NEXT MD VISIT: No follow up  OBJECTIVE:   DIAGNOSTIC FINDINGS:  CLINICAL DATA:  C-spine fusion   EXAM: CERVICAL SPINE - 2-3 VIEW; DG C-ARM 1-60 MIN   COMPARISON:  MRI 08/26/2019   FINDINGS: Three low resolution intraoperative spot views of the cervical spine. Total fluoroscopy time was 7 seconds.   Initial image demonstrates localizing instrument anterior to C4. Subsequent images demonstrate placement of surgical plate and fixating screws with interbody device at C3-C4.   IMPRESSION: Intraoperative fluoroscopic assistance provided during cervical spine surgery.     Electronically Signed   By: Jasmine Pang M.D.   On: 08/30/2019 16:48    PATIENT SURVEYS:  Modified Oswestry 18  FOTO 47 with goal of 60  SCREENING FOR RED FLAGS: Bowel or bladder incontinence: No Spinal tumors: No Cauda equina syndrome: No Compression fracture: No Abdominal aneurysm: No  COGNITION: Overall cognitive status: Within functional limits for tasks assessed     SENSATION: Light touch: Impaired   MUSCLE LENGTH: Hamstrings: Right 90+ deg;  Left 90+ deg   POSTURE: rounded shoulders and forward head  PALPATION: Tenderness throughout right side lumbar region  LUMBAR ROM:   AROM eval  Flexion Fingers to floor  Extension WNL  Right lateral flexion Fingers to mid calf  Left lateral flexion Fingers to mid calf  Right rotation WNL  Left rotation WNL   (Blank rows = not tested)  LOWER EXTREMITY ROM:     All B LE ROM= WNL   LOWER EXTREMITY MMT:    MMT Right eval Left eval  Hip flexion 3+ 3+  Hip extension 3+ 3+  Hip abduction 3+ 3+  Hip adduction 4 4  Hip internal rotation 3+ 3+  Hip external rotation 3+ 3+  Knee flexion 4 4  Knee extension 4 4  Ankle dorsiflexion 4 4  Ankle plantarflexion 4 4  Ankle inversion 4 4  Ankle eversion 4 4   (Blank rows = not tested)  LUMBAR SPECIAL TESTS:  Straight leg raise test: Negative, Slump test: Negative, Single leg stance test: To be tested next visit, and FABER test: Negative  FUNCTIONAL TESTS:  5 times sit to stand: 19.23 sec without UE support 6 minute walk test: To be evaluated next visit 10 meter walk test: 15.11 and 15.49 sec without an UE support = 15.3 sec or 0.65 m/s Berg Balance Scale: To be evaluated next visit  GAIT: Distance walked: approx 100 feet  Assistive device utilized: Single point cane Level of assistance: SBA Comments: Genu varus- excessive Left LE; right LE decreased heel strike with decreased step height and length, Foot adducted   TODAY'S TREATMENT:  DATE: 07/17/2022-    NMR:  FGA= 18/30  Static single leg stance= 3-7 sec on Right and 3-6 sec on leg with multiple attempts.  Discussion of heel to toe gait and observed shoe wear on plantar side - increased wear on toe box.   Therex:   6 min walk- 1050 feet with use of SPC  Ankle DF/PF x 12 reps  Resisted ankle DF with RTB 2 x 15 reps   PATIENT EDUCATION:   Education details: PT plan of care; Review of previous HEP from 2023 Person educated: Patient Education method: Explanation, Demonstration, Tactile cues, and Verbal cues Education comprehension: verbalized understanding, returned demonstration, verbal cues required, tactile cues required, and needs further education  HOME EXERCISE PROGRAM: Reviewed from last episode of care- will add to HEP as appropriate.   ASSESSMENT:  CLINICAL IMPRESSION: Treatment consisted of continue with functional outcome measure- Patient presented with decreased distance with 6 min walk compared to norms. He continues to exhibit difficulty with walking- walking on toe box vs. Heel to toe and will benefit from further gait/balance training. He also presents with impaired balance scoring an 18/30 on FGA indicating risk of falling. He has good rehab potential to improve these impairments. Pain well controlled at this point.  Patient will benefit from skilled PT services to improve overall low back pain and restore functional strength and mobility to maximize his mobility and quality of life   OBJECTIVE IMPAIRMENTS: Abnormal gait, decreased activity tolerance, decreased balance, decreased endurance, decreased mobility, difficulty walking, decreased ROM, decreased strength, and pain.   ACTIVITY LIMITATIONS: carrying, lifting, bending, sitting, standing, squatting, stairs, transfers, and bed mobility  PARTICIPATION LIMITATIONS: cleaning, laundry, shopping, community activity, and yard work  PERSONAL FACTORS: Time since onset of injury/illness/exacerbation and 1-2 comorbidities: HTN, Arthritis, past cervical ACDF procedure in 2021  are also affecting patient's functional outcome.   REHAB POTENTIAL: Good  CLINICAL DECISION MAKING: Stable/uncomplicated  EVALUATION COMPLEXITY: Moderate   GOALS: Goals reviewed with patient? Yes  SHORT TERM GOALS: Target date: 08/26/2022  Pt. Independent with HEP to increase B LE  strength 1/2 muscle grade to improve standing/ independence with gait.  Baseline: Eval:  Goal status: INITIAL  2.  Pt will decrease worst back pain as reported on NPRS by at least 2 points in order to demonstrate clinically significant reduction in back pain.   Baseline: EVAL= up to 8/10 at worst Goal status: INITIAL   LONG TERM GOALS: Target date: 10/07/2022  Pt will improve FOTO to target score  of  60 or greater to display perceived improvements in ability to complete ADL's.  Baseline: EVAL= 47 Goal status: INITIAL  2.  Pt will decrease mODI score by at least 8 points in order demonstrate clinically significant reduction in back pain/disability.  Baseline: EVAL= 18 Goal status: INITIAL  3.  Pt will decrease worst back pain as reported on NPRS by at least 3 points in order to demonstrate clinically significant reduction in back pain.   Baseline: EVAL= Up to 8/10 low back pain Goal status: INITIAL  4.  Pt will improve FGA by at least 3 points in order to demonstrate clinically significant improvement in balance.   Baseline: EVAL: to be initiated next visit; 07/17/2022= 18/30 Goal status: INITIAL  5.  Pt will decrease 5TSTS by at least 3 seconds in order to demonstrate clinically significant improvement in LE strength Baseline: EVAL= 19.23 sec  Goal status: INITIAL  6.  Pt will increase by at least 0.13 m/s  in order to demonstrate clinically significant improvement in community ambulation.   Baseline: EVAL= 0.65 m/s without UE support  Goal status: INITIAL  7. Patient will improved 6 min walk test > 150 feet for improved gait efficiency and improved overall gait endurance with community activities.    Baseline: 07/17/2022= 1050 feet with cane   Goal status: INITIAL  PLAN:  PT FREQUENCY: 1-2x/week  PT DURATION: 12 weeks  PLANNED INTERVENTIONS: Therapeutic exercises, Therapeutic activity, Neuromuscular re-education, Balance training, Gait training, Patient/Family  education, Self Care, Joint mobilization, Joint manipulation, Stair training, Vestibular training, Canalith repositioning, Orthotic/Fit training, DME instructions, Dry Needling, Spinal manipulation, Spinal mobilization, Cryotherapy, Moist heat, Taping, and Manual therapy.  PLAN FOR NEXT SESSION: Continue with balance, Therex, and gait training next visit.   Lenda KelpJeffrey N Basia Mcginty, PT 07/17/2022, 9:37 AM

## 2022-07-22 ENCOUNTER — Ambulatory Visit: Payer: Medicare Other

## 2022-07-22 DIAGNOSIS — M6281 Muscle weakness (generalized): Secondary | ICD-10-CM

## 2022-07-22 DIAGNOSIS — R278 Other lack of coordination: Secondary | ICD-10-CM

## 2022-07-22 DIAGNOSIS — G8929 Other chronic pain: Secondary | ICD-10-CM | POA: Diagnosis not present

## 2022-07-22 DIAGNOSIS — R269 Unspecified abnormalities of gait and mobility: Secondary | ICD-10-CM | POA: Diagnosis not present

## 2022-07-22 DIAGNOSIS — R262 Difficulty in walking, not elsewhere classified: Secondary | ICD-10-CM

## 2022-07-22 DIAGNOSIS — M2560 Stiffness of unspecified joint, not elsewhere classified: Secondary | ICD-10-CM

## 2022-07-22 DIAGNOSIS — M545 Low back pain, unspecified: Secondary | ICD-10-CM | POA: Diagnosis not present

## 2022-07-22 NOTE — Therapy (Signed)
OUTPATIENT PHYSICAL THERAPY THORACOLUMBAR VISIT   Patient Name: Larry Blair MRN: [id number] DOB:10-Mar-1956, 67 y.o., male Today's Date: 07/24/2022  END OF SESSION:  PT End of Session - 07/23/22 0958     Visit Number 3    Number of Visits 24    Date for PT Re-Evaluation 10/07/22    Progress Note Due on Visit 10    PT Start Time 1515    PT Stop Time 1558    PT Time Calculation (min) 43 min    Equipment Utilized During Treatment Gait belt    Activity Tolerance Patient tolerated treatment well;No increased pain    Behavior During Therapy WFL for tasks assessed/performed              Past Medical History:  Diagnosis Date   Allergy    Arthritis    right shoulder   Asthma    Eczema    HTN (hypertension)    Hyperlipidemia    Substance abuse 1976   daily   Past Surgical History:  Procedure Laterality Date   ANTERIOR CERVICAL DECOMP/DISCECTOMY FUSION N/A 08/30/2019   Procedure: CERVICAL THREE-FOUR ANTERIOR CERVICAL DECOMPRESSION/FUSION;  Surgeon: Tia Alert, MD;  Location: [facility] OR;  Service: Neurosurgery;  Laterality: N/A;   COLONOSCOPY     KNEE ARTHROSCOPY Left    Patient Active Problem List   Diagnosis Date Noted   Encounter for general adult medical examination with abnormal findings 07/04/2022   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 Sanda Linger  REFERRING PROVIDER: Dr. Sanda Linger  REFERRING DIAG: Chronic bilateral Low back pain without Sciatica  Rationale for Evaluation and Treatment:  Rehabilitation  THERAPY DIAG:  Chronic bilateral low back pain without sciatica  Joint stiffness  Muscle weakness (generalized)  Difficulty in walking  Abnormality of gait and mobility  Other lack of coordination  ONSET DATE: 08/27/2022  SUBJECTIVE:                                                                                                                                                                                           SUBJECTIVE STATEMENT: Patient reports having a busy weekend - worked on [employer] and Friday and off on weekend but did some yardwork.  PERTINENT HISTORY:  Fall with neck surgery - started it all - damaged vertebrae in my neck- 2021.- s/p ACDF procedure, remote knee scope.   PAIN:  Are you having pain? Yes: NPRS scale: Current= 1/10 and at best; worst = 8/10 Pain location: lower back (right side mostly)  Pain description: ache Aggravating factors: increased time on feet- sore by end of day Relieving factors: Rest, Tylenol, heat occasionally  PRECAUTIONS: Fall  WEIGHT BEARING RESTRICTIONS: No  FALLS:  Has patient fallen in last 6 months? 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   NEXT MD VISIT: No follow up  OBJECTIVE:   DIAGNOSTIC FINDINGS:  CLINICAL DATA:  C-spine fusion   EXAM: CERVICAL SPINE - 2-3 VIEW; DG C-ARM 1-60 MIN   COMPARISON:  MRI 08/26/2019   FINDINGS: Three low resolution intraoperative spot views of the cervical spine. Total fluoroscopy time was 7 seconds.   Initial image demonstrates localizing instrument anterior to C4. Subsequent images  demonstrate placement of surgical plate and fixating screws with interbody device at C3-C4.   IMPRESSION: Intraoperative fluoroscopic assistance provided during cervical spine surgery.     Electronically Signed   By: Jasmine Pang M.D.   On: 08/30/2019 16:48    PATIENT SURVEYS:  Modified Oswestry 18  FOTO 47 with goal of 60  SCREENING FOR RED FLAGS: Bowel or bladder incontinence: No Spinal tumors: No Cauda equina syndrome: No Compression fracture: No Abdominal aneurysm: No  COGNITION: Overall cognitive status: Within functional limits for tasks assessed     SENSATION: Light touch: Impaired   MUSCLE LENGTH: Hamstrings: Right 90+ deg; Left 90+ deg   POSTURE: rounded shoulders and forward head  PALPATION: Tenderness throughout right side lumbar region  LUMBAR ROM:   AROM eval  Flexion Fingers to floor  Extension WNL  Right lateral flexion Fingers to mid calf  Left lateral flexion Fingers to mid calf  Right rotation WNL  Left rotation WNL   (Blank rows = not tested)  LOWER EXTREMITY ROM:     All B LE ROM= WNL   LOWER EXTREMITY MMT:    MMT Right eval Left eval  Hip flexion 3+ 3+  Hip extension 3+ 3+  Hip abduction 3+ 3+  Hip adduction 4 4  Hip internal rotation 3+ 3+  Hip external rotation 3+ 3+  Knee flexion 4 4  Knee extension 4 4  Ankle dorsiflexion 4 4  Ankle plantarflexion 4 4  Ankle inversion 4 4  Ankle eversion 4 4   (Blank rows = not tested)  LUMBAR SPECIAL TESTS:  Straight leg raise test: Negative, Slump test: Negative, Single leg stance test: To be tested next visit, and FABER test: Negative  FUNCTIONAL TESTS:  5 times sit to stand: 19.23 sec without UE support 6 minute walk test: To be evaluated next visit 10 meter walk test: 15.11 and 15.49 sec without an UE support = 15.3 sec or 0.65 m/s Berg Balance Scale: To be evaluated next visit  GAIT: Distance walked: approx 100 feet  Assistive device utilized: Single point cane Level  of assistance: SBA Comments: Genu varus- excessive Left LE; right LE decreased  heel strike with decreased step height and length, Foot adducted   TODAY'S TREATMENT:                                                                                                                              DATE: 07/22/2022-    NMR:   Heel to toe gait sequencing - Heel strike onto 1/2 foam roll x 15 reps each LE-  Progressed to adding 4# AW - same as above x15 reps each LE   Ambulation in // bars -with 4# AW- using mirror for feedback - heel to gait sequencing -down and back x10  Side step with 4# AW up/over 1/2 foam x 15 reps each way.    Therex:   Resisted gait 4# AW - using SPC  x 300 feet and later another 150 feet. Resisted 4# high knee march walk and retro step back in // bars.  Standing Ankle resisted  DF/PF (4# AW) 2 sets x 12 reps  Sit to stand without UE support 2 sets x 12 reps (no UE support)   PATIENT EDUCATION:  Education details: PT plan of care; Review of previous HEP from 2023 Person educated: Patient Education method: Explanation, Demonstration, Tactile cues, and Verbal cues Education comprehension: verbalized understanding, returned demonstration, verbal cues required, tactile cues required, and needs further education  HOME EXERCISE PROGRAM: Reviewed from last episode of care- will add to HEP as appropriate.   ASSESSMENT:  CLINICAL IMPRESSION: Patient able to present with more normalized heel strike with specific training and able to implement into walking using cane later in session with improved heel strike yet patient reverts back to baseline of foot drop with exertion. He performed well with all dynamic stepping and ankle strengthening with fatigue as limiting factor.  Patient will benefit from skilled PT services to improve overall low back pain and restore functional strength and mobility to maximize his mobility and quality of life   OBJECTIVE IMPAIRMENTS: Abnormal  gait, decreased activity tolerance, decreased balance, decreased endurance, decreased mobility, difficulty walking, decreased ROM, decreased strength, and pain.   ACTIVITY LIMITATIONS: carrying, lifting, bending, sitting, standing, squatting, stairs, transfers, and bed mobility  PARTICIPATION LIMITATIONS: cleaning, laundry, shopping, community activity, and yard work  PERSONAL FACTORS: Time since onset of injury/illness/exacerbation and 1-2 comorbidities: HTN, Arthritis, past cervical ACDF procedure in 2021  are also affecting patient's functional outcome.   REHAB POTENTIAL: Good  CLINICAL DECISION MAKING: Stable/uncomplicated  EVALUATION COMPLEXITY: Moderate   GOALS: Goals reviewed with patient? Yes  SHORT TERM GOALS: Target date: 08/26/2022  Pt. Independent with HEP to increase B LE strength 1/2 muscle grade to improve standing/ independence with gait.  Baseline: Eval:  Goal status: INITIAL  2.  Pt will decrease worst back pain as reported on NPRS by at least 2 points in order to demonstrate clinically significant reduction in back pain.   Baseline: EVAL= up to 8/10 at worst Goal status: INITIAL   LONG TERM  GOALS: Target date: 10/07/2022  Pt will improve FOTO to target score  of  60 or greater to display perceived improvements in ability to complete ADL's.  Baseline: EVAL= 47 Goal status: INITIAL  2.  Pt will decrease mODI score by at least 8 points in order demonstrate clinically significant reduction in back pain/disability.  Baseline: EVAL= 18 Goal status: INITIAL  3.  Pt will decrease worst back pain as reported on NPRS by at least 3 points in order to demonstrate clinically significant reduction in back pain.   Baseline: EVAL= Up to 8/10 low back pain Goal status: INITIAL  4.  Pt will improve FGA by at least 3 points in order to demonstrate clinically significant improvement in balance.   Baseline: EVAL: to be initiated next visit; 07/17/2022= 18/30 Goal status:  INITIAL  5.  Pt will decrease 5TSTS by at least 3 seconds in order to demonstrate clinically significant improvement in LE strength Baseline: EVAL= 19.23 sec  Goal status: INITIAL  6.  Pt will increase by at least 0.13 m/s in order to demonstrate clinically significant improvement in community ambulation.   Baseline: EVAL= 0.65 m/s without UE support  Goal status: INITIAL  7. Patient will improved 6 min walk test > 150 feet for improved gait efficiency and improved overall gait endurance with community activities.    Baseline: 07/17/2022= 1050 feet with cane   Goal status: INITIAL  PLAN:  PT FREQUENCY: 1-2x/week  PT DURATION: 12 weeks  PLANNED INTERVENTIONS: Therapeutic exercises, Therapeutic activity, Neuromuscular re-education, Balance training, Gait training, Patient/Family education, Self Care, Joint mobilization, Joint manipulation, Stair training, Vestibular training, Canalith repositioning, Orthotic/Fit training, DME instructions, Dry Needling, Spinal manipulation, Spinal mobilization, Cryotherapy, Moist heat, Taping, and Manual therapy.  PLAN FOR NEXT SESSION: Continue with balance, Therex, and gait training next visit.   Lenda Kelp, PT 07/24/2022, 10:02 AM

## 2022-07-24 ENCOUNTER — Ambulatory Visit: Payer: Medicare Other

## 2022-07-24 DIAGNOSIS — M6281 Muscle weakness (generalized): Secondary | ICD-10-CM | POA: Diagnosis not present

## 2022-07-24 DIAGNOSIS — G8929 Other chronic pain: Secondary | ICD-10-CM | POA: Diagnosis not present

## 2022-07-24 DIAGNOSIS — M2560 Stiffness of unspecified joint, not elsewhere classified: Secondary | ICD-10-CM

## 2022-07-24 DIAGNOSIS — R262 Difficulty in walking, not elsewhere classified: Secondary | ICD-10-CM

## 2022-07-24 DIAGNOSIS — M545 Low back pain, unspecified: Secondary | ICD-10-CM

## 2022-07-24 DIAGNOSIS — R269 Unspecified abnormalities of gait and mobility: Secondary | ICD-10-CM

## 2022-07-24 DIAGNOSIS — R278 Other lack of coordination: Secondary | ICD-10-CM

## 2022-07-24 NOTE — Therapy (Signed)
OUTPATIENT PHYSICAL THERAPY THORACOLUMBAR VISIT   Patient Name: Larry Blair MRN: [id number] DOB:02-Oct-1955, 67 y.o., male Today's Date: 07/24/2022  END OF SESSION:  PT End of Session - 07/24/22 1429     Visit Number 4    Number of Visits 24    Date for PT Re-Evaluation 10/07/22    Progress Note Due on Visit 10    PT Start Time 1428    PT Stop Time 1515    PT Time Calculation (min) 47 min    Equipment Utilized During Treatment Gait belt    Activity Tolerance Patient tolerated treatment well;No increased pain    Behavior During Therapy WFL for tasks assessed/performed               Past Medical History:  Diagnosis Date   Allergy    Arthritis    right shoulder   Asthma    Eczema    HTN (hypertension)    Hyperlipidemia    Substance abuse 1976   daily   Past Surgical History:  Procedure Laterality Date   ANTERIOR CERVICAL DECOMP/DISCECTOMY FUSION N/A 08/30/2019   Procedure: CERVICAL THREE-FOUR ANTERIOR CERVICAL DECOMPRESSION/FUSION;  Surgeon: Tia Alert, MD;  Location: [facility] OR;  Service: Neurosurgery;  Laterality: N/A;   COLONOSCOPY     KNEE ARTHROSCOPY Left    Patient Active Problem List   Diagnosis Date Noted   Encounter for general adult medical examination with abnormal findings 07/04/2022   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 Sanda Linger  REFERRING PROVIDER: Dr. Sanda Linger  REFERRING DIAG: Chronic bilateral Low back pain without Sciatica  Rationale for Evaluation and Treatment:  Rehabilitation  THERAPY DIAG:  Chronic bilateral low back pain without sciatica  Joint stiffness  Muscle weakness (generalized)  Difficulty in walking  Abnormality of gait and mobility  Other lack of coordination  ONSET DATE: 08/27/2022  SUBJECTIVE:                                                                                                                                                                                           SUBJECTIVE STATEMENT: Patient reports one Improvement he noticed was his ability to raise his left LE better to get on the  riding mower. Patient reports having a busy weekend - worked on [employer] and Friday and off on weekend but did some yardwork.   PERTINENT HISTORY:  Fall with neck surgery - started it all - damaged vertebrae in my neck- 2021.- s/p ACDF procedure, remote knee scope.   PAIN:  Are you having pain? Yes: NPRS scale: Current= 1/10 and at best; worst = 8/10 Pain location: lower back (right side mostly)  Pain description: ache Aggravating factors: increased time on feet- sore by end of day Relieving factors: Rest, Tylenol, heat occasionally  PRECAUTIONS: Fall  WEIGHT BEARING RESTRICTIONS: No  FALLS:  Has patient fallen in last 6 months? 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   NEXT MD VISIT: No follow up  OBJECTIVE:   DIAGNOSTIC FINDINGS:  CLINICAL DATA:  C-spine fusion   EXAM: CERVICAL SPINE - 2-3 VIEW; DG C-ARM 1-60 MIN   COMPARISON:  MRI 08/26/2019   FINDINGS: Three low resolution intraoperative spot views of the cervical spine. Total  fluoroscopy time was 7 seconds.   Initial image demonstrates localizing instrument anterior to C4. Subsequent images demonstrate placement of surgical plate and fixating screws with interbody device at C3-C4.   IMPRESSION: Intraoperative fluoroscopic assistance provided during cervical spine surgery.     Electronically Signed   By: Jasmine Pang M.D.   On: 08/30/2019 16:48    PATIENT SURVEYS:  Modified Oswestry 18  FOTO 47 with goal of 60  SCREENING FOR RED FLAGS: Bowel or bladder incontinence: No Spinal tumors: No Cauda equina syndrome: No Compression fracture: No Abdominal aneurysm: No  COGNITION: Overall cognitive status: Within functional limits for tasks assessed     SENSATION: Light touch: Impaired   MUSCLE LENGTH: Hamstrings: Right 90+ deg; Left 90+ deg   POSTURE: rounded shoulders and forward head  PALPATION: Tenderness throughout right side lumbar region  LUMBAR ROM:   AROM eval  Flexion Fingers to floor  Extension WNL  Right lateral flexion Fingers to mid calf  Left lateral flexion Fingers to mid calf  Right rotation WNL  Left rotation WNL   (Blank rows = not tested)  LOWER EXTREMITY ROM:     All B LE ROM= WNL   LOWER EXTREMITY MMT:    MMT Right eval Left eval  Hip flexion 3+ 3+  Hip extension 3+ 3+  Hip abduction 3+ 3+  Hip adduction 4 4  Hip internal rotation 3+ 3+  Hip external rotation 3+ 3+  Knee flexion 4 4  Knee extension 4 4  Ankle dorsiflexion 4 4  Ankle plantarflexion 4 4  Ankle inversion 4 4  Ankle eversion 4 4   (Blank rows = not tested)  LUMBAR SPECIAL TESTS:  Straight leg raise test: Negative, Slump test: Negative, Single leg stance test: To be tested next visit, and FABER test: Negative  FUNCTIONAL TESTS:  5 times sit to stand: 19.23 sec without UE support 6 minute walk test: To be evaluated next visit 10 meter walk test: 15.11 and 15.49 sec without an UE support = 15.3 sec or 0.65 m/s Berg Balance Scale: To  be evaluated next visit  GAIT: Distance  walked: approx 100 feet  Assistive device utilized: Single point cane Level of assistance: SBA Comments: Genu varus- excessive Left LE; right LE decreased heel strike with decreased step height and length, Foot adducted   TODAY'S TREATMENT:                                                                                                                              DATE: 07/24/2022-    NMR:   Resistive Hip march (green theraband tied across // bars) along with 3# AW without BUE Support  Side step in // bars down and back over 3 (1/2 foam rolls) x 6 trials without UE support  step in // bars down and back over 3 (1/2 foam rolls) x 6 trials without UE support       Therex:   Resistive Hip march (green theraband tied across // bars) along with 3# AW with BUE Support  Seated Ankle resisted  DF/PF (3# AW) 2 sets x 20 reps   Resisted gait 3# AW - using SPC  x 300 feet and later another 150 feet.   PROM To BLE and Lumbar region: Hold 30 sec x 3 each LE - Single Knee to chest - Lower trunk rotation -Piriformis -Hamstring -Sciatic nerve glide  PATIENT EDUCATION:  Education details: PT plan of care; Review of previous HEP from 2023 Person educated: Patient Education method: Explanation, Demonstration, Tactile cues, and Verbal cues Education comprehension: verbalized understanding, returned demonstration, verbal cues required, tactile cues required, and needs further education  HOME EXERCISE PROGRAM: Reviewed from last episode of care- will add to HEP as appropriate.   ASSESSMENT:  CLINICAL IMPRESSION: Patient performed well again today- improving with single leg balance and hip stability with initial difficulty but did improve with practice. No pain reported overall with today's stretching or therex. Improving heel strike again but requires concentration as this motion does not come natural.  Patient will benefit from skilled PT services  to improve overall low back pain and restore functional strength and mobility to maximize his mobility and quality of life   OBJECTIVE IMPAIRMENTS: Abnormal gait, decreased activity tolerance, decreased balance, decreased endurance, decreased mobility, difficulty walking, decreased ROM, decreased strength, and pain.   ACTIVITY LIMITATIONS: carrying, lifting, bending, sitting, standing, squatting, stairs, transfers, and bed mobility  PARTICIPATION LIMITATIONS: cleaning, laundry, shopping, community activity, and yard work  PERSONAL FACTORS: Time since onset of injury/illness/exacerbation and 1-2 comorbidities: HTN, Arthritis, past cervical ACDF procedure in 2021  are also affecting patient's functional outcome.   REHAB POTENTIAL: Good  CLINICAL DECISION MAKING: Stable/uncomplicated  EVALUATION COMPLEXITY: Moderate   GOALS: Goals reviewed with patient? Yes  SHORT TERM GOALS: Target date: 08/26/2022  Pt. Independent with HEP to increase B LE strength 1/2 muscle grade to improve standing/ independence with gait.  Baseline: Eval:  Goal status: INITIAL  2.  Pt will decrease worst back pain as reported on NPRS by at least 2 points in order  to demonstrate clinically significant reduction in back pain.   Baseline: EVAL= up to 8/10 at worst Goal status: INITIAL   LONG TERM GOALS: Target date: 10/07/2022  Pt will improve FOTO to target score  of  60 or greater to display perceived improvements in ability to complete ADL's.  Baseline: EVAL= 47 Goal status: INITIAL  2.  Pt will decrease mODI score by at least 8 points in order demonstrate clinically significant reduction in back pain/disability.  Baseline: EVAL= 18 Goal status: INITIAL  3.  Pt will decrease worst back pain as reported on NPRS by at least 3 points in order to demonstrate clinically significant reduction in back pain.   Baseline: EVAL= Up to 8/10 low back pain Goal status: INITIAL  4.  Pt will improve FGA by at least 3  points in order to demonstrate clinically significant improvement in balance.   Baseline: EVAL: to be initiated next visit; 07/17/2022= 18/30 Goal status: INITIAL  5.  Pt will decrease 5TSTS by at least 3 seconds in order to demonstrate clinically significant improvement in LE strength Baseline: EVAL= 19.23 sec  Goal status: INITIAL  6.  Pt will increase by at least 0.13 m/s in order to demonstrate clinically significant improvement in community ambulation.   Baseline: EVAL= 0.65 m/s without UE support  Goal status: INITIAL  7. Patient will improved 6 min walk test > 150 feet for improved gait efficiency and improved overall gait endurance with community activities.    Baseline: 07/17/2022= 1050 feet with cane   Goal status: INITIAL  PLAN:  PT FREQUENCY: 1-2x/week  PT DURATION: 12 weeks  PLANNED INTERVENTIONS: Therapeutic exercises, Therapeutic activity, Neuromuscular re-education, Balance training, Gait training, Patient/Family education, Self Care, Joint mobilization, Joint manipulation, Stair training, Vestibular training, Canalith repositioning, Orthotic/Fit training, DME instructions, Dry Needling, Spinal manipulation, Spinal mobilization, Cryotherapy, Moist heat, Taping, and Manual therapy.  PLAN FOR NEXT SESSION: Continue with balance, Therex, and gait training next visit.   Lenda Kelp, PT 07/24/2022, 3:27 PM

## 2022-07-29 ENCOUNTER — Ambulatory Visit: Payer: Medicare Other | Admitting: Physical Therapy

## 2022-07-29 DIAGNOSIS — R278 Other lack of coordination: Secondary | ICD-10-CM

## 2022-07-29 DIAGNOSIS — M2560 Stiffness of unspecified joint, not elsewhere classified: Secondary | ICD-10-CM

## 2022-07-29 DIAGNOSIS — M6281 Muscle weakness (generalized): Secondary | ICD-10-CM | POA: Diagnosis not present

## 2022-07-29 DIAGNOSIS — G8929 Other chronic pain: Secondary | ICD-10-CM | POA: Diagnosis not present

## 2022-07-29 DIAGNOSIS — R269 Unspecified abnormalities of gait and mobility: Secondary | ICD-10-CM

## 2022-07-29 DIAGNOSIS — R262 Difficulty in walking, not elsewhere classified: Secondary | ICD-10-CM

## 2022-07-29 DIAGNOSIS — M545 Low back pain, unspecified: Secondary | ICD-10-CM | POA: Diagnosis not present

## 2022-07-29 NOTE — Therapy (Signed)
OUTPATIENT PHYSICAL THERAPY THORACOLUMBAR VISIT   Patient Name: Larry Blair MRN: [id number] DOB:22-Jul-1955, 67 y.o., male Today's Date: 07/29/2022  END OF SESSION:  PT End of Session - 07/29/22 1524     Visit Number 5    Number of Visits 24    Date for PT Re-Evaluation 10/07/22    Progress Note Due on Visit 10    PT Start Time 1521    PT Stop Time 1601    PT Time Calculation (min) 40 min    Equipment Utilized During Treatment Gait belt    Activity Tolerance Patient tolerated treatment well;No increased pain    Behavior During Therapy WFL for tasks assessed/performed               Past Medical History:  Diagnosis Date   Allergy    Arthritis    right shoulder   Asthma    Eczema    HTN (hypertension)    Hyperlipidemia    Substance abuse 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   daily   Past Surgical History:  Procedure Laterality Date   ANTERIOR CERVICAL DECOMP/DISCECTOMY FUSION N/A 08/30/2019   Procedure: CERVICAL THREE-FOUR ANTERIOR CERVICAL DECOMPRESSION/FUSION;  Surgeon: Tia Alert, MD;  Location: [facility] OR;  Service: Neurosurgery;  Laterality: N/A;   COLONOSCOPY     KNEE ARTHROSCOPY Left    Patient Active Problem List   Diagnosis Date Noted   Encounter for general adult medical examination with abnormal findings 07/04/2022   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 Sanda Linger  REFERRING PROVIDER: Dr. Sanda Linger  REFERRING DIAG: Chronic bilateral Low back pain without Sciatica  Rationale for Evaluation and Treatment:  Rehabilitation  THERAPY DIAG:  Chronic bilateral low back pain without sciatica  Joint stiffness  Difficulty in walking  Abnormality of gait and mobility  Muscle weakness (generalized)  Other lack of coordination  ONSET DATE: 08/27/2022  SUBJECTIVE:                                                                                                                                                                                           SUBJECTIVE STATEMENT: Patient reports having busy weekend, with increased back pain/stiffness this PM.   PERTINENT HISTORY:  Fall with neck  surgery - started it all - damaged vertebrae in my neck- 2021.- s/p ACDF procedure, remote knee scope.   PAIN:  Are you having pain? Yes: NPRS scale: Current= 1/10 and at best; worst = 8/10 Pain location: lower back (right side mostly)  Pain description: ache Aggravating factors: increased time on feet- sore by end of day Relieving factors: Rest, Tylenol, heat occasionally  PRECAUTIONS: Fall  WEIGHT BEARING RESTRICTIONS: No  FALLS:  Has patient fallen in last 6 months? 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   NEXT MD VISIT: No follow up  OBJECTIVE:   DIAGNOSTIC FINDINGS:  CLINICAL DATA:  C-spine fusion   EXAM: CERVICAL SPINE - 2-3 VIEW; DG C-ARM 1-60 MIN   COMPARISON:  MRI 08/26/2019   FINDINGS: Three low resolution intraoperative spot views of the cervical spine. Total fluoroscopy time was 7 seconds.   Initial image demonstrates localizing instrument anterior to C4. Subsequent images demonstrate placement of  surgical plate and fixating screws with interbody device at C3-C4.   IMPRESSION: Intraoperative fluoroscopic assistance provided during cervical spine surgery.     Electronically Signed   By: Jasmine Pang M.D.   On: 08/30/2019 16:48    PATIENT SURVEYS:  Modified Oswestry 18  FOTO 47 with goal of 60  SCREENING FOR RED FLAGS: Bowel or bladder incontinence: No Spinal tumors: No Cauda equina syndrome: No Compression fracture: No Abdominal aneurysm: No  COGNITION: Overall cognitive status: Within functional limits for tasks assessed     SENSATION: Light touch: Impaired   MUSCLE LENGTH: Hamstrings: Right 90+ deg; Left 90+ deg   POSTURE: rounded shoulders and forward head  PALPATION: Tenderness throughout right side lumbar region  LUMBAR ROM:   AROM eval  Flexion Fingers to floor  Extension WNL  Right lateral flexion Fingers to mid calf  Left lateral flexion Fingers to mid calf  Right rotation WNL  Left rotation WNL   (Blank rows = not tested)  LOWER EXTREMITY ROM:     All B LE ROM= WNL   LOWER EXTREMITY MMT:    MMT Right eval Left eval  Hip flexion 3+ 3+  Hip extension 3+ 3+  Hip abduction 3+ 3+  Hip adduction 4 4  Hip internal rotation 3+ 3+  Hip external rotation 3+ 3+  Knee flexion 4 4  Knee extension 4 4  Ankle dorsiflexion 4 4  Ankle plantarflexion 4 4  Ankle inversion 4 4  Ankle eversion 4 4   (Blank rows = not tested)  LUMBAR SPECIAL TESTS:  Straight leg raise test: Negative, Slump test: Negative, Single leg stance test: To be tested next visit, and FABER test: Negative  FUNCTIONAL TESTS:  5 times sit to stand: 19.23 sec without UE support 6 minute walk test: To be evaluated next visit 10 meter walk test: 15.11 and 15.49 sec without an UE support = 15.3 sec or 0.65 m/s Berg Balance Scale: To be evaluated next visit  GAIT: Distance walked: approx 100 feet  Assistive device utilized: Single point cane Level of assistance:  SBA Comments: Genu varus- excessive Left LE; right LE decreased heel strike with decreased step height  and length, Foot adducted   TODAY'S TREATMENT:                                                                                                                              DATE: 07/24/2022-        Therex:  Gait with 3# ankle weight x 178ft. And SPC. Removed ankle weight gait x 369ft with SPC.  Side step with SPC 2 x 58ft bil with reduced use of SPC on second bout.  step in // bars down and back over 3 (1/2 foam rolls) x 6 trials without UE support Supine SLR AROM x 10 bil  LTR x 15 Bil  Hip abduction RTB x 15 BIL.   PROM To BLE and Lumbar region: Hold 30 sec x 3 each LE - Single Knee to chest -Piriformis -Hamstring   PATIENT EDUCATION:  Education details: PT plan of care; Review of previous HEP from 2023 Person educated: Patient Education method: Explanation, Demonstration, Tactile cues, and Verbal cues Education comprehension: verbalized understanding, returned demonstration, verbal cues required, tactile cues required, and needs further education  HOME EXERCISE PROGRAM: Reviewed from last episode of care- will add to HEP as appropriate.   ASSESSMENT:  CLINICAL IMPRESSION: Patient performed well again today- noted to have improved hip flexion with RLE limb advancement follwing supine manual therapy to reduce back stiffness. Improved control and step height with side stepping R and L on this day as well. Patient will benefit from skilled PT services to improve overall low back pain and restore functional strength and mobility to maximize his mobility and quality of life   OBJECTIVE IMPAIRMENTS: Abnormal gait, decreased activity tolerance, decreased balance, decreased endurance, decreased mobility, difficulty walking, decreased ROM, decreased strength, and pain.   ACTIVITY LIMITATIONS: carrying, lifting, bending, sitting, standing, squatting, stairs, transfers, and bed  mobility  PARTICIPATION LIMITATIONS: cleaning, laundry, shopping, community activity, and yard work  PERSONAL FACTORS: Time since onset of injury/illness/exacerbation and 1-2 comorbidities: HTN, Arthritis, past cervical ACDF procedure in 2021  are also affecting patient's functional outcome.   REHAB POTENTIAL: Good  CLINICAL DECISION MAKING: Stable/uncomplicated  EVALUATION COMPLEXITY: Moderate   GOALS: Goals reviewed with patient? Yes  SHORT TERM GOALS: Target date: 08/26/2022  Pt. Independent with HEP to increase B LE strength 1/2 muscle grade to improve standing/ independence with gait.  Baseline: Eval:  Goal status: INITIAL  2.  Pt will decrease worst back pain as reported on NPRS by at least 2 points in order to demonstrate clinically significant reduction in back pain.   Baseline: EVAL= up to 8/10 at worst Goal status: INITIAL   LONG TERM GOALS: Target date: 10/07/2022  Pt will improve FOTO to target score  of  60 or greater to display perceived improvements in ability to complete ADL's.  Baseline: EVAL= 47 Goal status: INITIAL  2.  Pt will decrease mODI score by at least 8 points in order demonstrate clinically significant reduction in back  pain/disability.  Baseline: EVAL= 18 Goal status: INITIAL  3.  Pt will decrease worst back pain as reported on NPRS by at least 3 points in order to demonstrate clinically significant reduction in back pain.   Baseline: EVAL= Up to 8/10 low back pain Goal status: INITIAL  4.  Pt will improve FGA by at least 3 points in order to demonstrate clinically significant improvement in balance.   Baseline: EVAL: to be initiated next visit; 07/17/2022= 18/30 Goal status: INITIAL  5.  Pt will decrease 5TSTS by at least 3 seconds in order to demonstrate clinically significant improvement in LE strength Baseline: EVAL= 19.23 sec  Goal status: INITIAL  6.  Pt will increase by at least 0.13 m/s in order to demonstrate clinically  significant improvement in community ambulation.   Baseline: EVAL= 0.65 m/s without UE support  Goal status: INITIAL  7. Patient will improved 6 min walk test > 150 feet for improved gait efficiency and improved overall gait endurance with community activities.    Baseline: 07/17/2022= 1050 feet with cane   Goal status: INITIAL  PLAN:  PT FREQUENCY: 1-2x/week  PT DURATION: 12 weeks  PLANNED INTERVENTIONS: Therapeutic exercises, Therapeutic activity, Neuromuscular re-education, Balance training, Gait training, Patient/Family education, Self Care, Joint mobilization, Joint manipulation, Stair training, Vestibular training, Canalith repositioning, Orthotic/Fit training, DME instructions, Dry Needling, Spinal manipulation, Spinal mobilization, Cryotherapy, Moist heat, Taping, and Manual therapy.  PLAN FOR NEXT SESSION:   Continue with balance, Therex, and gait training next visit. Manual therapy for LBP as needed   Grier Rocher PT, DPT  Physical Therapist - [facility]  [facility]  8:21 AM 07/30/22

## 2022-07-31 ENCOUNTER — Ambulatory Visit: Payer: Medicare Other | Admitting: Physical Therapy

## 2022-07-31 DIAGNOSIS — R278 Other lack of coordination: Secondary | ICD-10-CM | POA: Diagnosis not present

## 2022-07-31 DIAGNOSIS — R262 Difficulty in walking, not elsewhere classified: Secondary | ICD-10-CM

## 2022-07-31 DIAGNOSIS — G8929 Other chronic pain: Secondary | ICD-10-CM | POA: Diagnosis not present

## 2022-07-31 DIAGNOSIS — M545 Low back pain, unspecified: Secondary | ICD-10-CM | POA: Diagnosis not present

## 2022-07-31 DIAGNOSIS — M2560 Stiffness of unspecified joint, not elsewhere classified: Secondary | ICD-10-CM | POA: Diagnosis not present

## 2022-07-31 DIAGNOSIS — M6281 Muscle weakness (generalized): Secondary | ICD-10-CM | POA: Diagnosis not present

## 2022-07-31 DIAGNOSIS — R269 Unspecified abnormalities of gait and mobility: Secondary | ICD-10-CM

## 2022-07-31 NOTE — Therapy (Signed)
OUTPATIENT PHYSICAL THERAPY THORACOLUMBAR VISIT   Patient Name: Larry Blair MRN: [id number] DOB:03-Apr-1956, 67 y.o., male Today's Date: 07/31/2022  END OF SESSION:  PT End of Session - 07/31/22 1523     Visit Number 6    Number of Visits 24    Date for PT Re-Evaluation 10/07/22    Progress Note Due on Visit 10    PT Start Time 1523    PT Stop Time 1600    PT Time Calculation (min) 37 min    Equipment Utilized During Treatment Gait belt    Activity Tolerance Patient tolerated treatment well;No increased pain    Behavior During Therapy WFL for tasks assessed/performed               Past Medical History:  Diagnosis Date   Allergy    Arthritis    right shoulder   Asthma    Eczema    HTN (hypertension)    Hyperlipidemia    Substance abuse 1976   daily   Past Surgical History:  Procedure Laterality Date   ANTERIOR CERVICAL DECOMP/DISCECTOMY FUSION N/A 08/30/2019   Procedure: CERVICAL THREE-FOUR ANTERIOR CERVICAL DECOMPRESSION/FUSION;  Surgeon: Tia Alert, MD;  Location: [facility] OR;  Service: Neurosurgery;  Laterality: N/A;   COLONOSCOPY     KNEE ARTHROSCOPY Left    Patient Active Problem List   Diagnosis Date Noted   Encounter for general adult medical examination with abnormal findings 07/04/2022   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 Sanda Linger  REFERRING PROVIDER: Dr. Sanda Linger  REFERRING DIAG: Chronic bilateral Low back pain without Sciatica  Rationale for Evaluation and Treatment:  Rehabilitation  THERAPY DIAG:  Chronic bilateral low back pain without sciatica  Joint stiffness  Difficulty in walking  Abnormality of gait and mobility  Muscle weakness (generalized)  ONSET DATE: 08/27/2022  SUBJECTIVE:                                                                                                                                                                                           SUBJECTIVE STATEMENT: Patient reports that he is doing very well. No pain reported on this day.   PERTINENT HISTORY:  Fall with neck surgery -  started it all - damaged vertebrae in my neck- 2021.- s/p ACDF procedure, remote knee scope.   PAIN:  Are you having pain? Yes: NPRS scale: Current= 1/10 and at best; worst = 8/10 Pain location: lower back (right side mostly)  Pain description: ache Aggravating factors: increased time on feet- sore by end of day Relieving factors: Rest, Tylenol, heat occasionally  PRECAUTIONS: Fall  WEIGHT BEARING RESTRICTIONS: No  FALLS:  Has patient fallen in last 6 months? 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   NEXT MD VISIT: No follow up  OBJECTIVE:   DIAGNOSTIC FINDINGS:  CLINICAL DATA:  C-spine fusion   EXAM: CERVICAL SPINE - 2-3 VIEW; DG C-ARM 1-60 MIN   COMPARISON:  MRI 08/26/2019   FINDINGS: Three low resolution intraoperative spot views of the cervical spine. Total fluoroscopy time was 7 seconds.   Initial image demonstrates localizing instrument anterior to C4. Subsequent images demonstrate placement of surgical plate and fixating screws with  interbody device at C3-C4.   IMPRESSION: Intraoperative fluoroscopic assistance provided during cervical spine surgery.     Electronically Signed   By: Jasmine Pang M.D.   On: 08/30/2019 16:48    PATIENT SURVEYS:  Modified Oswestry 18  FOTO 47 with goal of 60  SCREENING FOR RED FLAGS: Bowel or bladder incontinence: No Spinal tumors: No Cauda equina syndrome: No Compression fracture: No Abdominal aneurysm: No  COGNITION: Overall cognitive status: Within functional limits for tasks assessed     SENSATION: Light touch: Impaired   MUSCLE LENGTH: Hamstrings: Right 90+ deg; Left 90+ deg   POSTURE: rounded shoulders and forward head  PALPATION: Tenderness throughout right side lumbar region  LUMBAR ROM:   AROM eval  Flexion Fingers to floor  Extension WNL  Right lateral flexion Fingers to mid calf  Left lateral flexion Fingers to mid calf  Right rotation WNL  Left rotation WNL   (Blank rows = not tested)  LOWER EXTREMITY ROM:     All B LE ROM= WNL   LOWER EXTREMITY MMT:    MMT Right eval Left eval  Hip flexion 3+ 3+  Hip extension 3+ 3+  Hip abduction 3+ 3+  Hip adduction 4 4  Hip internal rotation 3+ 3+  Hip external rotation 3+ 3+  Knee flexion 4 4  Knee extension 4 4  Ankle dorsiflexion 4 4  Ankle plantarflexion 4 4  Ankle inversion 4 4  Ankle eversion 4 4   (Blank rows = not tested)  LUMBAR SPECIAL TESTS:  Straight leg raise test: Negative, Slump test: Negative, Single leg stance test: To be tested next visit, and FABER test: Negative  FUNCTIONAL TESTS:  5 times sit to stand: 19.23 sec without UE support 6 minute walk test: To be evaluated next visit 10 meter walk test: 15.11 and 15.49 sec without an UE support = 15.3 sec or 0.65 m/s Berg Balance Scale: To be evaluated next visit  GAIT:  Distance walked: approx 100 feet  Assistive device utilized: Single point cane Level of assistance: SBA Comments: Genu varus- excessive Left LE;  right LE decreased heel strike with decreased step height and  length, Foot adducted   TODAY'S TREATMENT:                                                                                                                              DATE: 07/31/2022   Nustep level 5 x 4 min   Forward/reverse gait 4x 47ft Side stepping 4x 37ft bil .  Stepping over 4 canes in parallel bars 2 feet between each x 8 then 1 foot in each space x 8  Step up 4inch step x 6 bil Lateral step up 4inch step x 6bil.  CGA for safety with cues for improved hip and knee flexion with limb advancement and reduce circumduction.   Gait with SPC and 3# ankle weights x 330ft with supervision assist.     PATIENT EDUCATION:  Education details: PT plan of care; Review of previous HEP from 2023 Person educated: Patient Education method: Explanation, Demonstration, Tactile cues, and Verbal cues Education comprehension: verbalized understanding, returned demonstration, verbal cues required, tactile cues required, and needs further education  HOME EXERCISE PROGRAM: Reviewed from last episode of care- will add to HEP as appropriate.   ASSESSMENT:  CLINICAL IMPRESSION: Patient put forth excellent effort throughout PT treatment. PT treatment focused on dynamic balance and functional strength training. Decreased pain allowed improved gait pattern to reduce trunkal rotation and lateral flexion with bil limb advancement on this day. Patient will benefit from skilled PT services to improve overall low back pain and restore functional strength and mobility to maximize his mobility and quality of life   OBJECTIVE IMPAIRMENTS: Abnormal gait, decreased activity tolerance, decreased balance, decreased endurance, decreased mobility, difficulty walking, decreased ROM, decreased strength, and pain.   ACTIVITY LIMITATIONS: carrying, lifting, bending, sitting, standing, squatting, stairs, transfers, and bed mobility  PARTICIPATION LIMITATIONS:  cleaning, laundry, shopping, community activity, and yard work  PERSONAL FACTORS: Time since onset of injury/illness/exacerbation and 1-2 comorbidities: HTN, Arthritis, past cervical ACDF procedure in 2021  are also affecting patient's functional outcome.   REHAB POTENTIAL: Good  CLINICAL DECISION MAKING: Stable/uncomplicated  EVALUATION COMPLEXITY: Moderate   GOALS: Goals reviewed with patient? Yes  SHORT TERM GOALS: Target date: 08/26/2022  Pt. Independent with HEP to increase B LE strength 1/2 muscle grade to improve standing/ independence with gait.  Baseline: Eval:  Goal status: INITIAL  2.  Pt will decrease worst back pain as reported on NPRS by at least 2 points in order to demonstrate clinically significant reduction in back pain.   Baseline: EVAL= up to 8/10 at worst Goal status: INITIAL   LONG TERM GOALS: Target date: 10/07/2022  Pt will improve FOTO to target score  of  60 or greater to display perceived improvements in ability to complete ADL's.  Baseline: EVAL= 47 Goal status: INITIAL  2.  Pt will decrease mODI score by at least 8 points in order demonstrate clinically significant reduction in back pain/disability.  Baseline: EVAL= 18 Goal status: INITIAL  3.  Pt will  decrease worst back pain as reported on NPRS by at least 3 points in order to demonstrate clinically significant reduction in back pain.   Baseline: EVAL= Up to 8/10 low back pain Goal status: INITIAL  4.  Pt will improve FGA by at least 3 points in order to demonstrate clinically significant improvement in balance.   Baseline: EVAL: to be initiated next visit; 07/17/2022= 18/30 Goal status: INITIAL  5.  Pt will decrease 5TSTS by at least 3 seconds in order to demonstrate clinically significant improvement in LE strength Baseline: EVAL= 19.23 sec  Goal status: INITIAL  6.  Pt will increase by at least 0.13 m/s in order to demonstrate clinically significant improvement in community  ambulation.   Baseline: EVAL= 0.65 m/s without UE support  Goal status: INITIAL  7. Patient will improved 6 min walk test > 150 feet for improved gait efficiency and improved overall gait endurance with community activities.    Baseline: 07/17/2022= 1050 feet with cane   Goal status: INITIAL  PLAN:  PT FREQUENCY: 1-2x/week  PT DURATION: 12 weeks  PLANNED INTERVENTIONS: Therapeutic exercises, Therapeutic activity, Neuromuscular re-education, Balance training, Gait training, Patient/Family education, Self Care, Joint mobilization, Joint manipulation, Stair training, Vestibular training, Canalith repositioning, Orthotic/Fit training, DME instructions, Dry Needling, Spinal manipulation, Spinal mobilization, Cryotherapy, Moist heat, Taping, and Manual therapy.  PLAN FOR NEXT SESSION:   Continue with balance, Therex, and gait training next visit. Manual therapy for LBP as needed   Grier Rocher PT, DPT   Physical Therapist - [facility]  [facility]  4:47 PM 07/31/22

## 2022-08-05 ENCOUNTER — Ambulatory Visit: Payer: Medicare Other | Admitting: Physical Therapy

## 2022-08-05 DIAGNOSIS — R278 Other lack of coordination: Secondary | ICD-10-CM

## 2022-08-05 DIAGNOSIS — R262 Difficulty in walking, not elsewhere classified: Secondary | ICD-10-CM | POA: Diagnosis not present

## 2022-08-05 DIAGNOSIS — M6281 Muscle weakness (generalized): Secondary | ICD-10-CM | POA: Diagnosis not present

## 2022-08-05 DIAGNOSIS — G8929 Other chronic pain: Secondary | ICD-10-CM | POA: Diagnosis not present

## 2022-08-05 DIAGNOSIS — M2560 Stiffness of unspecified joint, not elsewhere classified: Secondary | ICD-10-CM

## 2022-08-05 DIAGNOSIS — R269 Unspecified abnormalities of gait and mobility: Secondary | ICD-10-CM | POA: Diagnosis not present

## 2022-08-05 DIAGNOSIS — M545 Low back pain, unspecified: Secondary | ICD-10-CM | POA: Diagnosis not present

## 2022-08-05 NOTE — Therapy (Signed)
OUTPATIENT PHYSICAL THERAPY THORACOLUMBAR VISIT   Patient Name: Larry Blair MRN: [id number] DOB:07-03-1955, 67 y.o., male Today's Date: 08/05/2022  END OF SESSION:  PT End of Session - 08/05/22 1523     Visit Number 7    Number of Visits 24    Date for PT Re-Evaluation 10/07/22    Progress Note Due on Visit 10    PT Start Time 1521    PT Stop Time 1602    PT Time Calculation (min) 41 min    Equipment Utilized During Treatment Gait belt    Activity Tolerance Patient tolerated treatment well;No increased pain    Behavior During Therapy WFL for tasks assessed/performed               Past Medical History:  Diagnosis Date   Allergy    Arthritis    right shoulder   Asthma    Eczema    HTN (hypertension)    Hyperlipidemia    Substance abuse (HCC) 1976   daily   Past Surgical History:  Procedure Laterality Date   ANTERIOR CERVICAL DECOMP/DISCECTOMY FUSION N/A 08/30/2019   Procedure: CERVICAL THREE-FOUR ANTERIOR CERVICAL DECOMPRESSION/FUSION;  Surgeon: Tia Alert, MD;  Location: [facility] OR;  Service: Neurosurgery;  Laterality: N/A;   COLONOSCOPY     KNEE ARTHROSCOPY Left    Patient Active Problem List   Diagnosis Date Noted   Encounter for general adult medical examination with abnormal findings 07/04/2022   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 Sanda Linger  REFERRING PROVIDER: Dr. Sanda Linger  REFERRING DIAG: Chronic bilateral Low back pain without Sciatica  Rationale for Evaluation and Treatment:  Rehabilitation  THERAPY DIAG:  Chronic bilateral low back pain without sciatica  Joint stiffness  Abnormality of gait and mobility  Muscle weakness (generalized)  Other lack of coordination  Difficulty in walking  ONSET DATE: 08/27/2022  SUBJECTIVE:                                                                                                                                                                                           SUBJECTIVE STATEMENT: Patient reports that he is doing very well. No pain reported on this day.   PERTINENT HISTORY:  Fall with neck surgery - started it all - damaged vertebrae in my neck- 2021.- s/p ACDF procedure, remote knee scope.   PAIN:  Are you having pain? Yes: NPRS scale: Current= 1/10 and at best; worst = 8/10 Pain location: lower back (right side mostly)  Pain description: ache Aggravating factors: increased time on feet- sore by end of day Relieving factors: Rest, Tylenol, heat occasionally  PRECAUTIONS: Fall  WEIGHT BEARING RESTRICTIONS: No  FALLS:  Has patient fallen in last 6 months? 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   NEXT MD VISIT: No follow up  OBJECTIVE:   DIAGNOSTIC FINDINGS:  CLINICAL DATA:  C-spine fusion   EXAM: CERVICAL SPINE - 2-3 VIEW; DG C-ARM 1-60 MIN   COMPARISON:  MRI 08/26/2019   FINDINGS: Three low resolution intraoperative spot views of the cervical spine. Total fluoroscopy time was 7 seconds.   Initial image demonstrates localizing instrument anterior to C4. Subsequent images demonstrate placement of surgical  plate and fixating screws with interbody device at C3-C4.   IMPRESSION: Intraoperative fluoroscopic assistance provided during cervical spine surgery.     Electronically Signed   By: Jasmine Pang M.D.   On: 08/30/2019 16:48    PATIENT SURVEYS:  Modified Oswestry 18  FOTO 47 with goal of 60  SCREENING FOR RED FLAGS: Bowel or bladder incontinence: No Spinal tumors: No Cauda equina syndrome: No Compression fracture: No Abdominal aneurysm: No  COGNITION: Overall cognitive status: Within functional limits for tasks assessed     SENSATION: Light touch: Impaired   MUSCLE LENGTH: Hamstrings: Right 90+ deg; Left 90+ deg   POSTURE: rounded shoulders and forward head  PALPATION: Tenderness throughout right side lumbar region  LUMBAR ROM:   AROM eval  Flexion Fingers to floor  Extension WNL  Right lateral flexion Fingers to mid calf  Left lateral flexion Fingers to mid calf  Right rotation WNL  Left rotation WNL   (Blank rows = not tested)  LOWER EXTREMITY ROM:     All B LE ROM= WNL   LOWER EXTREMITY MMT:    MMT Right eval Left eval  Hip flexion 3+ 3+  Hip extension 3+ 3+  Hip abduction 3+ 3+  Hip adduction 4 4  Hip internal rotation 3+ 3+  Hip external rotation 3+ 3+  Knee flexion 4 4  Knee extension 4 4  Ankle dorsiflexion 4 4  Ankle plantarflexion 4 4  Ankle inversion 4 4  Ankle eversion 4 4   (Blank rows = not tested)  LUMBAR SPECIAL TESTS:  Straight leg raise test: Negative, Slump test: Negative, Single leg stance test: To be tested next visit, and FABER test: Negative  FUNCTIONAL TESTS:  5 times sit to stand: 19.23 sec without UE support 6 minute walk test: To be evaluated next visit 10 meter walk test: 15.11 and 15.49 sec without an UE support = 15.3 sec or 0.65 m/s Berg Balance Scale: To be evaluated next visit  GAIT:  Distance walked: approx 100 feet  Assistive device utilized: Single point cane Level of assistance: SBA Comments:  Genu varus- excessive Left LE; right LE decreased heel strike  with decreased step height and length, Foot adducted   TODAY'S TREATMENT:                                                                                                                              DATE: 08/05/2022  Noted to have increased trunkal sway to advance the LLE with gait into rehab gym.   Manual therapy:  HS stretch contract relax 5 sec contract to 30 sec stretch x 3 bil.  Hip flexion 2 x 30 sec hold bil  Hip extension 2 x 35sec hold bil   Improved posture and decreased trunkal sway in gait following manual therapy.   Therex:  Supine:  LTR x 12 bil.  Standing:  Reciprocal march  x 10  HS curl x 10  Hip abduction  x 10  Hip extension x 12  Calf raise x 12  Seated:  Partial ab crunch from seat  2x 16  Isometric hold to resist trunkal rotation. 2X 10  Partial plank against rail to perform reciprocal shoulder tap x 12 bil.    PATIENT EDUCATION:  Education details: PT plan of care; Review of previous HEP from 2023 Person educated: Patient Education method: Explanation, Demonstration, Tactile cues, and Verbal cues Education comprehension: verbalized understanding, returned demonstration, verbal cues required, tactile cues required, and needs further education  HOME EXERCISE PROGRAM: Reviewed from last episode of care- will add to HEP as appropriate.   ASSESSMENT:  CLINICAL IMPRESSION: Patient put forth excellent effort throughout PT treatment. PT treatment focused on dynamic balance and functional strength training. Decreased pain allowed improved gait pattern to reduce trunkal rotation and lateral flexion with bil limb advancement on this day. Patient will benefit from skilled PT services to improve overall low back pain and restore functional strength and mobility to maximize his mobility and quality of life   OBJECTIVE IMPAIRMENTS: Abnormal gait, decreased activity tolerance, decreased balance, decreased  endurance, decreased mobility, difficulty walking, decreased ROM, decreased strength, and pain.   ACTIVITY LIMITATIONS: carrying, lifting, bending, sitting, standing, squatting, stairs, transfers, and bed mobility  PARTICIPATION LIMITATIONS: cleaning, laundry, shopping, community activity, and yard work  PERSONAL FACTORS: Time since onset of injury/illness/exacerbation and 1-2 comorbidities: HTN, Arthritis, past cervical ACDF procedure in 2021  are also affecting patient's functional outcome.   REHAB POTENTIAL: Good  CLINICAL DECISION MAKING: Stable/uncomplicated  EVALUATION COMPLEXITY: Moderate   GOALS: Goals reviewed with patient? Yes  SHORT TERM GOALS: Target date: 08/26/2022  Pt. Independent with HEP to increase B LE strength 1/2 muscle grade to improve standing/ independence with gait.  Baseline: Eval:  Goal status: INITIAL  2.  Pt will decrease worst back pain as reported on NPRS by at least 2 points in order to demonstrate clinically significant reduction in back pain.   Baseline: EVAL= up to 8/10 at worst Goal status: INITIAL   LONG TERM GOALS: Target date: 10/07/2022  Pt will improve FOTO to target score  of  60  or greater to display perceived improvements in ability to complete ADL's.  Baseline: EVAL= 47 Goal status: INITIAL  2.  Pt will decrease mODI score by at least 8 points in order demonstrate clinically significant reduction in back pain/disability.  Baseline: EVAL= 18 Goal status: INITIAL  3.  Pt will decrease worst back pain as reported on NPRS by at least 3 points in order to demonstrate clinically significant reduction in back pain.   Baseline: EVAL= Up to 8/10 low back pain Goal status: INITIAL  4.  Pt will improve FGA by at least 3 points in order to demonstrate clinically significant improvement in balance.   Baseline: EVAL: to be initiated next visit; 07/17/2022= 18/30 Goal status: INITIAL  5.  Pt will decrease 5TSTS by at least 3 seconds in order  to demonstrate clinically significant improvement in LE strength Baseline: EVAL= 19.23 sec  Goal status: INITIAL  6.  Pt will increase by at least 0.13 m/s in order to demonstrate clinically significant improvement in community ambulation.   Baseline: EVAL= 0.65 m/s without UE support  Goal status: INITIAL  7. Patient will improved 6 min walk test > 150 feet for improved gait efficiency and improved overall gait endurance with community activities.    Baseline: 07/17/2022= 1050 feet with cane   Goal status: INITIAL  PLAN:  PT FREQUENCY: 1-2x/week  PT DURATION: 12 weeks  PLANNED INTERVENTIONS: Therapeutic exercises, Therapeutic activity, Neuromuscular re-education, Balance training, Gait training, Patient/Family education, Self Care, Joint mobilization, Joint manipulation, Stair training, Vestibular training, Canalith repositioning, Orthotic/Fit training, DME instructions, Dry Needling, Spinal manipulation, Spinal mobilization, Cryotherapy, Moist heat, Taping, and Manual therapy.  PLAN FOR NEXT SESSION:   Continue with balance, Therex, and gait training next visit. Manual therapy for LBP as needed   Grier Rocher PT, DPT   Physical Therapist - [facility]  [facility]  5:11 PM 08/05/22

## 2022-08-05 NOTE — Therapy (Unsigned)
OUTPATIENT PHYSICAL THERAPY THORACOLUMBAR VISIT   Patient Name: Larry Blair MRN: [id number] DOB:04-16-1955, 67 y.o., male Today's Date: 08/07/2022  END OF SESSION:  PT End of Session - 08/07/22 1430     Visit Number 8    Number of Visits 24    Date for PT Re-Evaluation 10/07/22    Progress Note Due on Visit 10    PT Start Time 1430    PT Stop Time 1514    PT Time Calculation (min) 44 min    Equipment Utilized During Treatment Gait belt    Activity Tolerance Patient tolerated treatment well;No increased pain    Behavior During Therapy WFL for tasks assessed/performed                Past Medical History:  Diagnosis Date   Allergy    Arthritis    right shoulder   Asthma    Eczema    HTN (hypertension)    Hyperlipidemia    Substance abuse (HCC) 1976   daily   Past Surgical History:  Procedure Laterality Date   ANTERIOR CERVICAL DECOMP/DISCECTOMY FUSION N/A 08/30/2019   Procedure: CERVICAL THREE-FOUR ANTERIOR CERVICAL DECOMPRESSION/FUSION;  Surgeon: Tia Alert, MD;  Location: [facility] OR;  Service: Neurosurgery;  Laterality: N/A;   COLONOSCOPY     KNEE ARTHROSCOPY Left    Patient Active Problem List   Diagnosis Date Noted   Encounter for general adult medical examination with abnormal findings 07/04/2022   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 Sanda Linger  REFERRING PROVIDER: Dr. Sanda Linger  REFERRING DIAG: Chronic bilateral Low back pain without Sciatica  Rationale for Evaluation and Treatment:  Rehabilitation  THERAPY DIAG:  No diagnosis found.  ONSET DATE: 08/27/2022  SUBJECTIVE:                                                                                                                                                                                           SUBJECTIVE STATEMENT: Patient reports that he is doing very well. No pain reported on this day.   PERTINENT HISTORY:  Fall with neck surgery - started it all - damaged vertebrae in my neck- 2021.- s/p ACDF procedure, remote knee scope.   PAIN:  Are you having pain? Yes: NPRS scale: Current= 1/10 and at best; worst = 8/10 Pain location: lower back (right side mostly)  Pain description: ache Aggravating factors: increased time on feet- sore by end of day Relieving factors: Rest, Tylenol, heat occasionally  PRECAUTIONS: Fall  WEIGHT BEARING RESTRICTIONS: No  FALLS:  Has patient fallen in last 6 months? 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   NEXT MD VISIT: No follow up  OBJECTIVE:   DIAGNOSTIC FINDINGS:  CLINICAL DATA:  C-spine fusion   EXAM: CERVICAL SPINE - 2-3 VIEW; DG C-ARM 1-60 MIN   COMPARISON:  MRI 08/26/2019   FINDINGS: Three low resolution intraoperative spot views of the cervical spine. Total fluoroscopy time was 7 seconds.   Initial image demonstrates localizing instrument anterior to C4. Subsequent images demonstrate placement of surgical plate and fixating screws with interbody device at C3-C4.   IMPRESSION: Intraoperative fluoroscopic assistance provided during cervical spine surgery.      Electronically Signed   By: Jasmine Pang M.D.   On: 08/30/2019 16:48    PATIENT SURVEYS:  Modified Oswestry 18  FOTO 47 with goal of 60  SCREENING FOR RED FLAGS: Bowel or bladder incontinence: No Spinal tumors: No Cauda equina syndrome: No Compression fracture: No Abdominal aneurysm: No  COGNITION: Overall cognitive status: Within functional limits for tasks assessed     SENSATION: Light touch: Impaired   MUSCLE LENGTH: Hamstrings: Right 90+ deg; Left 90+ deg   POSTURE: rounded shoulders and forward head  PALPATION: Tenderness throughout right side lumbar region  LUMBAR ROM:   AROM eval  Flexion Fingers to floor  Extension WNL  Right lateral flexion Fingers to mid calf  Left lateral flexion Fingers to mid calf  Right rotation WNL  Left rotation WNL   (Blank rows = not tested)  LOWER EXTREMITY ROM:     All B LE ROM= WNL   LOWER EXTREMITY MMT:    MMT Right eval Left eval  Hip flexion 3+ 3+  Hip extension 3+ 3+  Hip abduction 3+ 3+  Hip adduction 4 4  Hip internal rotation 3+ 3+  Hip external rotation 3+ 3+  Knee flexion 4 4  Knee extension 4 4  Ankle dorsiflexion 4 4  Ankle plantarflexion 4 4  Ankle inversion 4 4  Ankle eversion 4 4   (Blank rows = not tested)  LUMBAR SPECIAL TESTS:  Straight leg raise test: Negative, Slump test: Negative, Single leg stance test: To be tested next visit, and FABER test: Negative  FUNCTIONAL TESTS:  5 times sit to stand: 19.23 sec without UE support 6 minute walk test: To be evaluated next visit 10 meter walk test: 15.11 and 15.49 sec without an UE support = 15.3 sec or 0.65 m/s Berg Balance Scale: To be evaluated next visit  GAIT:  Distance walked: approx 100 feet  Assistive device utilized: Single point cane Level of assistance: SBA Comments: Genu varus- excessive Left LE; right LE decreased heel strike with decreased step height and length, Foot adducted   TODAY'S TREATMENT:  DATE: 08/07/2022 TE  Nustep level 4 x 6 min   NMR Resisted walking each direction for balance, gait and LE strengthening X 3 ea direction with intermittent cane use and 12.5# resistance   LLE stability Taps on hedgehogs - x 2 min intervals with hedgehogs on 6 inch step ant to patient.   Ladder drill with focus on equal step length, intermittent cane use x 4 laps  - last time through ladder had pt focus on continuing to use ladder pattern and continue ambulating over level ground for carryover to gait.       PATIENT EDUCATION:  Education details: PT plan of care; Review of previous HEP from 2023 Person educated: Patient Education method: Explanation, Demonstration, Tactile cues, and Verbal cues Education comprehension: verbalized understanding, returned demonstration, verbal cues required, tactile cues required, and needs further education  HOME EXERCISE PROGRAM: Reviewed from last episode of care- will add to HEP as appropriate.   ASSESSMENT:  CLINICAL IMPRESSION:  Patient put forth excellent effort throughout PT treatment. PT treatment focused on dynamic balance and functional strength training. Worked on SLS stability and control with good success as well as work toward improving his foot clearance and gait mechanics with ladder training.  Patient will benefit from skilled PT services to improve overall low back pain and restore functional strength and mobility to maximize his mobility and quality of life.   OBJECTIVE IMPAIRMENTS: Abnormal gait, decreased activity tolerance, decreased balance, decreased endurance, decreased mobility, difficulty walking, decreased ROM, decreased strength, and pain.   ACTIVITY LIMITATIONS: carrying, lifting, bending, sitting, standing, squatting, stairs, transfers, and bed mobility  PARTICIPATION LIMITATIONS: cleaning, laundry, shopping, community  activity, and yard work  PERSONAL FACTORS: Time since onset of injury/illness/exacerbation and 1-2 comorbidities: HTN, Arthritis, past cervical ACDF procedure in 2021  are also affecting patient's functional outcome.   REHAB POTENTIAL: Good  CLINICAL DECISION MAKING: Stable/uncomplicated  EVALUATION COMPLEXITY: Moderate   GOALS: Goals reviewed with patient? Yes  SHORT TERM GOALS: Target date: 08/26/2022  Pt. Independent with HEP to increase B LE strength 1/2 muscle grade to improve standing/ independence with gait.  Baseline: Eval:  Goal status: INITIAL  2.  Pt will decrease worst back pain as reported on NPRS by at least 2 points in order to demonstrate clinically significant reduction in back pain.   Baseline: EVAL= up to 8/10 at worst Goal status: INITIAL   LONG TERM GOALS: Target date: 10/07/2022  Pt will improve FOTO to target score  of  60 or greater to display perceived improvements in ability to complete ADL's.  Baseline: EVAL= 47 Goal status: INITIAL  2.  Pt will decrease mODI score by at least 8 points in order demonstrate clinically significant reduction in back pain/disability.  Baseline: EVAL= 18 Goal status: INITIAL  3.  Pt will decrease worst back pain as reported on NPRS by at least 3 points in order to demonstrate clinically significant reduction in back pain.   Baseline: EVAL= Up to 8/10 low back pain Goal status: INITIAL  4.  Pt will improve FGA by at least 3 points in order to demonstrate clinically significant improvement in balance.   Baseline: EVAL: to be initiated next visit; 07/17/2022= 18/30 Goal status: INITIAL  5.  Pt will decrease 5TSTS by at least 3 seconds in order to demonstrate clinically significant improvement in LE strength Baseline: EVAL= 19.23 sec  Goal status: INITIAL  6.  Pt will increase by at least 0.13 m/s in order to demonstrate clinically significant  improvement in community ambulation.   Baseline: EVAL= 0.65 m/s without  UE support  Goal status: INITIAL  7. Patient will improved 6 min walk test > 150 feet for improved gait efficiency and improved overall gait endurance with community activities.    Baseline: 07/17/2022= 1050 feet with cane   Goal status: INITIAL  PLAN:  PT FREQUENCY: 1-2x/week  PT DURATION: 12 weeks  PLANNED INTERVENTIONS: Therapeutic exercises, Therapeutic activity, Neuromuscular re-education, Balance training, Gait training, Patient/Family education, Self Care, Joint mobilization, Joint manipulation, Stair training, Vestibular training, Canalith repositioning, Orthotic/Fit training, DME instructions, Dry Needling, Spinal manipulation, Spinal mobilization, Cryotherapy, Moist heat, Taping, and Manual therapy.  PLAN FOR NEXT SESSION:   Continue with balance, Therex, and gait training next visit. Manual therapy for LBP as needed   Norman Herrlich PT ,DPT Physical Therapist- [facility]  [facility]   4:13 PM 08/07/22

## 2022-08-07 ENCOUNTER — Ambulatory Visit: Payer: Medicare Other | Attending: Internal Medicine | Admitting: Physical Therapy

## 2022-08-07 DIAGNOSIS — M545 Low back pain, unspecified: Secondary | ICD-10-CM | POA: Insufficient documentation

## 2022-08-07 DIAGNOSIS — R2681 Unsteadiness on feet: Secondary | ICD-10-CM | POA: Diagnosis not present

## 2022-08-07 DIAGNOSIS — M2560 Stiffness of unspecified joint, not elsewhere classified: Secondary | ICD-10-CM | POA: Diagnosis not present

## 2022-08-07 DIAGNOSIS — R269 Unspecified abnormalities of gait and mobility: Secondary | ICD-10-CM | POA: Diagnosis not present

## 2022-08-07 DIAGNOSIS — G8929 Other chronic pain: Secondary | ICD-10-CM | POA: Insufficient documentation

## 2022-08-07 DIAGNOSIS — M6281 Muscle weakness (generalized): Secondary | ICD-10-CM | POA: Diagnosis not present

## 2022-08-07 DIAGNOSIS — R278 Other lack of coordination: Secondary | ICD-10-CM | POA: Insufficient documentation

## 2022-08-07 DIAGNOSIS — R262 Difficulty in walking, not elsewhere classified: Secondary | ICD-10-CM | POA: Insufficient documentation

## 2022-08-12 ENCOUNTER — Ambulatory Visit: Payer: Medicare Other

## 2022-08-12 DIAGNOSIS — R262 Difficulty in walking, not elsewhere classified: Secondary | ICD-10-CM | POA: Diagnosis not present

## 2022-08-12 DIAGNOSIS — R2681 Unsteadiness on feet: Secondary | ICD-10-CM | POA: Diagnosis not present

## 2022-08-12 DIAGNOSIS — G8929 Other chronic pain: Secondary | ICD-10-CM

## 2022-08-12 DIAGNOSIS — R278 Other lack of coordination: Secondary | ICD-10-CM | POA: Diagnosis not present

## 2022-08-12 DIAGNOSIS — M545 Low back pain, unspecified: Secondary | ICD-10-CM | POA: Diagnosis not present

## 2022-08-12 DIAGNOSIS — M6281 Muscle weakness (generalized): Secondary | ICD-10-CM | POA: Diagnosis not present

## 2022-08-12 DIAGNOSIS — M2560 Stiffness of unspecified joint, not elsewhere classified: Secondary | ICD-10-CM | POA: Diagnosis not present

## 2022-08-12 DIAGNOSIS — R269 Unspecified abnormalities of gait and mobility: Secondary | ICD-10-CM | POA: Diagnosis not present

## 2022-08-12 NOTE — Therapy (Signed)
OUTPATIENT PHYSICAL THERAPY THORACOLUMBAR VISIT   Patient Name: Larry Blair MRN: [id number] DOB:March 08, 1956, 67 y.o., male Today's Date: 08/12/2022  END OF SESSION:  PT End of Session - 08/12/22 1255     Visit Number 9    Number of Visits 24    Date for PT Re-Evaluation 10/07/22    Progress Note Due on Visit 10    PT Start Time 1258    PT Stop Time 1340    PT Time Calculation (min) 42 min    Equipment Utilized During Treatment Gait belt    Activity Tolerance Patient tolerated treatment well;No increased pain    Behavior During Therapy WFL for tasks assessed/performed                 Past Medical History:  Diagnosis Date   Allergy    Arthritis    right shoulder   Asthma    Eczema    HTN (hypertension)    Hyperlipidemia    Substance abuse (HCC) 1976   daily   Past Surgical History:  Procedure Laterality Date   ANTERIOR CERVICAL DECOMP/DISCECTOMY FUSION N/A 08/30/2019   Procedure: CERVICAL THREE-FOUR ANTERIOR CERVICAL DECOMPRESSION/FUSION;  Surgeon: Tia Alert, MD;  Location: [facility] OR;  Service: Neurosurgery;  Laterality: N/A;   COLONOSCOPY     KNEE ARTHROSCOPY Left    Patient Active Problem List   Diagnosis Date Noted   Encounter for general adult medical examination with abnormal findings 07/04/2022   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 Sanda Linger  REFERRING PROVIDER: Dr. 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 States able to work some  this weekend and felt better with less overall fatigue.    PERTINENT HISTORY:  Fall with neck surgery - started it all - damaged vertebrae in my neck- 2021.- s/p ACDF procedure, remote knee scope.   PAIN:  Are you having pain? Yes: NPRS scale: Current= 1/10 and at best; worst = 8/10 Pain location: lower back (right side mostly)  Pain description: ache Aggravating factors: increased time on feet- sore by end of day Relieving factors: Rest, Tylenol, heat occasionally  PRECAUTIONS: Fall  WEIGHT BEARING RESTRICTIONS: No  FALLS:  Has patient fallen in last 6 months? 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   NEXT MD VISIT: No follow up  OBJECTIVE:   DIAGNOSTIC FINDINGS:  CLINICAL DATA:  C-spine fusion   EXAM: CERVICAL SPINE - 2-3 VIEW; DG C-ARM 1-60 MIN   COMPARISON:  MRI 08/26/2019   FINDINGS: Three low resolution intraoperative spot views of the cervical spine. Total fluoroscopy time was 7 seconds.   Initial image demonstrates localizing instrument  anterior to C4. Subsequent images demonstrate placement of surgical plate and fixating screws with interbody device at C3-C4.   IMPRESSION: Intraoperative fluoroscopic assistance provided during cervical spine surgery.     Electronically Signed   By: Jasmine Pang M.D.   On: 08/30/2019 16:48    PATIENT SURVEYS:  Modified Oswestry 18  FOTO 47 with goal of 60  SCREENING FOR RED FLAGS: Bowel or bladder incontinence: No Spinal tumors: No Cauda equina syndrome: No Compression fracture: No Abdominal aneurysm: No  COGNITION: Overall cognitive status: Within functional limits for tasks assessed     SENSATION: Light touch: Impaired   MUSCLE LENGTH: Hamstrings: Right 90+ deg; Left 90+ deg   POSTURE: rounded shoulders and forward head  PALPATION: Tenderness throughout right side lumbar region  LUMBAR ROM:   AROM eval  Flexion Fingers to floor  Extension WNL  Right lateral flexion Fingers to mid calf  Left lateral flexion Fingers to mid calf  Right rotation WNL  Left rotation WNL   (Blank rows = not tested)  LOWER EXTREMITY ROM:     All B LE ROM= WNL   LOWER EXTREMITY MMT:    MMT Right eval Left eval  Hip flexion 3+ 3+  Hip extension 3+ 3+  Hip abduction 3+ 3+  Hip adduction 4 4  Hip internal rotation 3+ 3+  Hip external rotation 3+ 3+  Knee flexion 4 4  Knee extension 4 4  Ankle dorsiflexion 4 4  Ankle plantarflexion 4 4  Ankle inversion 4 4  Ankle eversion 4 4   (Blank rows = not tested)  LUMBAR SPECIAL TESTS:  Straight leg raise test: Negative, Slump test: Negative, Single leg stance test: To be tested next visit, and FABER test: Negative  FUNCTIONAL TESTS:  5 times sit to stand: 19.23 sec without UE support 6 minute walk test: To be evaluated next visit 10 meter walk test: 15.11 and 15.49 sec without an UE support = 15.3 sec or 0.65 m/s Berg Balance Scale: To be evaluated next visit  GAIT:  Distance walked: approx 100 feet  Assistive  device utilized: Single point cane  Level of assistance: SBA Comments: Genu varus- excessive Left LE; right LE decreased heel strike with decreased step height and length, Foot adducted   TODAY'S TREATMENT:                                                                                                                              DATE: 08/12/2022 TE    NMR: in // bars, CGA,   Dynamic high knee march on airex pad x 20 reps each LE  Forward/retro step up/over orange hurdle x 12 reps each LE  Dynamic step up onto 6" block x 10 reps each LE  Side step in // bars (from starting on airex pad- step then up/over orange hurdle, then up onto block) x 5 down and back  Ladder drill with focus on equal step length, intermittent cane use x 4 laps down and back-  - last time through ladder had pt focus on continuing to use ladder pattern and continue ambulating over level ground for carryover to gait x 100 feet- Some improvement with mostly cues for increased heel strike.   Widen gait activity- placed a 2X4 board in middle of // bars with instruction to walk length of bars to widen gait width during walking.   THEREX:   Progressive sit to stand (from edge of mat to chair)  24" x 3 23" x 3 reps 22" x 3 reps  21" x 3 reps Chair x 3 reps  Progressive Interval Nustep (LE only)  L1- 1 min HR= 82 bpm L4= 30 sec  L2 = 1 min L5= 30 sec (tougher- feel burn in quads)  L3= 1 min L6= 30 sec  L3=  30 sec  L7= 20 sec L5 = 20 sec L3= 20 sec   Total distance= 0.57mi Total time= Maintained > 80 SPM throughout Patient reported fatigue at end of session  PATIENT EDUCATION:  Education details: PT plan of care; Review of previous HEP from 2023 Person educated: Patient Education method: Explanation, Demonstration, Tactile cues, and Verbal cues Education comprehension: verbalized understanding, returned demonstration, verbal cues required, tactile cues required, and needs further education  HOME  EXERCISE PROGRAM: Reviewed from last episode of care- will add to HEP as appropriate.   ASSESSMENT:  CLINICAL IMPRESSION:  Treatment continued with dynamic balance interventions focused on improving step height, length and overall symmetry. Patient continues to respond well- adapting to exercise and despite some initial unsteadiness - was able to improve with practice during most activities. He does endorse and demonstrate some fatigue with Nustep and balance activities in bars but able to maintain above 80 SPM.  Patient will benefit from skilled PT services to improve overall low back pain and restore functional strength and mobility to maximize his mobility and quality of life.   OBJECTIVE IMPAIRMENTS: Abnormal gait, decreased activity tolerance, decreased balance, decreased endurance, decreased mobility, difficulty walking, decreased ROM, decreased strength, and pain.   ACTIVITY LIMITATIONS: carrying, lifting, bending,  sitting, standing, squatting, stairs, transfers, and bed mobility  PARTICIPATION LIMITATIONS: cleaning, laundry, shopping, community activity, and yard work  PERSONAL FACTORS: Time since onset of injury/illness/exacerbation and 1-2 comorbidities: HTN, Arthritis, past cervical ACDF procedure in 2021  are also affecting patient's functional outcome.   REHAB POTENTIAL: Good  CLINICAL DECISION MAKING: Stable/uncomplicated  EVALUATION COMPLEXITY: Moderate   GOALS: Goals reviewed with patient? Yes  SHORT TERM GOALS: Target date: 08/26/2022  Pt. Independent with HEP to increase B LE strength 1/2 muscle grade to improve standing/ independence with gait.  Baseline: Eval:  Goal status: INITIAL  2.  Pt will decrease worst back pain as reported on NPRS by at least 2 points in order to demonstrate clinically significant reduction in back pain.   Baseline: EVAL= up to 8/10 at worst Goal status: INITIAL   LONG TERM GOALS: Target date: 10/07/2022  Pt will improve FOTO to  target score  of  60 or greater to display perceived improvements in ability to complete ADL's.  Baseline: EVAL= 47 Goal status: INITIAL  2.  Pt will decrease mODI score by at least 8 points in order demonstrate clinically significant reduction in back pain/disability.  Baseline: EVAL= 18 Goal status: INITIAL  3.  Pt will decrease worst back pain as reported on NPRS by at least 3 points in order to demonstrate clinically significant reduction in back pain.   Baseline: EVAL= Up to 8/10 low back pain Goal status: INITIAL  4.  Pt will improve FGA by at least 3 points in order to demonstrate clinically significant improvement in balance.   Baseline: EVAL: to be initiated next visit; 07/17/2022= 18/30 Goal status: INITIAL  5.  Pt will decrease 5TSTS by at least 3 seconds in order to demonstrate clinically significant improvement in LE strength Baseline: EVAL= 19.23 sec  Goal status: INITIAL  6.  Pt will increase by at least 0.13 m/s in order to demonstrate clinically significant improvement in community ambulation.   Baseline: EVAL= 0.65 m/s without UE support  Goal status: INITIAL  7. Patient will improved 6 min walk test > 150 feet for improved gait efficiency and improved overall gait endurance with community activities.    Baseline: 07/17/2022= 1050 feet with cane   Goal status: INITIAL  PLAN:  PT FREQUENCY: 1-2x/week  PT DURATION: 12 weeks  PLANNED INTERVENTIONS: Therapeutic exercises, Therapeutic activity, Neuromuscular re-education, Balance training, Gait training, Patient/Family education, Self Care, Joint mobilization, Joint manipulation, Stair training, Vestibular training, Canalith repositioning, Orthotic/Fit training, DME instructions, Dry Needling, Spinal manipulation, Spinal mobilization, Cryotherapy, Moist heat, Taping, and Manual therapy.  PLAN FOR NEXT SESSION:   Continue with balance, Therex, and gait training next visit. Manual therapy for LBP as needed    Lenda Kelp PT  Physical Therapist- [facility]  [facility]   3:41 PM 08/12/22

## 2022-08-14 ENCOUNTER — Encounter: Payer: Self-pay | Admitting: Physical Therapy

## 2022-08-14 ENCOUNTER — Ambulatory Visit: Payer: Medicare Other | Admitting: Physical Therapy

## 2022-08-14 DIAGNOSIS — M6281 Muscle weakness (generalized): Secondary | ICD-10-CM

## 2022-08-14 DIAGNOSIS — R269 Unspecified abnormalities of gait and mobility: Secondary | ICD-10-CM | POA: Diagnosis not present

## 2022-08-14 DIAGNOSIS — G8929 Other chronic pain: Secondary | ICD-10-CM

## 2022-08-14 DIAGNOSIS — R2681 Unsteadiness on feet: Secondary | ICD-10-CM | POA: Diagnosis not present

## 2022-08-14 DIAGNOSIS — M545 Low back pain, unspecified: Secondary | ICD-10-CM | POA: Diagnosis not present

## 2022-08-14 DIAGNOSIS — R278 Other lack of coordination: Secondary | ICD-10-CM | POA: Diagnosis not present

## 2022-08-14 DIAGNOSIS — R262 Difficulty in walking, not elsewhere classified: Secondary | ICD-10-CM | POA: Diagnosis not present

## 2022-08-14 DIAGNOSIS — M2560 Stiffness of unspecified joint, not elsewhere classified: Secondary | ICD-10-CM | POA: Diagnosis not present

## 2022-08-14 NOTE — Therapy (Unsigned)
OUTPATIENT PHYSICAL THERAPY THORACOLUMBAR VISIT/ PROGRESS NOTE   Patient Name: Larry Blair MRN: [id number] DOB:09/27/1955, 67 y.o., male Today's Date: 08/14/2022  END OF SESSION:  PT End of Session - 08/14/22 1436     Visit Number 10    Number of Visits 24    Date for PT Re-Evaluation 10/07/22    Progress Note Due on Visit 10    PT Start Time 1433    PT Stop Time 1515    PT Time Calculation (min) 42 min    Equipment Utilized During Treatment Gait belt    Activity Tolerance Patient tolerated treatment well;No increased pain    Behavior During Therapy WFL for tasks assessed/performed                  Past Medical History:  Diagnosis Date   Allergy    Arthritis    right shoulder   Asthma    Eczema    HTN (hypertension)    Hyperlipidemia    Substance abuse (HCC) 1976   daily   Past Surgical History:  Procedure Laterality Date   ANTERIOR CERVICAL DECOMP/DISCECTOMY FUSION N/A 08/30/2019   Procedure: CERVICAL THREE-FOUR ANTERIOR CERVICAL DECOMPRESSION/FUSION;  Surgeon: Tia Alert, MD;  Location: [facility] OR;  Service: Neurosurgery;  Laterality: N/A;   COLONOSCOPY     KNEE ARTHROSCOPY Left    Patient Active Problem List   Diagnosis Date Noted   Encounter for general adult medical examination with abnormal findings 07/04/2022   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 Sanda Linger  REFERRING PROVIDER: Dr. Sanda Linger  REFERRING DIAG: Chronic bilateral Low back pain without Sciatica  Rationale for Evaluation and  Treatment: Rehabilitation  THERAPY DIAG:  Abnormality of gait and mobility  Muscle weakness (generalized)  Difficulty in walking  Chronic bilateral low back pain without sciatica  ONSET DATE: 08/27/2022  SUBJECTIVE:                                                                                                                                                                                           SUBJECTIVE STATEMENT:   Patient reports having a good weekend without any falls. States able to work with less pain following. He feels  functionally his step clearance has been improving and he is able to lift his feet with more ease.    PERTINENT HISTORY:  Fall with neck surgery - started it all - damaged vertebrae in my neck- 2021.- s/p ACDF procedure, remote knee scope.   PAIN:  Are you having pain? Yes: NPRS scale: Current= 1/10 and at best; worst = 8/10 Pain location: lower back (right side mostly)  Pain description: ache Aggravating factors: increased time on feet- sore by end of day Relieving factors: Rest, Tylenol, heat occasionally  PRECAUTIONS: Fall  WEIGHT BEARING RESTRICTIONS: No  FALLS:  Has patient fallen in last 6 months? 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   NEXT MD VISIT: No follow up  OBJECTIVE:   DIAGNOSTIC FINDINGS:  CLINICAL DATA:  C-spine fusion   EXAM: CERVICAL SPINE - 2-3 VIEW; DG C-ARM 1-60 MIN   COMPARISON:  MRI 08/26/2019   FINDINGS: Three low resolution intraoperative spot views of the cervical spine. Total fluoroscopy time was 7 seconds.   Initial image  demonstrates localizing instrument anterior to C4. Subsequent images demonstrate placement of surgical plate and fixating screws with interbody device at C3-C4.   IMPRESSION: Intraoperative fluoroscopic assistance provided during cervical spine surgery.     Electronically Signed   By: Jasmine Pang M.D.   On: 08/30/2019 16:48    PATIENT SURVEYS:  Modified Oswestry 18  FOTO 47 with goal of 60  SCREENING FOR RED FLAGS: Bowel or bladder incontinence: No Spinal tumors: No Cauda equina syndrome: No Compression fracture: No Abdominal aneurysm: No  COGNITION: Overall cognitive status: Within functional limits for tasks assessed     SENSATION: Light touch: Impaired   MUSCLE LENGTH: Hamstrings: Right 90+ deg; Left 90+ deg   POSTURE: rounded shoulders and forward head  PALPATION: Tenderness throughout right side lumbar region  LUMBAR ROM:   AROM eval  Flexion Fingers to floor  Extension WNL  Right lateral flexion Fingers to mid calf  Left lateral flexion Fingers to mid calf  Right rotation WNL  Left rotation WNL   (Blank rows = not tested)  LOWER EXTREMITY ROM:     All B LE ROM= WNL   LOWER EXTREMITY MMT:    MMT Right eval Left eval R 5/8 L5/8  Hip flexion 3+ 3+ 3+ 4-  Hip extension 3+ 3+    Hip abduction 3+ 3+    Hip adduction 4 4 4 4   Hip internal rotation 3+ 3+ 3+ 3+  Hip external rotation 3+ 3+ 4- 4-  Knee flexion 4 4 4 4   Knee extension 4 4 4+ 4+  Ankle dorsiflexion 4 4 4+ 4+  Ankle plantarflexion 4 4 4 4   Ankle inversion 4 4 4 4   Ankle eversion 4 4 4 4    (Blank rows = not tested)  LUMBAR SPECIAL TESTS:  Straight leg raise test: Negative, Slump test: Negative, Single leg stance test: To be tested next visit, and FABER test: Negative  FUNCTIONAL TESTS:  5 times sit to stand: 19.23 sec without UE support 6 minute walk test: To be evaluated next visit 10 meter walk test: 15.11 and  15.49 sec without an UE support = 15.3 sec or 0.65 m/s Berg  Balance Scale: To be evaluated next visit  GAIT:  Distance walked: approx 100 feet  Assistive device utilized: Single point cane Level of assistance: SBA Comments: Genu varus- excessive Left LE; right LE decreased heel strike with decreased step height and length, Foot adducted   TODAY'S TREATMENT:                                                                                                                              DATE: 08/14/2022 TE  Physical therapy treatment session today consisted of completing assessment of goals and administration of testing as demonstrated and documented in flow sheet, treatment, and goals section of this note. Addition treatments may be found below.    PATIENT EDUCATION:  Education details: PT plan of care; Review of previous HEP from 2023 Person educated: Patient Education method: Explanation, Demonstration, Tactile cues, and Verbal cues Education comprehension: verbalized understanding, returned demonstration, verbal cues required, tactile cues required, and needs further education  HOME EXERCISE PROGRAM: Reviewed from last episode of care- will add to HEP as appropriate.   ASSESSMENT:  CLINICAL IMPRESSION:    OBJECTIVE IMPAIRMENTS: Abnormal gait, decreased activity tolerance, decreased balance, decreased endurance, decreased mobility, difficulty walking, decreased ROM, decreased strength, and pain.   ACTIVITY LIMITATIONS: carrying, lifting, bending, sitting, standing, squatting, stairs, transfers, and bed mobility  PARTICIPATION LIMITATIONS: cleaning, laundry, shopping, community activity, and yard work  PERSONAL FACTORS: Time since onset of injury/illness/exacerbation and 1-2 comorbidities: HTN, Arthritis, past cervical ACDF procedure in 2021  are also affecting patient's functional outcome.   REHAB POTENTIAL: Good  CLINICAL DECISION MAKING: Stable/uncomplicated  EVALUATION COMPLEXITY: Moderate   GOALS: Goals reviewed with patient?  Yes  SHORT TERM GOALS: Target date: 08/26/2022  Pt. Independent with HEP to increase B LE strength 1/2 muscle grade to improve standing/ independence with gait.  Baseline: See chart in objective, several increases but some remain the same  Goal status: IN PROGRESS  2.  Pt will decrease worst back pain as reported on NPRS by at least 2 points in order to demonstrate clinically significant reduction in back pain.   Baseline: EVAL= up to 8/10 at worst 08/14/22: 3/10 at worst  Goal status: MET   LONG TERM GOALS: Target date: 10/07/2022  Pt will improve FOTO to target score  of  60 or greater to display perceived improvements in ability to complete ADL's.  Baseline: EVAL= 47 5/8: 56 Goal status: IN PROGRESS  2.  Pt will decrease MODI score by at least 8 points in order demonstrate clinically significant reduction in back pain/disability.  Baseline: EVAL= 18 5/8: 10 Goal status: IN PROGRESS  3.  Pt will decrease worst back pain as reported on NPRS by at least 3 points in order to demonstrate clinically significant reduction in back pain.   Baseline: EVAL= Up to 8/10 low back pain 08/14/22: 4/10 worst after work  Goal status: IN PROGRESS  4.  Pt will improve FGA by at least 3 points in order to demonstrate clinically significant improvement in balance.   Baseline: EVAL: to be initiated next visit; 07/17/2022= 18/30 08/14/22:19/30 Goal status: IN PROGRESS  5.  Pt will decrease 5TSTS by at least 3 seconds in order to demonstrate clinically significant improvement in LE strength Baseline: EVAL= 19.23 sec  08/14/22:14.38 sec Goal status: MET  6.  Pt will increase by at least 0.13 m/s in order to demonstrate clinically significant improvement in community ambulation.   Baseline: EVAL= 0.65 m/s without UE support  Goal status: MET   7. Patient will improved 6 min walk test > 150 feet for improved gait efficiency and improved overall gait endurance with community activities.    Baseline:  07/17/2022= 1050 feet with cane   Goal status: INITIAL  PLAN:  PT FREQUENCY: 1-2x/week  PT DURATION: 12 weeks  PLANNED INTERVENTIONS: Therapeutic exercises, Therapeutic activity, Neuromuscular re-education, Balance training, Gait training, Patient/Family education, Self Care, Joint mobilization, Joint manipulation, Stair training, Vestibular training, Canalith repositioning, Orthotic/Fit training, DME instructions, Dry Needling, Spinal manipulation, Spinal mobilization, Cryotherapy, Moist heat, Taping, and Manual therapy.  PLAN FOR NEXT SESSION:   Continue with balance, Therex, and gait training next visit. Manual therapy for LBP as needed   Norman Herrlich PT  Physical Therapist- [facility]  [facility]   2:36 PM 08/14/22

## 2022-08-19 ENCOUNTER — Ambulatory Visit: Payer: Medicare Other

## 2022-08-19 DIAGNOSIS — R269 Unspecified abnormalities of gait and mobility: Secondary | ICD-10-CM | POA: Diagnosis not present

## 2022-08-19 DIAGNOSIS — G8929 Other chronic pain: Secondary | ICD-10-CM | POA: Diagnosis not present

## 2022-08-19 DIAGNOSIS — M6281 Muscle weakness (generalized): Secondary | ICD-10-CM | POA: Diagnosis not present

## 2022-08-19 DIAGNOSIS — R262 Difficulty in walking, not elsewhere classified: Secondary | ICD-10-CM

## 2022-08-19 DIAGNOSIS — R278 Other lack of coordination: Secondary | ICD-10-CM | POA: Diagnosis not present

## 2022-08-19 DIAGNOSIS — M545 Low back pain, unspecified: Secondary | ICD-10-CM | POA: Diagnosis not present

## 2022-08-19 DIAGNOSIS — R2681 Unsteadiness on feet: Secondary | ICD-10-CM | POA: Diagnosis not present

## 2022-08-19 DIAGNOSIS — M2560 Stiffness of unspecified joint, not elsewhere classified: Secondary | ICD-10-CM | POA: Diagnosis not present

## 2022-08-19 NOTE — Therapy (Signed)
OUTPATIENT PHYSICAL THERAPY THORACOLUMBAR VISIT    Patient Name: Larry Blair MRN: [id number] DOB:[birth year], 67 y.o., male Today's Date: 08/20/2022  END OF SESSION:  PT End of Session - 08/19/22 1520     Visit Number 11    Number of Visits 24    Date for PT Re-Evaluation 10/07/22    Progress Note Due on Visit 20    PT Start Time 1518    PT Stop Time 1558    PT Time Calculation (min) 40 min    Equipment Utilized During Treatment Gait belt    Activity Tolerance Patient tolerated treatment well;No increased pain    Behavior During Therapy WFL for tasks assessed/performed                   Past Medical History:  Diagnosis Date   Allergy    Arthritis    right shoulder   Asthma    Eczema    HTN (hypertension)    Hyperlipidemia    Substance abuse (HCC) 1976   daily   Past Surgical History:  Procedure Laterality Date   ANTERIOR CERVICAL DECOMP/DISCECTOMY FUSION N/A 08/30/2019   Procedure: CERVICAL THREE-FOUR ANTERIOR CERVICAL DECOMPRESSION/FUSION;  Surgeon: Tia Alert, MD;  Location: [facility] OR;  Service: Neurosurgery;  Laterality: N/A;   COLONOSCOPY     KNEE ARTHROSCOPY Left    Patient Active Problem List   Diagnosis Date Noted   Encounter for general adult medical examination with abnormal findings 07/04/2022   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 Sanda Linger  REFERRING PROVIDER: Dr. Sanda Linger  REFERRING DIAG: Chronic bilateral Low back pain without Sciatica  Rationale for Evaluation and Treatment:  Rehabilitation  THERAPY DIAG:  Abnormality of gait and mobility  Muscle weakness (generalized)  Difficulty in walking  Chronic bilateral low back pain without sciatica  Joint stiffness  Other lack of coordination  ONSET DATE: 08/27/2022  SUBJECTIVE:                                                                                                                                                                                           SUBJECTIVE STATEMENT:   Patient reports he feels he is progressing with less overall dragging  of feet and less fatigue after working.    PERTINENT HISTORY:  Fall with neck surgery - started it all - damaged vertebrae in my neck- 2021.- s/p ACDF procedure, remote knee scope.   PAIN:  Are you having pain? Yes: NPRS scale: Current= 1/10 and at best; worst = 8/10 Pain location: lower back (right side mostly)  Pain description: ache Aggravating factors: increased time on feet- sore by end of day Relieving factors: Rest, Tylenol, heat occasionally  PRECAUTIONS: Fall  WEIGHT BEARING RESTRICTIONS: No  FALLS:  Has patient fallen in last 6 months? 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   NEXT MD VISIT: No follow up  OBJECTIVE:   DIAGNOSTIC FINDINGS:  CLINICAL DATA:  C-spine fusion   EXAM: CERVICAL SPINE - 2-3 VIEW; DG C-ARM 1-60 MIN   COMPARISON:  MRI 08/26/2019   FINDINGS: Three low resolution intraoperative spot views of the cervical spine. Total fluoroscopy time was 7 seconds.   Initial image demonstrates localizing instrument anterior to C4. Subsequent  images demonstrate placement of surgical plate and fixating screws with interbody device at C3-C4.   IMPRESSION: Intraoperative fluoroscopic assistance provided during cervical spine surgery.     Electronically Signed   By: Jasmine Pang M.D.   On: 08/30/2019 16:48    PATIENT SURVEYS:  Modified Oswestry 18  FOTO 47 with goal of 60  SCREENING FOR RED FLAGS: Bowel or bladder incontinence: No Spinal tumors: No Cauda equina syndrome: No Compression fracture: No Abdominal aneurysm: No  COGNITION: Overall cognitive status: Within functional limits for tasks assessed     SENSATION: Light touch: Impaired   MUSCLE LENGTH: Hamstrings: Right 90+ deg; Left 90+ deg   POSTURE: rounded shoulders and forward head  PALPATION: Tenderness throughout right side lumbar region  LUMBAR ROM:   AROM eval  Flexion Fingers to floor  Extension WNL  Right lateral flexion Fingers to mid calf  Left lateral flexion Fingers to mid calf  Right rotation WNL  Left rotation WNL   (Blank rows = not tested)  LOWER EXTREMITY ROM:     All B LE ROM= WNL   LOWER EXTREMITY MMT:    MMT Right eval Left eval R 5/8 L5/8  Hip flexion 3+ 3+ 3+ 4-  Hip extension 3+ 3+    Hip abduction 3+ 3+    Hip adduction 4 4 4 4   Hip internal rotation 3+ 3+ 3+ 3+  Hip external rotation 3+ 3+ 4- 4-  Knee flexion 4 4 4 4   Knee extension 4 4 4+ 4+  Ankle dorsiflexion 4 4 4+ 4+  Ankle plantarflexion 4 4 4 4   Ankle inversion 4 4 4 4   Ankle eversion 4 4 4 4    (Blank rows = not tested)  LUMBAR SPECIAL TESTS:  Straight leg raise test: Negative, Slump test: Negative, Single leg stance test: To be tested next visit, and FABER test: Negative  FUNCTIONAL TESTS:  5 times sit to stand: 19.23 sec without UE support 6 minute walk test: To be evaluated next visit 10 meter walk test: 15.11 and 15.49 sec without an UE support = 15.3 sec or 0.65  m/s Berg Balance Scale: To be evaluated next visit  GAIT:  Distance  walked: approx 100 feet  Assistive device utilized: Single point cane Level of assistance: SBA Comments: Genu varus- excessive Left LE; right LE decreased heel strike with decreased step height and length, Foot adducted   TODAY'S TREATMENT:                                                                                                                              DATE: 08/19/2022    THEREX:   Single leg RDL with 1 UE Support and opp holding 15# kettlebell 2 sets x 5 reps each LE (no reported pain- Patient rated as medium)   Sit to stand with orange 5kg ball x 12 reps (VC to keep TrA activated) - patient reported as challenging.      NMR:   Dynamic step tap onto "The Step"  (1st level) x 15 reps each LE without UE Support- Mild difficulty raising feet high enough but improved with reps- unsteady also initially.   Activity Description: The Blaze Pod Sequence setting was selected to enhance cognitive function and motor skills, providing a structured environment to follow a specific pattern and improve memory, coordination, and movement sequences.   Activity Setting:  Sequence- placing 3 pods on top of "the step" - instructed to step tap onto lit up color with 1 LE at a time through sequence Number of Pods:  3 Cycles/Sets:  5 Patient did demonstrate progress and adaption improving scores but unable to retrieve actual values today.   Activity Description: Sequencing- Heel to toe activity with 3 pods positioned (heel strike, toe off, and lateral step)  Activity Setting:  Sequencing setting Number of Pods:  3 Cycles/Sets:  5 each LE Duration (Time or Hit Count):  Time  Patient Stats  Reaction Time:  Right LE as swing leg 1) 5.68 sec 2) 4.34 sec 3) 4.80 sec 4) 4.50  5) 4.04 sec    Left LE as swing leg 1) 4.68 2) 4.38 sec 3) 4.34 sec 4) 4.10 sec 5) 3.80 sec    Physical therapy treatment session today consisted of completing assessment of goals and administration of testing as  demonstrated and documented in flow sheet, treatment, and goals section of this note. Addition treatments may be found below.    PATIENT EDUCATION:  Education details: PT plan of care; Review of previous HEP from 2023 Person educated: Patient Education method: Explanation, Demonstration, Tactile cues, and Verbal cues Education comprehension: verbalized understanding, returned demonstration, verbal cues required, tactile cues required, and needs further education  HOME EXERCISE PROGRAM: Reviewed from last episode of care- will add to HEP as appropriate.   ASSESSMENT:  CLINICAL IMPRESSION:  Patient presents with good motivation for continued interventions including balance and LE strengthening. He responded well with blaze pod activities - improved coordination and adaption  with practice. He demo improved heel to toe with no foot drag during activities today.  Pt will continue to benefit from skilled physical therapy intervention to address impairments, improve QOL, and attain therapy goals.    OBJECTIVE IMPAIRMENTS: Abnormal gait, decreased activity tolerance, decreased balance, decreased endurance, decreased mobility, difficulty walking, decreased ROM, decreased strength, and pain.   ACTIVITY LIMITATIONS: carrying, lifting, bending, sitting, standing, squatting, stairs, transfers, and bed mobility  PARTICIPATION LIMITATIONS: cleaning, laundry, shopping, community activity, and yard work  PERSONAL FACTORS: Time since onset of injury/illness/exacerbation and 1-2 comorbidities: HTN, Arthritis, past cervical ACDF procedure in 2021  are also affecting patient's functional outcome.   REHAB POTENTIAL: Good  CLINICAL DECISION MAKING: Stable/uncomplicated  EVALUATION COMPLEXITY: Moderate   GOALS: Goals reviewed with patient? Yes  SHORT TERM GOALS: Target date: 08/26/2022  Pt. Independent with HEP to increase B LE strength 1/2 muscle grade to improve standing/ independence with gait.   Baseline: See chart in objective, several increases but some remain the same  Goal status: IN PROGRESS  2.  Pt will decrease worst back pain as reported on NPRS by at least 2 points in order to demonstrate clinically significant reduction in back pain.   Baseline: EVAL= up to 8/10 at worst 08/14/22: 3/10 at worst  Goal status: MET   LONG TERM GOALS: Target date: 10/07/2022  Pt will improve FOTO to target score  of  60 or greater to display perceived improvements in ability to complete ADL's.  Baseline: EVAL= 47 5/8: 56 Goal status: IN PROGRESS  2.  Pt will decrease MODI score by at least 8 points in order demonstrate clinically significant reduction in back pain/disability.  Baseline: EVAL= 18 5/8: 10 Goal status: IN PROGRESS  3.  Pt will decrease worst back pain as reported on NPRS by at least 3 points in order to demonstrate clinically significant reduction in back pain.   Baseline: EVAL= Up to 8/10 low back pain 08/14/22: 4/10 worst after work  Goal status: IN PROGRESS  4.  Pt will improve FGA by at least 3 points in order to demonstrate clinically significant improvement in balance.   Baseline: EVAL: to be initiated next visit; 07/17/2022= 18/30 08/14/22:19/30 Goal status: IN PROGRESS  5.  Pt will decrease 5TSTS by at least 3 seconds in order to demonstrate clinically significant improvement in LE strength Baseline: EVAL= 19.23 sec  08/14/22:14.38 sec Goal status: MET  6.  Pt will increase by at least 0.13 m/s in order to demonstrate clinically significant improvement in community ambulation.   Baseline: EVAL= 0.65 m/s without UE support  Goal status: MET   7. Patient will improved 6 min walk test > 150 feet for improved gait efficiency and improved overall gait endurance with community activities.    Baseline: 07/17/2022= 1050 feet with cane 5/8: 1230 ft no AD    Goal status:  PLAN:  PT FREQUENCY: 1-2x/week  PT DURATION: 12 weeks  PLANNED INTERVENTIONS: Therapeutic  exercises, Therapeutic activity, Neuromuscular re-education, Balance training, Gait training, Patient/Family education, Self Care, Joint mobilization, Joint manipulation, Stair training, Vestibular training, Canalith repositioning, Orthotic/Fit training, DME instructions, Dry Needling, Spinal manipulation, Spinal mobilization, Cryotherapy, Moist heat, Taping, and Manual therapy.  PLAN FOR NEXT SESSION:   Continue with balance, Therex, and gait training next visit. Manual therapy for LBP as needed   Lenda Kelp PT  Physical Therapist- [facility]   5:07 PM 08/20/22

## 2022-08-21 ENCOUNTER — Encounter: Payer: Self-pay | Admitting: Physical Therapy

## 2022-08-21 ENCOUNTER — Ambulatory Visit: Payer: Medicare Other | Admitting: Physical Therapy

## 2022-08-21 DIAGNOSIS — M545 Low back pain, unspecified: Secondary | ICD-10-CM | POA: Diagnosis not present

## 2022-08-21 DIAGNOSIS — R269 Unspecified abnormalities of gait and mobility: Secondary | ICD-10-CM | POA: Diagnosis not present

## 2022-08-21 DIAGNOSIS — R262 Difficulty in walking, not elsewhere classified: Secondary | ICD-10-CM | POA: Diagnosis not present

## 2022-08-21 DIAGNOSIS — M6281 Muscle weakness (generalized): Secondary | ICD-10-CM | POA: Diagnosis not present

## 2022-08-21 DIAGNOSIS — R2681 Unsteadiness on feet: Secondary | ICD-10-CM | POA: Diagnosis not present

## 2022-08-21 DIAGNOSIS — M2560 Stiffness of unspecified joint, not elsewhere classified: Secondary | ICD-10-CM | POA: Diagnosis not present

## 2022-08-21 DIAGNOSIS — R278 Other lack of coordination: Secondary | ICD-10-CM | POA: Diagnosis not present

## 2022-08-21 DIAGNOSIS — G8929 Other chronic pain: Secondary | ICD-10-CM

## 2022-08-21 NOTE — Therapy (Signed)
OUTPATIENT PHYSICAL THERAPY THORACOLUMBAR VISIT    Patient Name: Larry Blair MRN: [id number] DOB:11-Nov-1955, 67 y.o., male Today's Date: 08/21/2022  END OF SESSION:  PT End of Session - 08/21/22 1424     Visit Number 12    Number of Visits 24    Date for PT Re-Evaluation 10/07/22    Progress Note Due on Visit 20    PT Start Time 1430    PT Stop Time 1513    PT Time Calculation (min) 43 min    Equipment Utilized During Treatment Gait belt    Activity Tolerance Patient tolerated treatment well;No increased pain    Behavior During Therapy WFL for tasks assessed/performed                   Past Medical History:  Diagnosis Date   Allergy    Arthritis    right shoulder   Asthma    Eczema    HTN (hypertension)    Hyperlipidemia    Substance abuse (HCC) 1976   daily   Past Surgical History:  Procedure Laterality Date   ANTERIOR CERVICAL DECOMP/DISCECTOMY FUSION N/A 08/30/2019   Procedure: CERVICAL THREE-FOUR ANTERIOR CERVICAL DECOMPRESSION/FUSION;  Surgeon: Tia Alert, MD;  Location: [facility] OR;  Service: Neurosurgery;  Laterality: N/A;   COLONOSCOPY     KNEE ARTHROSCOPY Left    Patient Active Problem List   Diagnosis Date Noted   Encounter for general adult medical examination with abnormal findings 07/04/2022   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 Sanda Linger  REFERRING PROVIDER: Dr. Sanda Linger  REFERRING DIAG: Chronic bilateral Low back pain without Sciatica  Rationale for Evaluation and Treatment:  Rehabilitation  THERAPY DIAG:  Abnormality of gait and mobility  Difficulty in walking  Chronic bilateral low back pain without sciatica  Muscle weakness (generalized)  ONSET DATE: 08/27/2022  SUBJECTIVE:                                                                                                                                                                                           SUBJECTIVE STATEMENT:   Pt reports increased pain this date secondary to working yesterday being bent over the hood of a car for  a long portion of time.  Patient reports having to take a Tylenol this morning to decrease his pain.   PERTINENT HISTORY:  Fall with neck surgery - started it all - damaged vertebrae in my neck- 2021.- s/p ACDF procedure, remote knee scope.   PAIN:  Are you having pain? Yes: NPRS scale: Current= 3/10 and at best; worst = 8/10 Pain location: lower back (right side mostly)  Pain description: ache Aggravating factors: increased time on feet- sore by end of day Relieving factors: Rest, Tylenol, heat occasionally  PRECAUTIONS: Fall  WEIGHT BEARING RESTRICTIONS: No  FALLS:  Has patient fallen in last 6 months? 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   NEXT MD VISIT: No follow up  OBJECTIVE:   DIAGNOSTIC FINDINGS:  CLINICAL DATA:  C-spine fusion   EXAM: CERVICAL SPINE - 2-3 VIEW; DG C-ARM 1-60 MIN   COMPARISON:  MRI 08/26/2019   FINDINGS: Three low resolution intraoperative spot views of the cervical spine. Total fluoroscopy time was 7 seconds.   Initial image demonstrates  localizing instrument anterior to C4. Subsequent images demonstrate placement of surgical plate and fixating screws with interbody device at C3-C4.   IMPRESSION: Intraoperative fluoroscopic assistance provided during cervical spine surgery.     Electronically Signed   By: Jasmine Pang M.D.   On: 08/30/2019 16:48    PATIENT SURVEYS:  Modified Oswestry 18  FOTO 47 with goal of 60  SCREENING FOR RED FLAGS: Bowel or bladder incontinence: No Spinal tumors: No Cauda equina syndrome: No Compression fracture: No Abdominal aneurysm: No  COGNITION: Overall cognitive status: Within functional limits for tasks assessed     SENSATION: Light touch: Impaired   MUSCLE LENGTH: Hamstrings: Right 90+ deg; Left 90+ deg   POSTURE: rounded shoulders and forward head  PALPATION: Tenderness throughout right side lumbar region  LUMBAR ROM:   AROM eval  Flexion Fingers to floor  Extension WNL  Right lateral flexion Fingers to mid calf  Left lateral flexion Fingers to mid calf  Right rotation WNL  Left rotation WNL   (Blank rows = not tested)  LOWER EXTREMITY ROM:     All B LE ROM= WNL   LOWER EXTREMITY MMT:    MMT Right eval Left eval R 5/8 L5/8  Hip flexion 3+ 3+ 3+ 4-  Hip extension 3+ 3+    Hip abduction 3+ 3+    Hip adduction 4 4 4 4   Hip internal rotation 3+ 3+ 3+ 3+  Hip external rotation 3+ 3+ 4- 4-  Knee flexion 4 4 4 4   Knee extension 4 4 4+ 4+  Ankle dorsiflexion 4 4 4+ 4+  Ankle plantarflexion 4 4 4 4   Ankle inversion 4 4 4 4   Ankle eversion 4 4 4 4    (Blank rows = not tested)  LUMBAR SPECIAL TESTS:  Straight leg raise test: Negative, Slump test: Negative, Single leg stance test: To be tested next visit, and FABER test: Negative  FUNCTIONAL TESTS:  5 times sit to stand: 19.23 sec without UE support 6 minute walk test: To be evaluated next visit 10 meter walk test: 15.11 and  15.49 sec without an UE support = 15.3 sec or 0.65 m/s Berg Balance Scale:  To be evaluated next visit  GAIT:  Distance walked: approx 100 feet  Assistive device utilized: Single point cane Level of assistance: SBA Comments: Genu varus- excessive Left LE; right LE decreased heel strike with decreased step height and length, Foot adducted   TODAY'S TREATMENT:                                                                                                                                  THEREX:   LE hip PROM To BLE and Lumbar region for pain modulation of proximal portion of lower extremities and lumbar spine secondary to increase in pain this date: Hold 30 sec x 2 each LE - Single Knee to chest - Lower trunk rotation -hip ER stretch  -Piriformis -Hamstring  Sit to stand with orange 5kg ball x 13 reps  - patient reported as challenging.   March walk 2 x 10 ea LE with no UE support   Retro walk x 30 ft no UE support   NMR:   Dynamic step tap onto 6 inch step x 10 ea LE without UE support reps each LE  Semitandem balance x 30 sec ea LE  - EC x 30 sec ea LE    PATIENT EDUCATION:  Education details: PT plan of care; Review of previous HEP from 2023 Person educated: Patient Education method: Programmer, multimedia, Demonstration, Tactile cues, and Verbal cues Education comprehension: verbalized understanding, returned demonstration, verbal cues required, tactile cues required, and needs further education  HOME EXERCISE PROGRAM: Reviewed from last episode of care- will add to HEP as appropriate.   ASSESSMENT:  CLINICAL IMPRESSION:  Patient presents with good motivation for completion of physical therapy activities.  Patient did need to focus on lower extremity stretching secondary to pain and discomfort he was experiencing following having to work on a car yesterday for his job. Pt reports significant decrease in pain following treatment. Pt continues with activities to improve foot clearance and balance with good tolerance. Pt will continue to benefit from  skilled physical therapy intervention to address impairments, improve QOL, and attain therapy goals.     OBJECTIVE IMPAIRMENTS: Abnormal gait, decreased activity tolerance, decreased balance, decreased endurance, decreased mobility, difficulty walking, decreased ROM, decreased strength, and pain.   ACTIVITY LIMITATIONS: carrying, lifting, bending, sitting, standing, squatting, stairs, transfers, and bed mobility  PARTICIPATION LIMITATIONS: cleaning, laundry, shopping, community activity, and yard work  PERSONAL FACTORS: Time since onset of injury/illness/exacerbation and 1-2 comorbidities: HTN, Arthritis, past cervical ACDF procedure in 2021  are also affecting patient's functional outcome.   REHAB POTENTIAL: Good  CLINICAL DECISION MAKING: Stable/uncomplicated  EVALUATION COMPLEXITY: Moderate   GOALS: Goals reviewed with patient? Yes  SHORT TERM GOALS: Target date: 08/26/2022  Pt. Independent with HEP to increase B LE strength 1/2 muscle grade to improve standing/ independence with gait.  Baseline: See  chart in objective, several increases but some remain the same  Goal status: IN PROGRESS  2.  Pt will decrease worst back pain as reported on NPRS by at least 2 points in order to demonstrate clinically significant reduction in back pain.   Baseline: EVAL= up to 8/10 at worst 08/14/22: 3/10 at worst  Goal status: MET   LONG TERM GOALS: Target date: 10/07/2022  Pt will improve FOTO to target score  of  60 or greater to display perceived improvements in ability to complete ADL's.  Baseline: EVAL= 47 5/8: 56 Goal status: IN PROGRESS  2.  Pt will decrease MODI score by at least 8 points in order demonstrate clinically significant reduction in back pain/disability.  Baseline: EVAL= 18 5/8: 10 Goal status: IN PROGRESS  3.  Pt will decrease worst back pain as reported on NPRS by at least 3 points in order to demonstrate clinically significant reduction in back pain.   Baseline: EVAL=  Up to 8/10 low back pain 08/14/22: 4/10 worst after work  Goal status: IN PROGRESS  4.  Pt will improve FGA by at least 3 points in order to demonstrate clinically significant improvement in balance.   Baseline: EVAL: to be initiated next visit; 07/17/2022= 18/30 08/14/22:19/30 Goal status: IN PROGRESS  5.  Pt will decrease 5TSTS by at least 3 seconds in order to demonstrate clinically significant improvement in LE strength Baseline: EVAL= 19.23 sec  08/14/22:14.38 sec Goal status: MET  6.  Pt will increase by at least 0.13 m/s in order to demonstrate clinically significant improvement in community ambulation.   Baseline: EVAL= 0.65 m/s without UE support  Goal status: MET   7. Patient will improved 6 min walk test > 150 feet for improved gait efficiency and improved overall gait endurance with community activities.    Baseline: 07/17/2022= 1050 feet with cane 5/8: 1230 ft no AD    Goal status:  PLAN:  PT FREQUENCY: 1-2x/week  PT DURATION: 12 weeks  PLANNED INTERVENTIONS: Therapeutic exercises, Therapeutic activity, Neuromuscular re-education, Balance training, Gait training, Patient/Family education, Self Care, Joint mobilization, Joint manipulation, Stair training, Vestibular training, Canalith repositioning, Orthotic/Fit training, DME instructions, Dry Needling, Spinal manipulation, Spinal mobilization, Cryotherapy, Moist heat, Taping, and Manual therapy.  PLAN FOR NEXT SESSION:   Continue with balance, Therex, and gait training next visit. Manual therapy for LBP as needed   Norman Herrlich PT  Physical Therapist- [facility]  [facility]   3:26 PM 08/21/22

## 2022-08-26 ENCOUNTER — Ambulatory Visit: Payer: Medicare Other

## 2022-08-26 DIAGNOSIS — M6281 Muscle weakness (generalized): Secondary | ICD-10-CM | POA: Diagnosis not present

## 2022-08-26 DIAGNOSIS — G8929 Other chronic pain: Secondary | ICD-10-CM | POA: Diagnosis not present

## 2022-08-26 DIAGNOSIS — R269 Unspecified abnormalities of gait and mobility: Secondary | ICD-10-CM | POA: Diagnosis not present

## 2022-08-26 DIAGNOSIS — R262 Difficulty in walking, not elsewhere classified: Secondary | ICD-10-CM | POA: Diagnosis not present

## 2022-08-26 DIAGNOSIS — R278 Other lack of coordination: Secondary | ICD-10-CM

## 2022-08-26 DIAGNOSIS — M545 Low back pain, unspecified: Secondary | ICD-10-CM | POA: Diagnosis not present

## 2022-08-26 DIAGNOSIS — R2681 Unsteadiness on feet: Secondary | ICD-10-CM | POA: Diagnosis not present

## 2022-08-26 DIAGNOSIS — M2560 Stiffness of unspecified joint, not elsewhere classified: Secondary | ICD-10-CM | POA: Diagnosis not present

## 2022-08-26 NOTE — Therapy (Signed)
OUTPATIENT PHYSICAL THERAPY THORACOLUMBAR VISIT    Patient Name: Larry Blair MRN: [id number] DOB:07/11/1955, 67 y.o., male Today's Date: 08/26/2022  END OF SESSION:  PT End of Session - 08/26/22 1100     Visit Number 13    Number of Visits 24    Date for PT Re-Evaluation 10/07/22    Progress Note Due on Visit 20    PT Start Time 1101    PT Stop Time 1144    PT Time Calculation (min) 43 min    Equipment Utilized During Treatment Gait belt    Activity Tolerance Patient tolerated treatment well;No increased pain    Behavior During Therapy WFL for tasks assessed/performed                    Past Medical History:  Diagnosis Date   Allergy    Arthritis    right shoulder   Asthma    Eczema    HTN (hypertension)    Hyperlipidemia    Substance abuse (HCC) 1976   daily   Past Surgical History:  Procedure Laterality Date   ANTERIOR CERVICAL DECOMP/DISCECTOMY FUSION N/A 08/30/2019   Procedure: CERVICAL THREE-FOUR ANTERIOR CERVICAL DECOMPRESSION/FUSION;  Surgeon: Tia Alert, MD;  Location: [facility] OR;  Service: Neurosurgery;  Laterality: N/A;   COLONOSCOPY     KNEE ARTHROSCOPY Left    Patient Active Problem List   Diagnosis Date Noted   Encounter for general adult medical examination with abnormal findings 07/04/2022   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 Sanda Linger  REFERRING PROVIDER: Dr. Sanda Linger  REFERRING DIAG: Chronic bilateral Low back pain without Sciatica  Rationale for Evaluation and Treatment:  Rehabilitation  THERAPY DIAG:  Muscle weakness (generalized)  Unsteadiness on feet  Difficulty in walking, not elsewhere classified  Other lack of coordination  ONSET DATE: 08/27/2022  SUBJECTIVE:                                                                                                                                                                                           SUBJECTIVE STATEMENT:   Pt reports pain only about 1-1.5/10 today. He took Tylenol prior to session. Pt reports no stumbles/falls. Pt reports no  other updates.   PERTINENT HISTORY:  Fall with neck surgery - started it all - damaged vertebrae in my neck- 2021.- s/p ACDF procedure, remote knee scope.   PAIN:  Are you having pain? Yes: NPRS scale: Current= 3/10 and at best; worst = 8/10 Pain location: lower back (right side mostly)  Pain description: ache Aggravating factors: increased time on feet- sore by end of day Relieving factors: Rest, Tylenol, heat occasionally  PRECAUTIONS: Fall  WEIGHT BEARING RESTRICTIONS: No  FALLS:  Has patient fallen in last 6 months? 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   NEXT MD VISIT: No follow up  OBJECTIVE:   DIAGNOSTIC FINDINGS:  CLINICAL DATA:  C-spine fusion   EXAM: CERVICAL SPINE - 2-3 VIEW; DG C-ARM 1-60 MIN   COMPARISON:  MRI 08/26/2019   FINDINGS: Three low resolution intraoperative spot views of the cervical spine. Total fluoroscopy time was 7 seconds.   Initial image demonstrates localizing instrument anterior to C4. Subsequent images demonstrate placement of  surgical plate and fixating screws with interbody device at C3-C4.   IMPRESSION: Intraoperative fluoroscopic assistance provided during cervical spine surgery.     Electronically Signed   By: Jasmine Pang M.D.   On: 08/30/2019 16:48    PATIENT SURVEYS:  Modified Oswestry 18  FOTO 47 with goal of 60  SCREENING FOR RED FLAGS: Bowel or bladder incontinence: No Spinal tumors: No Cauda equina syndrome: No Compression fracture: No Abdominal aneurysm: No  COGNITION: Overall cognitive status: Within functional limits for tasks assessed     SENSATION: Light touch: Impaired   MUSCLE LENGTH: Hamstrings: Right 90+ deg; Left 90+ deg   POSTURE: rounded shoulders and forward head  PALPATION: Tenderness throughout right side lumbar region  LUMBAR ROM:   AROM eval  Flexion Fingers to floor  Extension WNL  Right lateral flexion Fingers to mid calf  Left lateral flexion Fingers to mid calf  Right rotation WNL  Left rotation WNL   (Blank rows = not tested)  LOWER EXTREMITY ROM:     All B LE ROM= WNL   LOWER EXTREMITY MMT:    MMT Right eval Left eval R 5/8 L5/8  Hip flexion 3+ 3+ 3+ 4-  Hip extension 3+ 3+    Hip abduction 3+ 3+    Hip adduction 4 4 4 4   Hip internal rotation 3+ 3+ 3+ 3+  Hip external rotation 3+ 3+ 4- 4-  Knee flexion 4 4 4 4   Knee extension 4 4 4+ 4+  Ankle dorsiflexion 4 4 4+ 4+  Ankle plantarflexion 4 4 4 4   Ankle inversion 4 4 4 4   Ankle eversion 4 4 4 4    (Blank rows = not tested)  LUMBAR SPECIAL TESTS:  Straight leg raise test: Negative, Slump test: Negative, Single leg stance test: To be tested next visit, and FABER test: Negative  FUNCTIONAL TESTS:  5 times sit to stand: 19.23 sec without UE support 6 minute walk test: To be evaluated next visit 10 meter walk test: 15.11 and 15.49 sec without an UE support = 15.3 sec or 0.65 m/s Berg Balance Scale: To be  evaluated next visit  GAIT:  Distance walked: approx 100 feet  Assistive  device utilized: Single point cane Level of assistance: SBA Comments: Genu varus- excessive Left LE; right LE decreased heel strike with decreased step height and length, Foot adducted   TODAY'S TREATMENT:                                                                                                                               TE:   Resisted walking @ [employer] machine - FWD/BCKWD/ and LTL each direction for strengthening, gait/balance X 4 ea direction with cane use and 12.5# resistance  -- pt then repeated with 17.5# each direction 2x    STS 21x with 2000 gr ball   Nustep interval training for LE mm and cardioresp endurance. PT adjusts intensity throughout and monitors pt for response to intervention Lvl 1 x 1 min Lvl 3 x 30 sec  Lvl 4 x 30 sec  Lvl 5 x 30 sec  Lvl 6 x 30 sec  Lvl 4 x 1 min  Lvl 3 x 1 min Lvl 2 x 1 min  SPM maintained around 80s-90s throughout  Staggered STS 10x each LE. Pt rates challenging  NMR: Gait mechanics agility ladder 6x through, cuing for step-length, heel-strike, pt able to self-correct   LLE stability Obstacle course: taps on 6 balance pods with navigating agility ladder 4x  Alt LE tap on 6" step x multiple reps each LE Alt LE cone tap fwd and LT tapping x multiple reps each LE - more difficult    PATIENT EDUCATION:  Education details: Pt educated throughout session about proper posture and technique with exercises. Improved exercise technique, movement at target joints, use of target muscles after min to mod verbal, visual, tactile cues.  Person educated: Patient Education method: Explanation, Demonstration, Tactile cues, and Verbal cues Education comprehension: verbalized understanding, returned demonstration, verbal cues required, tactile cues required, and needs further education  HOME EXERCISE PROGRAM: Reviewed from last episode of care- will add to HEP as appropriate.   ASSESSMENT:  CLINICAL IMPRESSION:  Pt with excellent  motivation to participate in session. He tolerates all interventions well without increased pain. Pt also able to advance LE resistance training intervention at [facility] machine and shows good ability to self-correct gait mechanics with agility ladder intervention. Pt most challenged today with SLB tasks requiring increased eccentric control (cone taps). Pt will continue to benefit from skilled physical therapy intervention to address impairments, improve QOL, and attain therapy goals.     OBJECTIVE IMPAIRMENTS: Abnormal gait, decreased activity tolerance, decreased balance, decreased endurance, decreased mobility, difficulty walking, decreased ROM, decreased strength, and pain.   ACTIVITY LIMITATIONS: carrying, lifting, bending, sitting, standing, squatting, stairs, transfers, and bed mobility  PARTICIPATION LIMITATIONS: cleaning, laundry, shopping, community activity, and yard work  PERSONAL FACTORS: Time since onset of injury/illness/exacerbation and 1-2 comorbidities: HTN, Arthritis, past cervical ACDF procedure in 2021  are also affecting patient's  functional outcome.   REHAB POTENTIAL: Good  CLINICAL DECISION MAKING: Stable/uncomplicated  EVALUATION COMPLEXITY: Moderate   GOALS: Goals reviewed with patient? Yes  SHORT TERM GOALS: Target date: 08/26/2022  Pt. Independent with HEP to increase B LE strength 1/2 muscle grade to improve standing/ independence with gait.  Baseline: See chart in objective, several increases but some remain the same  Goal status: IN PROGRESS  2.  Pt will decrease worst back pain as reported on NPRS by at least 2 points in order to demonstrate clinically significant reduction in back pain.   Baseline: EVAL= up to 8/10 at worst 08/14/22: 3/10 at worst  Goal status: MET   LONG TERM GOALS: Target date: 10/07/2022  Pt will improve FOTO to target score  of  60 or greater to display perceived improvements in ability to complete ADL's.  Baseline: EVAL= 47  5/8: 56 Goal status: IN PROGRESS  2.  Pt will decrease MODI score by at least 8 points in order demonstrate clinically significant reduction in back pain/disability.  Baseline: EVAL= 18 5/8: 10 Goal status: IN PROGRESS  3.  Pt will decrease worst back pain as reported on NPRS by at least 3 points in order to demonstrate clinically significant reduction in back pain.   Baseline: EVAL= Up to 8/10 low back pain 08/14/22: 4/10 worst after work  Goal status: IN PROGRESS  4.  Pt will improve FGA by at least 3 points in order to demonstrate clinically significant improvement in balance.   Baseline: EVAL: to be initiated next visit; 07/17/2022= 18/30 08/14/22:19/30 Goal status: IN PROGRESS  5.  Pt will decrease 5TSTS by at least 3 seconds in order to demonstrate clinically significant improvement in LE strength Baseline: EVAL= 19.23 sec  08/14/22:14.38 sec Goal status: MET  6.  Pt will increase by at least 0.13 m/s in order to demonstrate clinically significant improvement in community ambulation.   Baseline: EVAL= 0.65 m/s without UE support  Goal status: MET   7. Patient will improved 6 min walk test > 150 feet for improved gait efficiency and improved overall gait endurance with community activities.    Baseline: 07/17/2022= 1050 feet with cane 5/8: 1230 ft no AD    Goal status:  PLAN:  PT FREQUENCY: 1-2x/week  PT DURATION: 12 weeks  PLANNED INTERVENTIONS: Therapeutic exercises, Therapeutic activity, Neuromuscular re-education, Balance training, Gait training, Patient/Family education, Self Care, Joint mobilization, Joint manipulation, Stair training, Vestibular training, Canalith repositioning, Orthotic/Fit training, DME instructions, Dry Needling, Spinal manipulation, Spinal mobilization, Cryotherapy, Moist heat, Taping, and Manual therapy.  PLAN FOR NEXT SESSION:   Continue with balance, Therex, and gait training next visit. Manual therapy for LBP as needed   Baird Kay PT   Physical Therapist- [facility]  [facility]   1:09 PM 08/26/22

## 2022-08-27 ENCOUNTER — Encounter: Payer: Self-pay | Admitting: Gastroenterology

## 2022-08-28 ENCOUNTER — Ambulatory Visit: Payer: Medicare Other

## 2022-08-28 DIAGNOSIS — R2681 Unsteadiness on feet: Secondary | ICD-10-CM

## 2022-08-28 DIAGNOSIS — R262 Difficulty in walking, not elsewhere classified: Secondary | ICD-10-CM

## 2022-08-28 DIAGNOSIS — M6281 Muscle weakness (generalized): Secondary | ICD-10-CM

## 2022-08-28 DIAGNOSIS — G8929 Other chronic pain: Secondary | ICD-10-CM

## 2022-08-28 DIAGNOSIS — M2560 Stiffness of unspecified joint, not elsewhere classified: Secondary | ICD-10-CM | POA: Diagnosis not present

## 2022-08-28 DIAGNOSIS — M545 Low back pain, unspecified: Secondary | ICD-10-CM

## 2022-08-28 DIAGNOSIS — R278 Other lack of coordination: Secondary | ICD-10-CM

## 2022-08-28 DIAGNOSIS — R269 Unspecified abnormalities of gait and mobility: Secondary | ICD-10-CM

## 2022-08-28 NOTE — Therapy (Signed)
OUTPATIENT PHYSICAL THERAPY THORACOLUMBAR VISIT    Patient Name: Larry Blair MRN: [id number] DOB:01-14-1956, 67 y.o., male Today's Date: 08/28/2022  END OF SESSION:  PT End of Session - 08/28/22 1454     Visit Number 14    Number of Visits 24    Date for PT Re-Evaluation 10/07/22    Progress Note Due on Visit 20    PT Start Time 1445    PT Stop Time 1515    PT Time Calculation (min) 30 min    Equipment Utilized During Treatment Gait belt    Activity Tolerance Patient tolerated treatment well;No increased pain    Behavior During Therapy WFL for tasks assessed/performed                    Past Medical History:  Diagnosis Date   Allergy    Arthritis    right shoulder   Asthma    Eczema    HTN (hypertension)    Hyperlipidemia    Substance abuse (HCC) 1976   daily   Past Surgical History:  Procedure Laterality Date   ANTERIOR CERVICAL DECOMP/DISCECTOMY FUSION N/A 08/30/2019   Procedure: CERVICAL THREE-FOUR ANTERIOR CERVICAL DECOMPRESSION/FUSION;  Surgeon: Tia Alert, MD;  Location: [facility] OR;  Service: Neurosurgery;  Laterality: N/A;   COLONOSCOPY     KNEE ARTHROSCOPY Left    Patient Active Problem List   Diagnosis Date Noted   Encounter for general adult medical examination with abnormal findings 07/04/2022   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 Sanda Linger  REFERRING PROVIDER: Dr. Sanda Linger  REFERRING DIAG: Chronic bilateral Low back pain without Sciatica  Rationale for Evaluation and Treatment:  Rehabilitation  THERAPY DIAG:  Muscle weakness (generalized)  Unsteadiness on feet  Difficulty in walking, not elsewhere classified  Other lack of coordination  Abnormality of gait and mobility  Difficulty in walking  Chronic bilateral low back pain without sciatica  Joint stiffness  ONSET DATE: 08/27/2022  SUBJECTIVE:                                                                                                                                                                                           SUBJECTIVE STATEMENT:  Pt reports pain only about 1-1.5/10 today. He took Tylenol prior to session. Pt reports no stumbles/falls. Pt reports no other updates.   PERTINENT HISTORY:  Fall with neck surgery - started it all - damaged vertebrae in my neck- 2021.- s/p ACDF procedure, remote knee scope.   PAIN:  Are you having pain? Yes: NPRS scale: Current= 3/10 and at best; worst = 8/10 Pain location: lower back (right side mostly)  Pain description: ache Aggravating factors: increased time on feet- sore by end of day Relieving factors: Rest, Tylenol, heat occasionally  PRECAUTIONS: Fall  WEIGHT BEARING RESTRICTIONS: No  FALLS:  Has patient fallen in last 6 months? 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   NEXT MD VISIT: No follow up  OBJECTIVE:   DIAGNOSTIC FINDINGS:  CLINICAL DATA:  C-spine fusion   EXAM: CERVICAL SPINE - 2-3 VIEW; DG C-ARM 1-60 MIN   COMPARISON:  MRI 08/26/2019   FINDINGS: Three low resolution intraoperative spot views of the cervical spine. Total fluoroscopy  time was 7 seconds.   Initial image demonstrates localizing instrument anterior to C4. Subsequent images demonstrate placement of surgical plate and fixating screws with interbody device at C3-C4.   IMPRESSION: Intraoperative fluoroscopic assistance provided during cervical spine surgery.     Electronically Signed   By: Jasmine Pang M.D.   On: 08/30/2019 16:48    PATIENT SURVEYS:  Modified Oswestry 18  FOTO 47 with goal of 60  SCREENING FOR RED FLAGS: Bowel or bladder incontinence: No Spinal tumors: No Cauda equina syndrome: No Compression fracture: No Abdominal aneurysm: No  COGNITION: Overall cognitive status: Within functional limits for tasks assessed     SENSATION: Light touch: Impaired   MUSCLE LENGTH: Hamstrings: Right 90+ deg; Left 90+ deg   POSTURE: rounded shoulders and forward head  PALPATION: Tenderness throughout right side lumbar region  LUMBAR ROM:   AROM eval  Flexion Fingers to floor  Extension WNL  Right lateral flexion Fingers to mid calf  Left lateral flexion Fingers to mid calf  Right rotation WNL  Left rotation WNL   (Blank rows = not tested)  LOWER EXTREMITY ROM:     All B LE ROM= WNL   LOWER EXTREMITY MMT:    MMT Right eval Left eval R 5/8 L5/8  Hip flexion 3+ 3+ 3+ 4-  Hip extension 3+ 3+    Hip abduction 3+ 3+    Hip adduction 4 4 4 4   Hip internal rotation 3+ 3+ 3+ 3+  Hip external rotation 3+ 3+ 4- 4-  Knee flexion 4 4 4 4   Knee extension 4 4 4+ 4+  Ankle dorsiflexion 4 4 4+ 4+  Ankle plantarflexion 4 4 4 4   Ankle inversion 4 4 4 4   Ankle eversion 4 4 4 4    (Blank rows = not tested)  LUMBAR SPECIAL TESTS:  Straight leg raise test: Negative, Slump test: Negative, Single leg stance test: To be tested next visit, and FABER test: Negative  FUNCTIONAL TESTS:  5 times sit to stand: 19.23 sec without UE support 6 minute walk test: To be evaluated next visit 10 meter walk  test: 15.11 and 15.49 sec without an UE  support = 15.3 sec or 0.65 m/s Berg Balance Scale: To be evaluated next visit  GAIT:  Distance walked: approx 100 feet  Assistive device utilized: Single point cane Level of assistance: SBA Comments: Genu varus- excessive Left LE; right LE decreased heel strike with decreased step height and length, Foot adducted   TODAY'S TREATMENT:                                                                                                                               TE:    Dynamic marching on airex pad x 20 reps (VC for slow controlled motion)  Dynamic forward/retro step over 2 (1/2 foam rolls positioned right beside each other) x 15 reps  Seated calf raises x15 reps Seated hip march with TrA contraction x 15 reps  Sit to stand:  7 reps- from sit to 1/3 standing 7 reps- full sit to stand 7 reps - starting at standing- minisquat 1/3 way down.  Resistive band- side step (red TB) in // bars - down and back x 3.   Resistive band walk in // bars- Feet wide for band tension and in partial squat position- walk forward toward one end of // bar then back x 2    PATIENT EDUCATION:  Education details: Pt educated throughout session about proper posture and technique with exercises. Improved exercise technique, movement at target joints, use of target muscles after min to mod verbal, visual, tactile cues.  Person educated: Patient Education method: Explanation, Demonstration, Tactile cues, and Verbal cues Education comprehension: verbalized understanding, returned demonstration, verbal cues required, tactile cues required, and needs further education  HOME EXERCISE PROGRAM: Reviewed from last episode of care- will add to HEP as appropriate.   ASSESSMENT:  CLINICAL IMPRESSION:  Patient accepted an abbreviated workout session to accommodate schedule so he and his wife would be done sooner. He arrived with excellent motivation- Very responsive to all VC to challenge his balance. He reported  fatigue with LE's after session but exhibited no specific difficulty as in no pain or LOB during session.  Pt will continue to benefit from skilled physical therapy intervention to address impairments, improve QOL, and attain therapy goals.     OBJECTIVE IMPAIRMENTS: Abnormal gait, decreased activity tolerance, decreased balance, decreased endurance, decreased mobility, difficulty walking, decreased ROM, decreased strength, and pain.   ACTIVITY LIMITATIONS: carrying, lifting, bending, sitting, standing, squatting, stairs, transfers, and bed mobility  PARTICIPATION LIMITATIONS: cleaning, laundry, shopping, community activity, and yard work  PERSONAL FACTORS: Time since onset of injury/illness/exacerbation and 1-2 comorbidities: HTN, Arthritis, past cervical ACDF procedure in 2021  are also affecting patient's functional outcome.   REHAB POTENTIAL: Good  CLINICAL DECISION MAKING: Stable/uncomplicated  EVALUATION COMPLEXITY: Moderate   GOALS: Goals reviewed with patient? Yes  SHORT TERM GOALS: Target date: 08/26/2022  Pt. Independent with HEP to increase B LE strength 1/2 muscle grade to improve standing/  independence with gait.  Baseline: See chart in objective, several increases but some remain the same  Goal status: IN PROGRESS  2.  Pt will decrease worst back pain as reported on NPRS by at least 2 points in order to demonstrate clinically significant reduction in back pain.   Baseline: EVAL= up to 8/10 at worst 08/14/22: 3/10 at worst  Goal status: MET   LONG TERM GOALS: Target date: 10/07/2022  Pt will improve FOTO to target score  of  60 or greater to display perceived improvements in ability to complete ADL's.  Baseline: EVAL= 47 5/8: 56 Goal status: IN PROGRESS  2.  Pt will decrease MODI score by at least 8 points in order demonstrate clinically significant reduction in back pain/disability.  Baseline: EVAL= 18 5/8: 10 Goal status: IN PROGRESS  3.  Pt will decrease worst  back pain as reported on NPRS by at least 3 points in order to demonstrate clinically significant reduction in back pain.   Baseline: EVAL= Up to 8/10 low back pain 08/14/22: 4/10 worst after work  Goal status: IN PROGRESS  4.  Pt will improve FGA by at least 3 points in order to demonstrate clinically significant improvement in balance.   Baseline: EVAL: to be initiated next visit; 07/17/2022= 18/30 08/14/22:19/30 Goal status: IN PROGRESS  5.  Pt will decrease 5TSTS by at least 3 seconds in order to demonstrate clinically significant improvement in LE strength Baseline: EVAL= 19.23 sec  08/14/22:14.38 sec Goal status: MET  6.  Pt will increase by at least 0.13 m/s in order to demonstrate clinically significant improvement in community ambulation.   Baseline: EVAL= 0.65 m/s without UE support  Goal status: MET   7. Patient will improved 6 min walk test > 150 feet for improved gait efficiency and improved overall gait endurance with community activities.    Baseline: 07/17/2022= 1050 feet with cane 5/8: 1230 ft no AD    Goal status:  PLAN:  PT FREQUENCY: 1-2x/week  PT DURATION: 12 weeks  PLANNED INTERVENTIONS: Therapeutic exercises, Therapeutic activity, Neuromuscular re-education, Balance training, Gait training, Patient/Family education, Self Care, Joint mobilization, Joint manipulation, Stair training, Vestibular training, Canalith repositioning, Orthotic/Fit training, DME instructions, Dry Needling, Spinal manipulation, Spinal mobilization, Cryotherapy, Moist heat, Taping, and Manual therapy.  PLAN FOR NEXT SESSION:   Continue with balance, Therex, and gait training next visit. Manual therapy for LBP as needed   Lenda Kelp PT  Physical Therapist- [facility]  [facility]   3:25 PM 08/28/22

## 2022-09-04 ENCOUNTER — Ambulatory Visit: Payer: Medicare Other

## 2022-09-04 DIAGNOSIS — R269 Unspecified abnormalities of gait and mobility: Secondary | ICD-10-CM | POA: Diagnosis not present

## 2022-09-04 DIAGNOSIS — R262 Difficulty in walking, not elsewhere classified: Secondary | ICD-10-CM | POA: Diagnosis not present

## 2022-09-04 DIAGNOSIS — G8929 Other chronic pain: Secondary | ICD-10-CM

## 2022-09-04 DIAGNOSIS — M6281 Muscle weakness (generalized): Secondary | ICD-10-CM | POA: Diagnosis not present

## 2022-09-04 DIAGNOSIS — M2560 Stiffness of unspecified joint, not elsewhere classified: Secondary | ICD-10-CM | POA: Diagnosis not present

## 2022-09-04 DIAGNOSIS — M545 Low back pain, unspecified: Secondary | ICD-10-CM | POA: Diagnosis not present

## 2022-09-04 DIAGNOSIS — R278 Other lack of coordination: Secondary | ICD-10-CM

## 2022-09-04 DIAGNOSIS — R2681 Unsteadiness on feet: Secondary | ICD-10-CM
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
## 2022-09-06 ENCOUNTER — Ambulatory Visit: Payer: Medicare Other | Admitting: Physical Therapy

## 2022-09-06 DIAGNOSIS — R2681 Unsteadiness on feet: Secondary | ICD-10-CM

## 2022-09-06 DIAGNOSIS — R262 Difficulty in walking, not elsewhere classified: Secondary | ICD-10-CM

## 2022-09-06 DIAGNOSIS — R269 Unspecified abnormalities of gait and mobility: Secondary | ICD-10-CM | POA: Diagnosis not present

## 2022-09-06 DIAGNOSIS — M2560 Stiffness of unspecified joint, not elsewhere classified: Secondary | ICD-10-CM

## 2022-09-06 DIAGNOSIS — G8929 Other chronic pain: Secondary | ICD-10-CM

## 2022-09-06 DIAGNOSIS — M6281 Muscle weakness (generalized): Secondary | ICD-10-CM | POA: Diagnosis not present

## 2022-09-06 DIAGNOSIS — R278 Other lack of coordination: Secondary | ICD-10-CM | POA: Diagnosis not present

## 2022-09-06 DIAGNOSIS — M545 Low back pain, unspecified: Secondary | ICD-10-CM | POA: Diagnosis not present

## 2022-09-06 NOTE — Therapy (Signed)
OUTPATIENT PHYSICAL THERAPY THORACOLUMBAR VISIT    Patient Name: Larry Blair MRN: [id number] DOB:12-18-1955, 67 y.o., male Today's Date: 09/06/2022  END OF SESSION:  PT End of Session - 09/06/22 1101     Visit Number 16    Number of Visits 24    Date for PT Re-Evaluation 10/07/22    Progress Note Due on Visit 20    PT Start Time 1020    PT Stop Time 1103    PT Time Calculation (min) 43 min    Equipment Utilized During Treatment Gait belt    Activity Tolerance Patient tolerated treatment well;No increased pain    Behavior During Therapy WFL for tasks assessed/performed                    Past Medical History:  Diagnosis Date   Allergy    Arthritis    right shoulder   Asthma    Eczema    HTN (hypertension)    Hyperlipidemia    Substance abuse (HCC) 1976   daily   Past Surgical History:  Procedure Laterality Date   ANTERIOR CERVICAL DECOMP/DISCECTOMY FUSION N/A 08/30/2019   Procedure: CERVICAL THREE-FOUR ANTERIOR CERVICAL DECOMPRESSION/FUSION;  Surgeon: Tia Alert, MD;  Location: [facility] OR;  Service: Neurosurgery;  Laterality: N/A;   COLONOSCOPY     KNEE ARTHROSCOPY Left    Patient Active Problem List   Diagnosis Date Noted   Encounter for general adult medical examination with abnormal findings 07/04/2022   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 Sanda Linger  REFERRING PROVIDER: Dr. Sanda Linger  REFERRING DIAG: Chronic bilateral Low back pain without Sciatica  Rationale for Evaluation and Treatment:  Rehabilitation  THERAPY DIAG:  Muscle weakness (generalized)  Unsteadiness on feet  Difficulty in walking, not elsewhere classified  Other lack of coordination  Abnormality of gait and mobility  Difficulty in walking  Chronic bilateral low back pain without sciatica  Joint stiffness  ONSET DATE: 08/27/2022  SUBJECTIVE:                                                                                                                                                                                           SUBJECTIVE STATEMENT:  Pt reports no pain today. States that he is feeling good today with only mild tightness. Has not had much work this week.   PERTINENT HISTORY:  Fall with neck surgery - started it all - damaged vertebrae in my neck- 2021.- s/p ACDF procedure, remote knee scope.   PAIN:  Are you having pain? Yes: NPRS scale: Current= 3/10 and at best; worst = 8/10 Pain location: lower back (right side mostly)  Pain description: ache Aggravating factors: increased time on feet- sore by end of day Relieving factors: Rest, Tylenol, heat occasionally  PRECAUTIONS: Fall  WEIGHT BEARING RESTRICTIONS: No  FALLS:  Has patient fallen in last 6 months? 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   NEXT MD VISIT: No follow up  OBJECTIVE:   DIAGNOSTIC FINDINGS:  CLINICAL DATA:  C-spine fusion   EXAM: CERVICAL SPINE - 2-3 VIEW; DG C-ARM 1-60 MIN   COMPARISON:  MRI 08/26/2019   FINDINGS: Three low resolution intraoperative spot views of the cervical spine. Total fluoroscopy time was 7  seconds.   Initial image demonstrates localizing instrument anterior to C4. Subsequent images demonstrate placement of surgical plate and fixating screws with interbody device at C3-C4.   IMPRESSION: Intraoperative fluoroscopic assistance provided during cervical spine surgery.     Electronically Signed   By: Jasmine Pang M.D.   On: 08/30/2019 16:48    PATIENT SURVEYS:  Modified Oswestry 18  FOTO 47 with goal of 60  SCREENING FOR RED FLAGS: Bowel or bladder incontinence: No Spinal tumors: No Cauda equina syndrome: No Compression fracture: No Abdominal aneurysm: No  COGNITION: Overall cognitive status: Within functional limits for tasks assessed     SENSATION: Light touch: Impaired   MUSCLE LENGTH: Hamstrings: Right 90+ deg; Left 90+ deg   POSTURE: rounded shoulders and forward head  PALPATION: Tenderness throughout right side lumbar region  LUMBAR ROM:   AROM eval  Flexion Fingers to floor  Extension WNL  Right lateral flexion Fingers to mid calf  Left lateral flexion Fingers to mid calf  Right rotation WNL  Left rotation WNL   (Blank rows = not tested)  LOWER EXTREMITY ROM:     All B LE ROM= WNL   LOWER EXTREMITY MMT:    MMT Right eval Left eval R 5/8 L5/8  Hip flexion 3+ 3+ 3+ 4-  Hip extension 3+ 3+    Hip abduction 3+ 3+    Hip adduction 4 4 4 4   Hip internal rotation 3+ 3+ 3+ 3+  Hip external rotation 3+ 3+ 4- 4-  Knee flexion 4 4 4 4   Knee extension 4 4 4+ 4+  Ankle dorsiflexion 4 4 4+ 4+  Ankle plantarflexion 4 4 4 4   Ankle inversion 4 4 4 4   Ankle eversion 4 4 4 4    (Blank rows = not tested)  LUMBAR SPECIAL TESTS:  Straight leg raise test: Negative, Slump test: Negative, Single leg stance test: To be tested next visit, and FABER test: Negative  FUNCTIONAL TESTS:  5 times sit to stand: 19.23 sec without UE support 6 minute walk test: To be evaluated next visit 10 meter  walk test: 15.11 and 15.49 sec without an UE support =  15.3 sec or 0.65 m/s Berg Balance Scale: To be evaluated next visit  GAIT:  Distance walked: approx 100 feet  Assistive device utilized: Single point cane Level of assistance: SBA Comments: Genu varus- excessive Left LE; right LE decreased heel strike with decreased step height and length, Foot adducted   TODAY'S TREATMENT:                                                                                                                               THEREX:  Nustep reciprocal movement and endurance training level Level 3-5.  Cues to keep steps per minute consistent through variable resistance. Standing hip abduction 2 x 12 RTB Standing hip extension 2 x 12 RTB Cues for decreased truncal compensations.  And decreased speed of eccentric movement  Seated hip abduction 2 x 12 RTB Seated hip push downs 2 x 12 RTB  Single limb stance intermittent upper extremity support 2 x 10 seconds bilateral  Standing on Airex pad: Narrow base of support eyes open x 15 seconds eyes closed x 10 seconds, performed x 3 bouts 1 foot on Airex pad 1 foot on 6 inch step wide tandem stance x 30 seconds, narrow tandem stance x 30 seconds performed on bilateral lower extremities. Forward foot taps on 6 inch step 2x 12 bilateral lower extremity Lateral foot taps on 6 inch step 2 x 10 bilateral lower extremity   Contact-guard assist from PT for safety while standing on Airex pad with min cues for hip strategy to prevent lateral loss of balance     PATIENT EDUCATION:  Education details: Pt educated throughout session about proper posture and technique with exercises. Improved exercise technique, movement at target joints, use of target muscles after min to mod verbal, visual, tactile cues.  Person educated: Patient Education method: Explanation, Demonstration, Tactile cues, and Verbal cues Education comprehension: verbalized understanding, returned demonstration, verbal cues required, tactile cues required, and  needs further education  HOME EXERCISE PROGRAM: Reviewed from last episode of care- will add to HEP as appropriate.   ASSESSMENT:  CLINICAL IMPRESSION:  Patient put forth good effort towards therapeutic intervention on this day.  Noted to have decreased strength in hip abduction requiring mild compensation through trunk.  But able to complete with correct technique following instructions from PT.  He was able to tolerate resistive strengthening using small RTB to challenge his single limb stability and hip strength.   Pt will continue to benefit from skilled physical therapy intervention to address impairments, improve QOL, and attain therapy goals.     OBJECTIVE IMPAIRMENTS: Abnormal gait, decreased activity tolerance, decreased balance, decreased endurance, decreased mobility, difficulty walking, decreased ROM, decreased strength, and pain.   ACTIVITY LIMITATIONS: carrying, lifting, bending, sitting, standing, squatting, stairs, transfers, and bed mobility  PARTICIPATION LIMITATIONS: cleaning, laundry, shopping, community activity, and yard work  PERSONAL FACTORS: Time since onset of injury/illness/exacerbation  and 1-2 comorbidities: HTN, Arthritis, past cervical ACDF procedure in 2021  are also affecting patient's functional outcome.   REHAB POTENTIAL: Good  CLINICAL DECISION MAKING: Stable/uncomplicated  EVALUATION COMPLEXITY: Moderate   GOALS: Goals reviewed with patient? Yes  SHORT TERM GOALS: Target date: 08/26/2022  Pt. Independent with HEP to increase B LE strength 1/2 muscle grade to improve standing/ independence with gait.  Baseline: See chart in objective, several increases but some remain the same  Goal status: IN PROGRESS  2.  Pt will decrease worst back pain as reported on NPRS by at least 2 points in order to demonstrate clinically significant reduction in back pain.   Baseline: EVAL= up to 8/10 at worst 08/14/22: 3/10 at worst  Goal status: MET   LONG TERM  GOALS: Target date: 10/07/2022  Pt will improve FOTO to target score  of  60 or greater to display perceived improvements in ability to complete ADL's.  Baseline: EVAL= 47 5/8: 56 Goal status: IN PROGRESS  2.  Pt will decrease MODI score by at least 8 points in order demonstrate clinically significant reduction in back pain/disability.  Baseline: EVAL= 18 5/8: 10 Goal status: IN PROGRESS  3.  Pt will decrease worst back pain as reported on NPRS by at least 3 points in order to demonstrate clinically significant reduction in back pain.   Baseline: EVAL= Up to 8/10 low back pain 08/14/22: 4/10 worst after work  Goal status: IN PROGRESS  4.  Pt will improve FGA by at least 3 points in order to demonstrate clinically significant improvement in balance.   Baseline: EVAL: to be initiated next visit; 07/17/2022= 18/30 08/14/22:19/30 Goal status: IN PROGRESS  5.  Pt will decrease 5TSTS by at least 3 seconds in order to demonstrate clinically significant improvement in LE strength Baseline: EVAL= 19.23 sec  08/14/22:14.38 sec Goal status: MET  6.  Pt will increase by at least 0.13 m/s in order to demonstrate clinically significant improvement in community ambulation.   Baseline: EVAL= 0.65 m/s without UE support  Goal status: MET   7. Patient will improved 6 min walk test > 150 feet for improved gait efficiency and improved overall gait endurance with community activities.    Baseline: 07/17/2022= 1050 feet with cane 5/8: 1230 ft no AD    Goal status: MET  PLAN:  PT FREQUENCY: 1-2x/week  PT DURATION: 12 weeks  PLANNED INTERVENTIONS: Therapeutic exercises, Therapeutic activity, Neuromuscular re-education, Balance training, Gait training, Patient/Family education, Self Care, Joint mobilization, Joint manipulation, Stair training, Vestibular training, Canalith repositioning, Orthotic/Fit training, DME instructions, Dry Needling, Spinal manipulation, Spinal mobilization, Cryotherapy, Moist heat,  Taping, and Manual therapy.  PLAN FOR NEXT SESSION:   Continue with balance, Therex, and gait training next visit.  Reassess HEP and adjust accordingly Manual therapy for LBP as needed   Golden Pop PT  Physical Therapist- [facility]  [facility]   11:08 AM 09/06/22

## 2022-09-09 ENCOUNTER — Ambulatory Visit: Payer: Medicare Other

## 2022-09-11 ENCOUNTER — Ambulatory Visit: Payer: Medicare Other | Attending: Internal Medicine

## 2022-09-11 DIAGNOSIS — R262 Difficulty in walking, not elsewhere classified: Secondary | ICD-10-CM

## 2022-09-11 DIAGNOSIS — M6281 Muscle weakness (generalized): Secondary | ICD-10-CM | POA: Diagnosis not present

## 2022-09-11 DIAGNOSIS — M2560 Stiffness of unspecified joint, not elsewhere classified: Secondary | ICD-10-CM

## 2022-09-11 DIAGNOSIS — R269 Unspecified abnormalities of gait and mobility: Secondary | ICD-10-CM

## 2022-09-11 DIAGNOSIS — M545 Low back pain, unspecified: Secondary | ICD-10-CM

## 2022-09-11 DIAGNOSIS — R278 Other lack of coordination: Secondary | ICD-10-CM | POA: Diagnosis not present

## 2022-09-11 DIAGNOSIS — R2681 Unsteadiness on feet: Secondary | ICD-10-CM | POA: Diagnosis not present

## 2022-09-11 DIAGNOSIS — G8929 Other chronic pain: Secondary | ICD-10-CM | POA: Diagnosis not present

## 2022-09-11 NOTE — Therapy (Signed)
OUTPATIENT PHYSICAL THERAPY THORACOLUMBAR VISIT    Patient Name: Larry Blair MRN: [id number] DOB:1955/04/21, 67 y.o., male Today's Date: 09/11/2022  END OF SESSION:  PT End of Session - 09/11/22 1326     Visit Number 17    Number of Visits 24    Date for PT Re-Evaluation 10/07/22    Progress Note Due on Visit 20    PT Start Time 1432    PT Stop Time 1503    PT Time Calculation (min) 31 min    Equipment Utilized During Treatment Gait belt    Activity Tolerance Patient tolerated treatment well;No increased pain    Behavior During Therapy WFL for tasks assessed/performed                    Past Medical History:  Diagnosis Date   Allergy    Arthritis    right shoulder   Asthma    Eczema    HTN (hypertension)    Hyperlipidemia    Substance abuse (HCC) 1976   daily   Past Surgical History:  Procedure Laterality Date   ANTERIOR CERVICAL DECOMP/DISCECTOMY FUSION N/A 08/30/2019   Procedure: CERVICAL THREE-FOUR ANTERIOR CERVICAL DECOMPRESSION/FUSION;  Surgeon: Tia Alert, MD;  Location: [facility] OR;  Service: Neurosurgery;  Laterality: N/A;   COLONOSCOPY     KNEE ARTHROSCOPY Left    Patient Active Problem List   Diagnosis Date Noted   Encounter for general adult medical examination with abnormal findings 07/04/2022   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 Sanda Linger  REFERRING PROVIDER: Dr. Sanda Linger  REFERRING DIAG: Chronic bilateral Low back pain without Sciatica  Rationale for Evaluation and Treatment:  Rehabilitation  THERAPY DIAG:  Muscle weakness (generalized)  Unsteadiness on feet  Difficulty in walking, not elsewhere classified  Other lack of coordination  Abnormality of gait and mobility  Difficulty in walking  Chronic bilateral low back pain without sciatica  Joint stiffness  ONSET DATE: 08/27/2022  SUBJECTIVE:                                                                                                                                                                                           SUBJECTIVE STATEMENT:  Pt reports increased overall low back pain since Monday - can't relate it to anything specific but bothering him- rates up to 3-4/10 today.   PERTINENT HISTORY:  Fall with neck surgery - started it all - damaged vertebrae in my neck- 2021.- s/p ACDF procedure, remote knee scope.   PAIN:  Are you having pain? Yes: NPRS scale: Current= 3/10 and at best; worst = 8/10 Pain location: lower back (right side mostly)  Pain description: ache Aggravating factors: increased time on feet- sore by end of day Relieving factors: Rest, Tylenol, heat occasionally  PRECAUTIONS: Fall  WEIGHT BEARING RESTRICTIONS: No  FALLS:  Has patient fallen in last 6 months? 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   NEXT MD VISIT: No follow up  OBJECTIVE:   DIAGNOSTIC FINDINGS:  CLINICAL DATA:  C-spine fusion   EXAM: CERVICAL SPINE - 2-3 VIEW; DG C-ARM 1-60 MIN   COMPARISON:  MRI 08/26/2019   FINDINGS: Three low resolution intraoperative spot views of the cervical spine. Total  fluoroscopy time was 7 seconds.   Initial image demonstrates localizing instrument anterior to C4. Subsequent images demonstrate placement of surgical plate and fixating screws with interbody device at C3-C4.   IMPRESSION: Intraoperative fluoroscopic assistance provided during cervical spine surgery.     Electronically Signed   By: Jasmine Pang M.D.   On: 08/30/2019 16:48    PATIENT SURVEYS:  Modified Oswestry 18  FOTO 47 with goal of 60  SCREENING FOR RED FLAGS: Bowel or bladder incontinence: No Spinal tumors: No Cauda equina syndrome: No Compression fracture: No Abdominal aneurysm: No  COGNITION: Overall cognitive status: Within functional limits for tasks assessed     SENSATION: Light touch: Impaired   MUSCLE LENGTH: Hamstrings: Right 90+ deg; Left 90+ deg   POSTURE: rounded shoulders and forward head  PALPATION: Tenderness throughout right side lumbar region  LUMBAR ROM:   AROM eval  Flexion Fingers to floor  Extension WNL  Right lateral flexion Fingers to mid calf  Left lateral flexion Fingers to mid calf  Right rotation WNL  Left rotation WNL   (Blank rows = not tested)  LOWER EXTREMITY ROM:     All B LE ROM= WNL   LOWER EXTREMITY MMT:    MMT Right eval Left eval R 5/8 L5/8  Hip flexion 3+ 3+ 3+ 4-  Hip extension 3+ 3+    Hip abduction 3+ 3+    Hip adduction 4 4 4 4   Hip internal rotation 3+ 3+ 3+ 3+  Hip external rotation 3+ 3+ 4- 4-  Knee flexion 4 4 4 4   Knee extension 4 4 4+ 4+  Ankle dorsiflexion 4 4 4+ 4+  Ankle plantarflexion 4 4 4 4   Ankle inversion 4 4 4 4   Ankle eversion 4 4 4 4    (Blank rows = not tested)  LUMBAR SPECIAL TESTS:  Straight leg raise test: Negative, Slump test: Negative, Single leg stance test: To be tested next visit, and FABER test: Negative  FUNCTIONAL TESTS:  5 times sit to stand: 19.23 sec without UE support 6 minute walk test: To be evaluated next visit 10  meter walk test: 15.11 and 15.49 sec  without an UE support = 15.3 sec or 0.65 m/s Berg Balance Scale: To be evaluated next visit  GAIT:  Distance walked: approx 100 feet  Assistive device utilized: Single point cane Level of assistance: SBA Comments: Genu varus- excessive Left LE; right LE decreased heel strike with decreased step height and length, Foot adducted   TODAY'S TREATMENT:                                                                                                                                *Assessed patient and he presented with increased pain along right sided lumbar paraspinals and into right gluteal region.   Manual therapy:  Knee to chest- hold 30 sec x 3 each LE Hamstring stretching x 30 sec hold x 3 each LE Sciatic nerve glides with HS stretch x 10 reps in 3 varying positions. Hip circles - CW/CCW x 10 each LE x each direction Lower trunk rotation x 30 sec hold x 4 each LE Long axis LE distraction Right LE x 60 sec hold x 2 PA Grade 2-3 vertebral L2-5 mobs- x 20 bouts each with some increased tenderness along L3 region.  STM to Right side paraspinals in prone x several min with increased tightness- improved with mobilization.  Application of biofreeze to low back region x 1 min (unbilled)  Patient reported feeling better after treatment and states now at 1-2/10 LBP.        PATIENT EDUCATION:  Education details: Pt educated throughout session about proper posture and technique with exercises. Improved exercise technique, movement at target joints, use of target muscles after min to mod verbal, visual, tactile cues.  Person educated: Patient Education method: Explanation, Demonstration, Tactile cues, and Verbal cues Education comprehension: verbalized understanding, returned demonstration, verbal cues required, tactile cues required, and needs further education  HOME EXERCISE PROGRAM: Reviewed from last episode of care- will add to HEP as appropriate.   ASSESSMENT:  CLINICAL  IMPRESSION:  Treatment altered from original plan of care due to onset of increased low back pain since last visit. He did report some relief after manual therapy and able to demo improved gait with less antalgia after session. Will plan to resume balance activities and continue with LE strengthening if pain improved on next visit.  Pt will continue to benefit from skilled physical therapy intervention to address impairments, improve QOL, and attain therapy goals.     OBJECTIVE IMPAIRMENTS: Abnormal gait, decreased activity tolerance, decreased balance, decreased endurance, decreased mobility, difficulty walking, decreased ROM, decreased strength, and pain.   ACTIVITY LIMITATIONS: carrying, lifting, bending, sitting, standing, squatting, stairs, transfers, and bed mobility  PARTICIPATION LIMITATIONS: cleaning, laundry, shopping, community activity, and yard work  PERSONAL FACTORS: Time since onset of injury/illness/exacerbation and 1-2 comorbidities: HTN, Arthritis, past cervical ACDF procedure in 2021  are also affecting patient's functional outcome.   REHAB POTENTIAL: Good  CLINICAL DECISION MAKING: Stable/uncomplicated  EVALUATION COMPLEXITY: Moderate   GOALS: Goals reviewed with patient? Yes  SHORT TERM GOALS: Target date: 08/26/2022  Pt. Independent with HEP to increase B LE strength 1/2 muscle grade to improve standing/ independence with gait.  Baseline: See chart in objective, several increases but some remain the same  Goal status: IN PROGRESS  2.  Pt will decrease worst back pain as reported on NPRS by at least 2 points in order to demonstrate clinically significant reduction in back pain.   Baseline: EVAL= up to 8/10 at worst 08/14/22: 3/10 at worst  Goal status: MET   LONG TERM GOALS: Target date: 10/07/2022  Pt will improve FOTO to target score  of  60 or greater to display perceived improvements in ability to complete ADL's.  Baseline: EVAL= 47 5/8: 56 Goal status: IN  PROGRESS  2.  Pt will decrease MODI score by at least 8 points in order demonstrate clinically significant reduction in back pain/disability.  Baseline: EVAL= 18 5/8: 10 Goal status: IN PROGRESS  3.  Pt will decrease worst back pain as reported on NPRS by at least 3 points in order to demonstrate clinically significant reduction in back pain.   Baseline: EVAL= Up to 8/10 low back pain 08/14/22: 4/10 worst after work  Goal status: IN PROGRESS  4.  Pt will improve FGA by at least 3 points in order to demonstrate clinically significant improvement in balance.   Baseline: EVAL: to be initiated next visit; 07/17/2022= 18/30 08/14/22:19/30 Goal status: IN PROGRESS  5.  Pt will decrease 5TSTS by at least 3 seconds in order to demonstrate clinically significant improvement in LE strength Baseline: EVAL= 19.23 sec  08/14/22:14.38 sec Goal status: MET  6.  Pt will increase by at least 0.13 m/s in order to demonstrate clinically significant improvement in community ambulation.   Baseline: EVAL= 0.65 m/s without UE support  Goal status: MET   7. Patient will improved 6 min walk test > 150 feet for improved gait efficiency and improved overall gait endurance with community activities.    Baseline: 07/17/2022= 1050 feet with cane 5/8: 1230 ft no AD    Goal status: MET  PLAN:  PT FREQUENCY: 1-2x/week  PT DURATION: 12 weeks  PLANNED INTERVENTIONS: Therapeutic exercises, Therapeutic activity, Neuromuscular re-education, Balance training, Gait training, Patient/Family education, Self Care, Joint mobilization, Joint manipulation, Stair training, Vestibular training, Canalith repositioning, Orthotic/Fit training, DME instructions, Dry Needling, Spinal manipulation, Spinal mobilization, Cryotherapy, Moist heat, Taping, and Manual therapy.  PLAN FOR NEXT SESSION:   Continue with balance, Therex, and gait training next visit.  Reassess HEP and adjust accordingly Manual therapy for LBP as needed    Lenda Kelp PT  Physical Therapist- [facility]  [facility]   3:25 PM 09/11/22

## 2022-09-16 ENCOUNTER — Ambulatory Visit: Payer: Medicare Other

## 2022-09-16 DIAGNOSIS — R269 Unspecified abnormalities of gait and mobility: Secondary | ICD-10-CM

## 2022-09-16 DIAGNOSIS — G8929 Other chronic pain: Secondary | ICD-10-CM | POA: Diagnosis not present

## 2022-09-16 DIAGNOSIS — M2560 Stiffness of unspecified joint, not elsewhere classified: Secondary | ICD-10-CM

## 2022-09-16 DIAGNOSIS — R278 Other lack of coordination: Secondary | ICD-10-CM | POA: Diagnosis not present

## 2022-09-16 DIAGNOSIS — M6281 Muscle weakness (generalized): Secondary | ICD-10-CM

## 2022-09-16 DIAGNOSIS — R2681 Unsteadiness on feet: Secondary | ICD-10-CM

## 2022-09-16 DIAGNOSIS — R262 Difficulty in walking, not elsewhere classified: Secondary | ICD-10-CM | POA: Diagnosis not present

## 2022-09-16 DIAGNOSIS — M545 Low back pain, unspecified: Secondary | ICD-10-CM | POA: Diagnosis not present

## 2022-09-16 NOTE — Therapy (Signed)
OUTPATIENT PHYSICAL THERAPY THORACOLUMBAR VISIT    Patient Name: Larry Blair MRN: [id number] DOB:1955-06-19, 67 y.o., male Today's Date: 09/11/2022  END OF SESSION:  PT End of Session - 09/11/22 1326     Visit Number 17    Number of Visits 24    Date for PT Re-Evaluation 10/07/22    Progress Note Due on Visit 20    PT Start Time 1432    PT Stop Time 1503    PT Time Calculation (min) 31 min    Equipment Utilized During Treatment Gait belt    Activity Tolerance Patient tolerated treatment well;No increased pain    Behavior During Therapy WFL for tasks assessed/performed                    Past Medical History:  Diagnosis Date   Allergy    Arthritis    right shoulder   Asthma    Eczema    HTN (hypertension)    Hyperlipidemia    Substance abuse (HCC) 1976   daily   Past Surgical History:  Procedure Laterality Date   ANTERIOR CERVICAL DECOMP/DISCECTOMY FUSION N/A 08/30/2019   Procedure: CERVICAL THREE-FOUR ANTERIOR CERVICAL DECOMPRESSION/FUSION;  Surgeon: Tia Alert, MD;  Location: [facility] OR;  Service: Neurosurgery;  Laterality: N/A;   COLONOSCOPY     KNEE ARTHROSCOPY Left    Patient Active Problem List   Diagnosis Date Noted   Encounter for general adult medical examination with abnormal findings 07/04/2022   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 Sanda Linger  REFERRING PROVIDER: Dr. Sanda Linger  REFERRING DIAG: Chronic bilateral Low back pain without Sciatica  Rationale for Evaluation and Treatment:  Rehabilitation  THERAPY DIAG:  Muscle weakness (generalized)  Unsteadiness on feet  Difficulty in walking, not elsewhere classified  Other lack of coordination  Abnormality of gait and mobility  Difficulty in walking  Chronic bilateral low back pain without sciatica  Joint stiffness  ONSET DATE: 08/27/2022  SUBJECTIVE:                                                                                                                                                                                           SUBJECTIVE STATEMENT:  Patient reports feeling better overall and was able to step up onto a platform (chair height) to do some work on a jeep and was able to pull up using his right LE which he had been previously unable.     PERTINENT HISTORY:  Fall with neck surgery - started it all - damaged vertebrae in my neck- 2021.- s/p ACDF procedure, remote knee scope.   PAIN:  Are you having pain? No  PRECAUTIONS: Fall  WEIGHT BEARING RESTRICTIONS: No  FALLS:  Has patient fallen in last 6 months? 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   NEXT MD VISIT: No follow up  OBJECTIVE:   DIAGNOSTIC FINDINGS:  CLINICAL DATA:  C-spine fusion   EXAM: CERVICAL SPINE - 2-3 VIEW; DG C-ARM 1-60 MIN   COMPARISON:  MRI 08/26/2019   FINDINGS: Three low resolution intraoperative spot views of the cervical spine. Total fluoroscopy time was 7 seconds.   Initial image demonstrates localizing instrument anterior to C4. Subsequent images demonstrate placement of surgical plate and fixating screws with  interbody device at C3-C4.   IMPRESSION: Intraoperative fluoroscopic assistance provided during cervical spine surgery.     Electronically Signed   By: Jasmine Pang M.D.   On: 08/30/2019 16:48    PATIENT SURVEYS:  Modified Oswestry 18  FOTO 47 with goal of 60  SCREENING FOR RED FLAGS: Bowel or bladder incontinence: No Spinal tumors: No Cauda equina syndrome: No Compression fracture: No Abdominal aneurysm: No  COGNITION: Overall cognitive status: Within functional limits for tasks assessed     SENSATION: Light touch: Impaired   MUSCLE LENGTH: Hamstrings: Right 90+ deg; Left 90+ deg   POSTURE: rounded shoulders and forward head  PALPATION: Tenderness throughout right side lumbar region  LUMBAR ROM:   AROM eval  Flexion Fingers to floor  Extension WNL  Right lateral flexion Fingers to mid calf  Left lateral flexion Fingers to mid calf  Right rotation WNL  Left rotation WNL   (Blank rows = not tested)  LOWER EXTREMITY ROM:     All B LE ROM= WNL   LOWER EXTREMITY MMT:    MMT Right eval Left eval R 5/8 L5/8  Hip flexion 3+ 3+ 3+ 4-  Hip extension 3+ 3+    Hip abduction 3+ 3+    Hip adduction 4 4 4 4   Hip internal rotation 3+ 3+ 3+ 3+  Hip external rotation 3+ 3+ 4- 4-  Knee flexion 4 4 4 4   Knee extension 4 4 4+ 4+  Ankle dorsiflexion 4 4 4+ 4+  Ankle plantarflexion 4 4 4 4   Ankle inversion 4 4 4 4   Ankle eversion 4 4 4 4    (Blank rows = not tested)  LUMBAR SPECIAL TESTS:  Straight leg raise test: Negative, Slump test: Negative, Single leg stance test: To be tested next visit, and FABER test: Negative  FUNCTIONAL TESTS:  5 times sit to stand: 19.23 sec without UE support 6 minute walk test: To be evaluated next visit 10 meter walk test: 15.11 and 15.49 sec without an UE support = 15.3 sec or 0.65 m/s Berg Balance Scale: To  be evaluated next visit  GAIT:  Distance walked: approx 100 feet  Assistive device utilized: Single point cane Level  of assistance: SBA Comments: Genu varus- excessive Left LE; right LE decreased heel strike with decreased step height and length, Foot adducted   TODAY'S TREATMENT:                                                                                                                                NMR: without UE support in // bars with CGA and use of gait belt  Step up with 5# AW onto 6" block Side step up/over 1/2 foam with 5# AW Heel to toe gait  sequencing (5#AW) heel strike on floor with toe pad landing on 1/2 foam Gait in // bars- focusing on widening base of support and no IR or pigeon toe gait - keeping each LE Lateral of center tape line- wearing 5# AW x 10 down and retro steps backward.  Gait with SPC-         PATIENT EDUCATION:  Education details: Pt educated throughout session about proper posture and technique with exercises. Improved exercise technique, movement at target joints, use of target muscles after min to mod verbal, visual, tactile cues.  Person educated: Patient Education method: Explanation, Demonstration, Tactile cues, and Verbal cues Education comprehension: verbalized understanding, returned demonstration, verbal cues required, tactile cues required, and needs further education  HOME EXERCISE PROGRAM: Reviewed from last episode of care- will add to HEP as appropriate.   ASSESSMENT:  CLINICAL IMPRESSION:  Treatment altered from original plan of care due to onset of increased low back pain since last visit. He did report some relief after manual therapy and able to demo improved gait with less antalgia after session. Will plan to resume balance activities and continue with LE strengthening if pain improved on next visit.  Pt will continue to benefit from skilled physical therapy intervention to address impairments, improve QOL, and attain therapy goals.     OBJECTIVE IMPAIRMENTS: Abnormal gait, decreased activity tolerance, decreased balance, decreased  endurance, decreased mobility, difficulty walking, decreased ROM, decreased strength, and pain.   ACTIVITY LIMITATIONS: carrying, lifting, bending, sitting, standing, squatting, stairs, transfers, and bed mobility  PARTICIPATION LIMITATIONS: cleaning, laundry, shopping, community activity, and yard work  PERSONAL FACTORS: Time since onset of injury/illness/exacerbation and 1-2 comorbidities: HTN, Arthritis, past cervical ACDF procedure in 2021  are also affecting patient's functional outcome.   REHAB POTENTIAL: Good  CLINICAL DECISION MAKING: Stable/uncomplicated  EVALUATION COMPLEXITY: Moderate   GOALS: Goals reviewed with patient? Yes  SHORT TERM GOALS: Target date: 08/26/2022  Pt. Independent with HEP to increase B LE strength 1/2 muscle grade to improve standing/ independence with gait.  Baseline: See chart in objective, several increases but some remain the same  Goal status: IN PROGRESS  2.  Pt will decrease worst back pain as reported on NPRS by at least 2 points in order to demonstrate clinically significant  reduction in back pain.   Baseline: EVAL= up to 8/10 at worst 08/14/22: 3/10 at worst  Goal status: MET   LONG TERM GOALS: Target date: 10/07/2022  Pt will improve FOTO to target score  of  60 or greater to display perceived improvements in ability to complete ADL's.  Baseline: EVAL= 47 5/8: 56 Goal status: IN PROGRESS  2.  Pt will decrease MODI score by at least 8 points in order demonstrate clinically significant reduction in back pain/disability.  Baseline: EVAL= 18 5/8: 10 Goal status: IN PROGRESS  3.  Pt will decrease worst back pain as reported on NPRS by at least 3 points in order to demonstrate clinically significant reduction in back pain.   Baseline: EVAL= Up to 8/10 low back pain 08/14/22: 4/10 worst after work  Goal status: IN PROGRESS  4.  Pt will improve FGA by at least 3 points in order to demonstrate clinically significant improvement in balance.    Baseline: EVAL: to be initiated next visit; 07/17/2022= 18/30 08/14/22:19/30 Goal status: IN PROGRESS  5.  Pt will decrease 5TSTS by at least 3 seconds in order to demonstrate clinically significant improvement in LE strength Baseline: EVAL= 19.23 sec  08/14/22:14.38 sec Goal status: MET  6.  Pt will increase by at least 0.13 m/s in order to demonstrate clinically significant improvement in community ambulation.   Baseline: EVAL= 0.65 m/s without UE support  Goal status: MET   7. Patient will improved 6 min walk test > 150 feet for improved gait efficiency and improved overall gait endurance with community activities.    Baseline: 07/17/2022= 1050 feet with cane 5/8: 1230 ft no AD    Goal status: MET  PLAN:  PT FREQUENCY: 1-2x/week  PT DURATION: 12 weeks  PLANNED INTERVENTIONS: Therapeutic exercises, Therapeutic activity, Neuromuscular re-education, Balance training, Gait training, Patient/Family education, Self Care, Joint mobilization, Joint manipulation, Stair training, Vestibular training, Canalith repositioning, Orthotic/Fit training, DME instructions, Dry Needling, Spinal manipulation, Spinal mobilization, Cryotherapy, Moist heat, Taping, and Manual therapy.  PLAN FOR NEXT SESSION:   Continue with balance, Therex, and gait training next visit.  Reassess HEP and adjust accordingly Manual therapy for LBP as needed   Lenda Kelp PT  Physical Therapist- [facility]  [facility]   3:25 PM 09/11/22

## 2022-09-18 ENCOUNTER — Encounter: Payer: Self-pay | Admitting: Physical Therapy

## 2022-09-18 ENCOUNTER — Ambulatory Visit: Payer: Medicare Other | Admitting: Physical Therapy

## 2022-09-18 DIAGNOSIS — R278 Other lack of coordination: Secondary | ICD-10-CM

## 2022-09-18 DIAGNOSIS — M2560 Stiffness of unspecified joint, not elsewhere classified: Secondary | ICD-10-CM | POA: Diagnosis not present

## 2022-09-18 DIAGNOSIS — M545 Low back pain, unspecified: Secondary | ICD-10-CM | POA: Diagnosis not present

## 2022-09-18 DIAGNOSIS — R2681 Unsteadiness on feet: Secondary | ICD-10-CM | POA: Diagnosis not present

## 2022-09-18 DIAGNOSIS — R269 Unspecified abnormalities of gait and mobility: Secondary | ICD-10-CM | POA: Diagnosis not present

## 2022-09-18 DIAGNOSIS — R262 Difficulty in walking, not elsewhere classified: Secondary | ICD-10-CM

## 2022-09-18 DIAGNOSIS — G8929 Other chronic pain: Secondary | ICD-10-CM | POA: Diagnosis not present

## 2022-09-18 DIAGNOSIS — M6281 Muscle weakness (generalized): Secondary | ICD-10-CM | POA: Diagnosis not present

## 2022-09-18 NOTE — Therapy (Deleted)
OUTPATIENT PHYSICAL THERAPY THORACOLUMBAR VISIT    Patient Name: Larry Blair MRN: [id number] DOB:November 04, 1955, 67 y.o., male Today's Date: 09/18/2022  END OF SESSION:           Past Medical History:  Diagnosis Date   Allergy    Arthritis    right shoulder   Asthma    Eczema    HTN (hypertension)    Hyperlipidemia    Substance abuse (HCC) 1976   daily   Past Surgical History:  Procedure Laterality Date   ANTERIOR CERVICAL DECOMP/DISCECTOMY FUSION N/A 08/30/2019   Procedure: CERVICAL THREE-FOUR ANTERIOR CERVICAL DECOMPRESSION/FUSION;  Surgeon: Tia Alert, MD;  Location: [facility] OR;  Service: Neurosurgery;  Laterality: N/A;   COLONOSCOPY     KNEE ARTHROSCOPY Left    Patient Active Problem List   Diagnosis Date Noted   Encounter for general adult medical examination with abnormal findings 07/04/2022   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 Sanda Linger  REFERRING PROVIDER: Dr. Sanda Linger  REFERRING DIAG: Chronic bilateral Low back pain without Sciatica  Rationale for Evaluation and Treatment: Rehabilitation  THERAPY DIAG:  No diagnosis found.  ONSET DATE: 08/27/2022  SUBJECTIVE:                                                                                                                                                                                           SUBJECTIVE STATEMENT:  Patient reports feeling better overall and was able to step up onto a platform (chair height) to do some work on a jeep and was  able to pull up using his right LE which he had been previously unable.     PERTINENT HISTORY:  Fall with neck surgery - started it all - damaged vertebrae in my neck- 2021.- s/p ACDF procedure, remote knee scope.   PAIN:  Are you having pain? No  PRECAUTIONS: Fall  WEIGHT BEARING RESTRICTIONS: No  FALLS:  Has patient fallen in last 6 months? 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   NEXT MD VISIT: No follow up  OBJECTIVE:   DIAGNOSTIC FINDINGS:  CLINICAL DATA:  C-spine fusion   EXAM: CERVICAL SPINE - 2-3 VIEW; DG C-ARM 1-60 MIN   COMPARISON:  MRI 08/26/2019   FINDINGS: Three low resolution intraoperative spot views of the cervical spine. Total fluoroscopy time was 7 seconds.   Initial image demonstrates localizing instrument anterior to C4. Subsequent images demonstrate placement of surgical plate and fixating screws with interbody device at C3-C4.   IMPRESSION: Intraoperative fluoroscopic assistance provided during cervical spine surgery.     Electronically Signed   By: Jasmine Pang M.D.   On: 08/30/2019 16:48    PATIENT SURVEYS:  Modified Oswestry 18  FOTO 47 with goal of 60  SCREENING FOR RED FLAGS: Bowel or bladder incontinence: No Spinal tumors: No Cauda equina syndrome: No Compression fracture: No Abdominal aneurysm: No  COGNITION: Overall cognitive status: Within functional limits for tasks assessed     SENSATION: Light touch: Impaired   MUSCLE LENGTH: Hamstrings: Right 90+ deg; Left 90+ deg   POSTURE: rounded shoulders and forward  head  PALPATION: Tenderness throughout right side lumbar region  LUMBAR ROM:   AROM eval  Flexion Fingers to floor  Extension WNL  Right lateral flexion Fingers to mid calf  Left lateral flexion Fingers to mid calf  Right rotation WNL  Left rotation WNL   (Blank rows = not tested)  LOWER EXTREMITY ROM:     All B LE ROM= WNL   LOWER EXTREMITY MMT:    MMT Right eval Left eval R 5/8 L5/8  Hip flexion 3+ 3+ 3+ 4-  Hip extension 3+ 3+    Hip abduction 3+ 3+    Hip adduction 4 4 4 4   Hip internal rotation 3+ 3+ 3+ 3+  Hip external rotation 3+ 3+ 4- 4-  Knee flexion 4 4 4 4   Knee extension 4 4 4+ 4+  Ankle dorsiflexion 4 4 4+ 4+  Ankle plantarflexion 4 4 4 4   Ankle inversion 4 4 4 4   Ankle eversion 4 4 4 4    (Blank rows = not tested)  LUMBAR SPECIAL TESTS:  Straight leg raise test: Negative, Slump test: Negative, Single leg stance test: To be tested next visit, and FABER test: Negative  FUNCTIONAL TESTS:  5 times sit to stand: 19.23 sec without UE support 6 minute walk test: To be evaluated next visit 10 meter walk test: 15.11 and 15.49 sec without an UE support = 15.3 sec or 0.65 m/s Berg Balance Scale: To be evaluated next visit  GAIT:  Distance walked: approx 100 feet  Assistive device utilized: Single point cane Level of assistance: SBA Comments: Genu varus- excessive Left LE; right LE decreased heel strike with decreased step height and length, Foot adducted   TODAY'S TREATMENT:             09/16/2022  NMR: without UE support in // bars with CGA and use of gait belt  Step up with 5# AW onto 6" block x 20 reps each LE without UE support Side step up/over 1/2 foam with 5# AW x 20 reps each LE without UE support Heel to toe gait  sequencing (5#AW) heel strike on floor with toe pad landing on 1/2 foam x 25 reps each LE Gait in // bars- focusing on  widening base of support and no IR/pigeon toe gait - keeping each LE Lateral of center tape line- wearing 5# AW x 10 down and retro steps backward x 10.  Gait with SPC- and 5# AW x 160 feet with CGA - patient able to clear each foot well with no LOB- Only cited fatigue as limiting factor.         PATIENT EDUCATION:  Education details: Pt educated throughout session about proper posture and technique with exercises. Improved exercise technique, movement at target joints, use of target muscles after min to mod verbal, visual, tactile cues.  Person educated: Patient Education method: Explanation, Demonstration, Tactile cues, and Verbal cues Education comprehension: verbalized understanding, returned demonstration, verbal cues required, tactile cues required, and needs further education  HOME EXERCISE PROGRAM: Reviewed from last episode of care- will add to HEP as appropriate.   ASSESSMENT:  CLINICAL IMPRESSION:  Treatment able to resume back to standing LE strengthening and balance training as patient reported back feeling much better this week. He was challenged with widening of feet walking activity. He was able to demonstrate progress as seen by ability to consistently pick up each foot despite using 5# AW. He was able to demo improved heel to toe gait after practicing in // bars. Pt will continue to benefit from skilled physical therapy intervention to address impairments, improve QOL, and attain therapy goals.     OBJECTIVE IMPAIRMENTS: Abnormal gait, decreased activity tolerance, decreased balance, decreased endurance, decreased mobility, difficulty walking, decreased ROM, decreased strength, and pain.   ACTIVITY LIMITATIONS: carrying, lifting, bending, sitting, standing, squatting, stairs, transfers, and bed mobility  PARTICIPATION LIMITATIONS: cleaning, laundry, shopping, community activity, and yard work  PERSONAL FACTORS: Time since onset of injury/illness/exacerbation and 1-2  comorbidities: HTN, Arthritis, past cervical ACDF procedure in 2021  are also affecting patient's functional outcome.   REHAB POTENTIAL: Good  CLINICAL DECISION MAKING: Stable/uncomplicated  EVALUATION COMPLEXITY: Moderate   GOALS: Goals reviewed with patient? Yes  SHORT TERM GOALS: Target date: 08/26/2022  Pt. Independent with HEP to increase B LE strength 1/2 muscle grade to improve standing/ independence with gait.  Baseline: See chart in objective, several increases but some remain the same  Goal status: IN PROGRESS  2.  Pt will decrease worst back pain as reported on NPRS by at least 2 points in order to demonstrate clinically significant reduction in back pain.   Baseline: EVAL= up to 8/10 at worst 08/14/22: 3/10 at worst  Goal status: MET   LONG TERM GOALS: Target date: 10/07/2022  Pt will improve FOTO to target score  of  60 or greater to display perceived improvements in ability to complete ADL's.  Baseline: EVAL= 47 5/8: 56 Goal status: IN PROGRESS  2.  Pt will decrease MODI score by at least 8 points in order demonstrate clinically significant reduction in back pain/disability.  Baseline: EVAL= 18 5/8: 10 Goal status: IN PROGRESS  3.  Pt will decrease worst back pain as reported on NPRS by at least 3 points in order to demonstrate clinically  significant reduction in back pain.   Baseline: EVAL= Up to 8/10 low back pain 08/14/22: 4/10 worst after work  Goal status: IN PROGRESS  4.  Pt will improve FGA by at least 3 points in order to demonstrate clinically significant improvement in balance.   Baseline: EVAL: to be initiated next visit; 07/17/2022= 18/30 08/14/22:19/30 Goal status: IN PROGRESS  5.  Pt will decrease 5TSTS by at least 3 seconds in order to demonstrate clinically significant improvement in LE strength Baseline: EVAL= 19.23 sec  08/14/22:14.38 sec Goal status: MET  6.  Pt will increase by at least 0.13 m/s in order to demonstrate clinically significant  improvement in community ambulation.   Baseline: EVAL= 0.65 m/s without UE support  Goal status: MET   7. Patient will improved 6 min walk test > 150 feet for improved gait efficiency and improved overall gait endurance with community activities.    Baseline: 07/17/2022= 1050 feet with cane 5/8: 1230 ft no AD    Goal status: MET  PLAN:  PT FREQUENCY: 1-2x/week  PT DURATION: 12 weeks  PLANNED INTERVENTIONS: Therapeutic exercises, Therapeutic activity, Neuromuscular re-education, Balance training, Gait training, Patient/Family education, Self Care, Joint mobilization, Joint manipulation, Stair training, Vestibular training, Canalith repositioning, Orthotic/Fit training, DME instructions, Dry Needling, Spinal manipulation, Spinal mobilization, Cryotherapy, Moist heat, Taping, and Manual therapy.  PLAN FOR NEXT SESSION:   Continue with balance, Therex, and gait training next visit.  Reassess HEP and adjust accordingly Manual therapy for LBP as needed   Norman Herrlich PT  Physical Therapist- [facility]  [facility]   2:10 PM 09/18/22

## 2022-09-18 NOTE — Therapy (Signed)
OUTPATIENT PHYSICAL THERAPY THORACOLUMBAR VISIT    Patient Name: Larry Blair MRN: [id number] DOB:1955-12-31, 67 y.o., male Today's Date: 09/18/2022  END OF SESSION:  PT End of Session - 09/18/22 1438     Visit Number 19    Number of Visits 24    Date for PT Re-Evaluation 10/07/22    Progress Note Due on Visit 20    PT Start Time 1434    PT Stop Time 1514    PT Time Calculation (min) 40 min    Equipment Utilized During Treatment Gait belt    Activity Tolerance Patient tolerated treatment well;No increased pain    Behavior During Therapy WFL for tasks assessed/performed                     Past Medical History:  Diagnosis Date   Allergy    Arthritis    right shoulder   Asthma    Eczema    HTN (hypertension)    Hyperlipidemia    Substance abuse (HCC) 1976   daily   Past Surgical History:  Procedure Laterality Date   ANTERIOR CERVICAL DECOMP/DISCECTOMY FUSION N/A 08/30/2019   Procedure: CERVICAL THREE-FOUR ANTERIOR CERVICAL DECOMPRESSION/FUSION;  Surgeon: Tia Alert, MD;  Location: [facility] OR;  Service: Neurosurgery;  Laterality: N/A;   COLONOSCOPY     KNEE ARTHROSCOPY Left    Patient Active Problem List   Diagnosis Date Noted   Encounter for general adult medical examination with abnormal findings 07/04/2022   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 Sanda Linger  REFERRING PROVIDER: Dr. Sanda Linger  REFERRING DIAG: Chronic bilateral Low back pain without Sciatica  Rationale for Evaluation and Treatment:  Rehabilitation  THERAPY DIAG:  Muscle weakness (generalized)  Unsteadiness on feet  Difficulty in walking, not elsewhere classified  Other lack of coordination  Abnormality of gait and mobility  ONSET DATE: 08/27/2022  SUBJECTIVE:                                                                                                                                                                                           SUBJECTIVE STATEMENT: Pt reports doin well since last esion, 2.3 mils Patient reports doing well since last  session atient reports feeling better overall and was able to step up onto a platform (chair height) to do some work on a jeep and was able to pull up using his right LE which he had been previously unable.     PERTINENT HISTORY:  Fall with neck surgery - started it all - damaged vertebrae in my neck- 2021.- s/p ACDF procedure, remote knee scope.   PAIN:  Are you having pain? No  PRECAUTIONS: Fall  WEIGHT BEARING RESTRICTIONS: No  FALLS:  Has patient fallen in last 6 months? 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   NEXT MD VISIT: No follow up  OBJECTIVE:   DIAGNOSTIC FINDINGS:  CLINICAL DATA:  C-spine fusion   EXAM: CERVICAL SPINE - 2-3 VIEW; DG C-ARM 1-60 MIN   COMPARISON:  MRI 08/26/2019   FINDINGS: Three low resolution intraoperative spot views of the cervical spine. Total fluoroscopy time was 7 seconds.   Initial image demonstrates localizing instrument anterior to C4. Subsequent images demonstrate placement of surgical plate and fixating screws with interbody  device at C3-C4.   IMPRESSION: Intraoperative fluoroscopic assistance provided during cervical spine surgery.     Electronically Signed   By: Jasmine Pang M.D.   On: 08/30/2019 16:48    PATIENT SURVEYS:  Modified Oswestry 18  FOTO 47 with goal of 60  SCREENING FOR RED FLAGS: Bowel or bladder incontinence: No Spinal tumors: No Cauda equina syndrome: No Compression fracture: No Abdominal aneurysm: No  COGNITION: Overall cognitive status: Within functional limits for tasks assessed     SENSATION: Light touch: Impaired   MUSCLE LENGTH: Hamstrings: Right 90+ deg; Left 90+ deg   POSTURE: rounded shoulders and forward head  PALPATION: Tenderness throughout right side lumbar region  LUMBAR ROM:   AROM eval  Flexion Fingers to floor  Extension WNL  Right lateral flexion Fingers to mid calf  Left lateral flexion Fingers to mid calf  Right rotation WNL  Left rotation WNL   (Blank rows = not tested)  LOWER EXTREMITY ROM:     All B LE ROM= WNL   LOWER EXTREMITY MMT:    MMT Right eval Left eval R 5/8 L5/8  Hip flexion 3+ 3+ 3+ 4-  Hip extension 3+ 3+    Hip abduction 3+ 3+    Hip adduction 4 4 4 4   Hip internal rotation 3+ 3+ 3+ 3+  Hip external rotation 3+ 3+ 4- 4-  Knee flexion 4 4 4 4   Knee extension 4 4 4+ 4+  Ankle dorsiflexion 4 4 4+ 4+  Ankle plantarflexion 4 4 4 4   Ankle inversion 4 4 4 4   Ankle eversion 4 4 4 4    (Blank rows = not tested)  LUMBAR SPECIAL TESTS:  Straight leg raise test: Negative, Slump test: Negative, Single leg stance test: To be tested next visit, and FABER test: Negative  FUNCTIONAL TESTS:  5 times sit to stand: 19.23 sec without UE support 6 minute walk test: To be evaluated next visit 10 meter walk test: 15.11 and 15.49 sec without an UE support = 15.3 sec or 0.65 m/s Berg Balance Scale:  To be evaluated next visit  GAIT:  Distance walked: approx 100 feet  Assistive device utilized: Single point cane Level of  assistance: SBA Comments: Genu varus- excessive Left LE; right LE decreased heel strike with decreased step height and length, Foot adducted   TODAY'S TREATMENT:             09/18/22                                                                                                                NMR:   5 # AW donned  Step up and step down with 5# AW onto 4" block, onto 6" then back to round x 10 reps each LE without UE support, working on foot clearance. No UE support   Lateral step overs 5# AW, 1/2 foam roll, 2 sets 10 reps each way without UE support  Heel to toe gait  sequencing (5#AW) heel strike on floor with toe pad landing on 1/2 foam x 25 reps each LE  Overground walking with no AD using tile color as divider to widen BOS when walking, 3 laps down and back a distance of approx 10 feet.  Ambulation ~450' using SPC, 5# AW donned with CGA. Good foot clearance displayed throughout   Doffed AW  4 laps,walking in square shape, utilizing forward and backward walking, and L and R side stepping approx 10' each direction     PATIENT EDUCATION:  Education details: Pt educated throughout session about proper posture and technique with exercises. Improved exercise technique, movement at target joints, use of target muscles after min to mod verbal, visual, tactile cues.  Person educated: Patient Education method: Explanation, Demonstration, Tactile cues, and Verbal cues Education comprehension: verbalized understanding, returned demonstration, verbal cues required, tactile cues required, and needs further education  HOME EXERCISE PROGRAM: Reviewed from last episode of care- will add to HEP as appropriate.   ASSESSMENT:  CLINICAL IMPRESSION: Patient appeared motivated and ready for treatment on this day. Today's session focused on greater LE proprioception and general LE strengthening. Continued to work on using external cues to keep BOS widened, pt responding well. Additionally  responding well to usage of ankle weights working on strength to assist with foot clearance during ambulation. Pt will continue to benefit from skilled physical therapy intervention to address impairments, improve QOL, and attain therapy goals.     OBJECTIVE IMPAIRMENTS: Abnormal gait, decreased activity tolerance, decreased balance, decreased endurance, decreased mobility, difficulty walking, decreased ROM, decreased strength, and pain.   ACTIVITY LIMITATIONS: carrying, lifting, bending, sitting, standing, squatting, stairs, transfers, and bed mobility  PARTICIPATION LIMITATIONS: cleaning, laundry, shopping, community activity, and yard work  PERSONAL FACTORS: Time since onset of injury/illness/exacerbation and 1-2 comorbidities: HTN, Arthritis, past cervical ACDF procedure in 2021  are also affecting patient's functional outcome.   REHAB POTENTIAL: Good  CLINICAL DECISION MAKING: Stable/uncomplicated  EVALUATION COMPLEXITY: Moderate   GOALS: Goals reviewed with patient? Yes  SHORT TERM GOALS: Target date: 08/26/2022  Pt. Independent with HEP to increase B LE  strength 1/2 muscle grade to improve standing/ independence with gait.  Baseline: See chart in objective, several increases but some remain the same  Goal status: IN PROGRESS  2.  Pt will decrease worst back pain as reported on NPRS by at least 2 points in order to demonstrate clinically significant reduction in back pain.   Baseline: EVAL= up to 8/10 at worst 08/14/22: 3/10 at worst  Goal status: MET   LONG TERM GOALS: Target date: 10/07/2022  Pt will improve FOTO to target score  of  60 or greater to display perceived improvements in ability to complete ADL's.  Baseline: EVAL= 47 5/8: 56 Goal status: IN PROGRESS  2.  Pt will decrease MODI score by at least 8 points in order demonstrate clinically significant reduction in back pain/disability.  Baseline: EVAL= 18 5/8: 10 Goal status: IN PROGRESS  3.  Pt will decrease  worst back pain as reported on NPRS by at least 3 points in order to demonstrate clinically significant reduction in back pain.   Baseline: EVAL= Up to 8/10 low back pain 08/14/22: 4/10 worst after work  Goal status: IN PROGRESS  4.  Pt will improve FGA by at least 3 points in order to demonstrate clinically significant improvement in balance.   Baseline: EVAL: to be initiated next visit; 07/17/2022= 18/30 08/14/22:19/30 Goal status: IN PROGRESS  5.  Pt will decrease 5TSTS by at least 3 seconds in order to demonstrate clinically significant improvement in LE strength Baseline: EVAL= 19.23 sec  08/14/22:14.38 sec Goal status: MET  6.  Pt will increase by at least 0.13 m/s in order to demonstrate clinically significant improvement in community ambulation.   Baseline: EVAL= 0.65 m/s without UE support  Goal status: MET   7. Patient will improved 6 min walk test > 150 feet for improved gait efficiency and improved overall gait endurance with community activities.    Baseline: 07/17/2022= 1050 feet with cane 5/8: 1230 ft no AD    Goal status: MET  PLAN:  PT FREQUENCY: 1-2x/week  PT DURATION: 12 weeks  PLANNED INTERVENTIONS: Therapeutic exercises, Therapeutic activity, Neuromuscular re-education, Balance training, Gait training, Patient/Family education, Self Care, Joint mobilization, Joint manipulation, Stair training, Vestibular training, Canalith repositioning, Orthotic/Fit training, DME instructions, Dry Needling, Spinal manipulation, Spinal mobilization, Cryotherapy, Moist heat, Taping, and Manual therapy.  PLAN FOR NEXT SESSION:   Continue with balance, Therex, and gait training next visit.  Reassess HEP and adjust accordingly Manual therapy for LBP as needed     Cecile Sheerer, SPT   This entire session was performed under direct supervision and direction of a licensed therapist/therapist assistant . I have personally read, edited and approve of the note as written.    This  licensed clinician was present and actively directing care throughout the session at all times.  Norman Herrlich PT ,DPT Physical Therapist- [facility]   3:38 PM 09/18/22

## 2022-09-23 ENCOUNTER — Ambulatory Visit: Payer: Medicare Other

## 2022-09-23 DIAGNOSIS — M6281 Muscle weakness (generalized): Secondary | ICD-10-CM

## 2022-09-23 DIAGNOSIS — R269 Unspecified abnormalities of gait and mobility: Secondary | ICD-10-CM | POA: Diagnosis not present

## 2022-09-23 DIAGNOSIS — G8929 Other chronic pain: Secondary | ICD-10-CM | POA: Diagnosis not present

## 2022-09-23 DIAGNOSIS — M2560 Stiffness of unspecified joint, not elsewhere classified: Secondary | ICD-10-CM | POA: Diagnosis not present

## 2022-09-23 DIAGNOSIS — R2681 Unsteadiness on feet: Secondary | ICD-10-CM

## 2022-09-23 DIAGNOSIS — R262 Difficulty in walking, not elsewhere classified: Secondary | ICD-10-CM

## 2022-09-23 DIAGNOSIS — M545 Low back pain, unspecified: Secondary | ICD-10-CM | POA: Diagnosis not present

## 2022-09-23 DIAGNOSIS — R278 Other lack of coordination: Secondary | ICD-10-CM | POA: Diagnosis not present

## 2022-09-23 NOTE — Therapy (Signed)
OUTPATIENT PHYSICAL THERAPY THORACOLUMBAR VISIT /Physical Therapy Progress Note   Dates of reporting period  08/14/22   to 09/23/22      Patient Name: Larry Blair MRN: [id number] DOB:02-27-1956, 67 y.o., male Today's Date: 09/23/2022  END OF SESSION:  PT End of Session - 09/23/22 1512     Visit Number 20    Number of Visits 24    Date for PT Re-Evaluation 10/07/22    Progress Note Due on Visit 20    PT Start Time 1515    PT Stop Time 1559    PT Time Calculation (min) 44 min    Equipment Utilized During Treatment Gait belt    Activity Tolerance Patient tolerated treatment well;No increased pain    Behavior During Therapy WFL for tasks assessed/performed                      Past Medical History:  Diagnosis Date   Allergy    Arthritis    right shoulder   Asthma    Eczema    HTN (hypertension)    Hyperlipidemia    Substance abuse (HCC) 1976   daily   Past Surgical History:  Procedure Laterality Date   ANTERIOR CERVICAL DECOMP/DISCECTOMY FUSION N/A 08/30/2019   Procedure: CERVICAL THREE-FOUR ANTERIOR CERVICAL DECOMPRESSION/FUSION;  Surgeon: Tia Alert, MD;  Location: [facility] OR;  Service: Neurosurgery;  Laterality: N/A;   COLONOSCOPY     KNEE ARTHROSCOPY Left    Patient Active Problem List   Diagnosis Date Noted   Encounter for general adult medical examination with abnormal findings 07/04/2022   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 Sanda Linger  REFERRING PROVIDER: Dr. Sanda Linger  REFERRING DIAG:  Chronic bilateral Low back pain without Sciatica  Rationale for Evaluation and Treatment: Rehabilitation  THERAPY DIAG:  Muscle weakness (generalized)  Unsteadiness on feet  Difficulty in walking, not elsewhere classified  Abnormality of gait and mobility  ONSET DATE: 08/27/2022  SUBJECTIVE:                                                                                                                                                                                           SUBJECTIVE STATEMENT: Patient  reports he feels he is getting better every week. Is having less pain.     PERTINENT HISTORY:  Fall with neck surgery - started it all - damaged vertebrae in my neck- 2021.- s/p ACDF procedure, remote knee scope.   PAIN:  Are you having pain? No  PRECAUTIONS: Fall  WEIGHT BEARING RESTRICTIONS: No  FALLS:  Has patient fallen in last 6 months? 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   NEXT MD VISIT: No follow up  OBJECTIVE:   DIAGNOSTIC FINDINGS:  CLINICAL DATA:  C-spine fusion   EXAM: CERVICAL SPINE - 2-3 VIEW; DG C-ARM 1-60 MIN   COMPARISON:  MRI 08/26/2019   FINDINGS: Three low resolution intraoperative spot views of the cervical spine. Total fluoroscopy time was 7 seconds.   Initial image demonstrates localizing instrument anterior to C4. Subsequent images demonstrate placement of surgical plate and fixating screws with interbody device at C3-C4.   IMPRESSION: Intraoperative fluoroscopic assistance provided during cervical spine surgery.     Electronically Signed   By: Jasmine Pang M.D.   On: 08/30/2019 16:48    PATIENT SURVEYS:  Modified Oswestry 18  FOTO 47 with goal of 60  SCREENING FOR RED FLAGS: Bowel or bladder incontinence: No Spinal tumors: No Cauda equina syndrome: No Compression fracture: No Abdominal aneurysm: No  COGNITION: Overall cognitive status: Within functional limits for tasks assessed     SENSATION: Light touch: Impaired   MUSCLE LENGTH: Hamstrings: Right 90+ deg; Left 90+ deg   POSTURE: rounded shoulders and forward head  PALPATION: Tenderness throughout right side lumbar region  LUMBAR ROM:   AROM eval  Flexion Fingers to floor  Extension WNL  Right lateral flexion Fingers to mid calf  Left lateral flexion Fingers to mid calf  Right rotation WNL  Left rotation WNL   (Blank rows = not tested)  LOWER EXTREMITY ROM:     All B LE ROM= WNL   LOWER EXTREMITY MMT:    MMT Right eval Left eval R 5/8 L5/8  Hip flexion 3+ 3+ 3+ 4-  Hip extension 3+ 3+    Hip abduction 3+ 3+    Hip adduction 4 4 4 4   Hip internal rotation 3+ 3+ 3+ 3+  Hip external rotation 3+ 3+ 4- 4-  Knee flexion 4 4 4 4   Knee extension 4 4 4+ 4+  Ankle dorsiflexion 4 4 4+ 4+  Ankle plantarflexion 4 4 4 4   Ankle inversion 4 4 4 4   Ankle eversion 4 4 4 4    (Blank rows = not tested)  LUMBAR SPECIAL TESTS:  Straight leg raise test: Negative, Slump test: Negative, Single leg stance test: To be tested next visit, and FABER test: Negative  FUNCTIONAL TESTS:  5 times sit to stand: 19.23 sec without UE support 6 minute walk test: To be evaluated next visit 10 meter walk test: 15.11 and 15.49 sec without an UE support = 15.3 sec or 0.65 m/s Berg Balance Scale: To be evaluated next visit  GAIT:  Distance walked: approx 100 feet  Assistive device utilized: Single point cane Level of assistance: SBA Comments: Genu varus-  excessive Left LE; right LE decreased heel strike with decreased step height and length, Foot adducted   TODAY'S  TREATMENT:             09/23/22                                                                                                                Goals: see below for details  TREATMENT   Lateral step overs 4# AW, 1/2 foam roll, 20x with no UE support Forward heel toe step with #4 AW half foam roller SUE support 10x each LE  Seated:  LAQ with #4 ankle weights: hold 3 second  March with #4 ankle weight with cue for core activation   Airex pad tandem stance with 6" step: dual task word game x 4 minutes with reaching and visual scan      PATIENT EDUCATION:  Education details: Pt educated throughout session about proper posture and technique with exercises. Improved exercise technique, movement at target joints, use of target muscles after min to mod verbal, visual, tactile cues.  Person educated: Patient Education method: Explanation, Demonstration, Tactile cues, and Verbal cues Education comprehension: verbalized understanding, returned demonstration, verbal cues required, tactile cues required, and needs further education  HOME EXERCISE PROGRAM: Reviewed from last episode of care- will add to HEP as appropriate.   ASSESSMENT:  CLINICAL IMPRESSION: Patient is making progress towards functional stability and mobility goals. He has decreased back pain and improved stability at this time as can be seen in below goals.  Patient's condition has the potential to improve in response to therapy. Maximum improvement is yet to be obtained. The anticipated improvement is attainable and reasonable in a generally predictable time.Pt will continue to benefit from skilled physical therapy intervention to address impairments, improve QOL, and attain therapy goals.     OBJECTIVE IMPAIRMENTS: Abnormal gait, decreased activity tolerance, decreased balance, decreased endurance, decreased mobility, difficulty walking, decreased ROM, decreased strength, and pain.   ACTIVITY LIMITATIONS: carrying,  lifting, bending, sitting, standing, squatting, stairs, transfers, and bed mobility  PARTICIPATION LIMITATIONS: cleaning, laundry, shopping, community activity, and yard work  PERSONAL FACTORS: Time since onset of injury/illness/exacerbation and 1-2 comorbidities: HTN, Arthritis, past cervical ACDF procedure in 2021  are also affecting patient's functional outcome.   REHAB POTENTIAL: Good  CLINICAL DECISION MAKING: Stable/uncomplicated  EVALUATION COMPLEXITY: Moderate   GOALS: Goals reviewed with patient? Yes  SHORT TERM GOALS: Target date: 08/26/2022  Pt. Independent with HEP to increase B LE strength 1/2 muscle grade to improve standing/ independence with gait.  Baseline: See chart in objective, several increases but some remain the same  Goal status: IN PROGRESS  2.  Pt will decrease worst back pain as reported on NPRS by at least 2 points in order to demonstrate clinically significant reduction in back pain.   Baseline: EVAL= up to 8/10 at worst 08/14/22: 3/10 at worst  Goal status: MET   LONG TERM GOALS: Target date: 10/07/2022  Pt will improve FOTO to target score  of  60 or greater to display perceived improvements in ability to complete ADL's.  Baseline: EVAL= 47 5/8: 56 6/17: 56%  Goal status: IN PROGRESS  2.  Pt will decrease MODI score by at least 8 points in order demonstrate clinically significant reduction in back pain/disability.  Baseline: EVAL= 18 5/8: 10 6/17: 9/50  Goal status: MET  3.  Pt will decrease worst back pain as reported on NPRS by at least 3 points in order to demonstrate clinically significant reduction in back pain.   Baseline: EVAL= Up to 8/10 low back pain 08/14/22: 4/10 worst after work 6/1: 4/10  Goal status: IN PROGRESS  4.  Pt will improve FGA by at least 3 points in order to demonstrate clinically significant improvement in balance.   Baseline: EVAL: to be initiated next visit; 07/17/2022= 18/30 08/14/22:19/30 6/17:  2030 Goal status: IN  PROGRESS  5.  Pt will decrease 5TSTS by at least 3 seconds in order to demonstrate clinically significant improvement in LE strength Baseline: EVAL= 19.23 sec  08/14/22:14.38 sec Goal status: MET  6.  Pt will increase by at least 0.13 m/s in order to demonstrate clinically significant improvement in community ambulation.   Baseline: EVAL= 0.65 m/s without UE support  Goal status: MET   7. Patient will improved 6 min walk test > 150 feet for improved gait efficiency and improved overall gait endurance with community activities.    Baseline: 07/17/2022= 1050 feet with cane 5/8: 1230 ft no AD    Goal status: MET  PLAN:  PT FREQUENCY: 1-2x/week  PT DURATION: 12 weeks  PLANNED INTERVENTIONS: Therapeutic exercises, Therapeutic activity, Neuromuscular re-education, Balance training, Gait training, Patient/Family education, Self Care, Joint mobilization, Joint manipulation, Stair training, Vestibular training, Canalith repositioning, Orthotic/Fit training, DME instructions, Dry Needling, Spinal manipulation, Spinal mobilization, Cryotherapy, Moist heat, Taping, and Manual therapy.  PLAN FOR NEXT SESSION:   Continue with balance, Therex, and gait training next visit.  Reassess HEP and adjust accordingly Manual therapy for LBP as needed     Precious Bard PT ,DPT Physical Therapist- [facility]   5:09 PM 09/23/22

## 2022-09-25 ENCOUNTER — Encounter: Payer: Self-pay | Admitting: Physical Therapy

## 2022-09-25 ENCOUNTER — Ambulatory Visit: Payer: Medicare Other

## 2022-09-25 DIAGNOSIS — R269 Unspecified abnormalities of gait and mobility: Secondary | ICD-10-CM | POA: Diagnosis not present

## 2022-09-25 DIAGNOSIS — R2681 Unsteadiness on feet: Secondary | ICD-10-CM | POA: Diagnosis not present

## 2022-09-25 DIAGNOSIS — R278 Other lack of coordination: Secondary | ICD-10-CM | POA: Diagnosis not present

## 2022-09-25 DIAGNOSIS — M6281 Muscle weakness (generalized): Secondary | ICD-10-CM | POA: Diagnosis not present

## 2022-09-25 DIAGNOSIS — G8929 Other chronic pain: Secondary | ICD-10-CM

## 2022-09-25 DIAGNOSIS — R262 Difficulty in walking, not elsewhere classified: Secondary | ICD-10-CM

## 2022-09-25 DIAGNOSIS — M545 Low back pain, unspecified: Secondary | ICD-10-CM | POA: Diagnosis not present

## 2022-09-25 DIAGNOSIS — M2560 Stiffness of unspecified joint, not elsewhere classified: Secondary | ICD-10-CM | POA: Diagnosis not present

## 2022-09-25 NOTE — Therapy (Signed)
OUTPATIENT PHYSICAL THERAPY THORACOLUMBAR TREATMENT   Patient Name: Larry Blair MRN: [id number] DOB:06/05/1955, 67 y.o., male Today's Date: 09/25/2022  END OF SESSION:  PT End of Session - 09/25/22 1515     Visit Number 21    Number of Visits 24    Date for PT Re-Evaluation 10/07/22    Progress Note Due on Visit 20    PT Start Time 1516    PT Stop Time 1559    PT Time Calculation (min) 43 min    Equipment Utilized During Treatment Gait belt    Activity Tolerance Patient tolerated treatment well;No increased pain    Behavior During Therapy WFL for tasks assessed/performed               Past Medical History:  Diagnosis Date   Allergy    Arthritis    right shoulder   Asthma    Eczema    HTN (hypertension)    Hyperlipidemia    Substance abuse (HCC) 1976   daily   Past Surgical History:  Procedure Laterality Date   ANTERIOR CERVICAL DECOMP/DISCECTOMY FUSION N/A 08/30/2019   Procedure: CERVICAL THREE-FOUR ANTERIOR CERVICAL DECOMPRESSION/FUSION;  Surgeon: Tia Alert, MD;  Location: [facility] OR;  Service: Neurosurgery;  Laterality: N/A;   COLONOSCOPY     KNEE ARTHROSCOPY Left    Patient Active Problem List   Diagnosis Date Noted   Encounter for general adult medical examination with abnormal findings 07/04/2022   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 Sanda Linger  REFERRING PROVIDER: Dr. Sanda Linger  REFERRING DIAG: Chronic bilateral Low back pain without Sciatica  Rationale for Evaluation and Treatment:  Rehabilitation  THERAPY DIAG:  Muscle weakness (generalized)  Unsteadiness on feet  Difficulty in walking, not elsewhere classified  Chronic bilateral low back pain without sciatica  ONSET DATE: 08/27/2022  SUBJECTIVE:                                                                                                                                                                                           SUBJECTIVE STATEMENT:  Patient reports increased pain in back after being at work and leaning over a car hood and tugging/straining to pull apart spark plug cords.  Reports he went home and applied heat and took Tylenol which seemed to help.      PERTINENT HISTORY:  Fall with neck surgery - started it all - damaged vertebrae in my neck- 2021.- s/p ACDF procedure, remote knee scope.   PAIN:  Are you having pain? No  PRECAUTIONS: Fall  WEIGHT BEARING RESTRICTIONS: No  FALLS:  Has patient fallen in last 6 months? 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   NEXT MD VISIT: No follow up  OBJECTIVE:   DIAGNOSTIC FINDINGS:  CLINICAL DATA:  C-spine fusion   EXAM: CERVICAL SPINE - 2-3 VIEW; DG C-ARM 1-60 MIN   COMPARISON:  MRI 08/26/2019   FINDINGS: Three low resolution intraoperative spot views of the cervical spine. Total fluoroscopy time was 7 seconds.   Initial image demonstrates localizing instrument anterior to C4. Subsequent images demonstrate placement of surgical plate and fixating screws with interbody device at C3-C4.   IMPRESSION: Intraoperative fluoroscopic assistance provided during  cervical spine surgery.     Electronically Signed   By: Jasmine Pang M.D.   On: 08/30/2019 16:48    PATIENT SURVEYS:  Modified Oswestry 18  FOTO 47 with goal of 60  SCREENING FOR RED FLAGS: Bowel or bladder incontinence: No Spinal tumors: No Cauda equina syndrome: No Compression fracture: No Abdominal aneurysm: No  COGNITION: Overall cognitive status: Within functional limits for tasks assessed     SENSATION: Light touch: Impaired   MUSCLE LENGTH: Hamstrings: Right 90+ deg; Left 90+ deg   POSTURE: rounded shoulders and forward head  PALPATION: Tenderness throughout right side lumbar region  LUMBAR ROM:   AROM eval  Flexion Fingers to floor  Extension WNL  Right lateral flexion Fingers to mid calf  Left lateral flexion Fingers to mid calf  Right rotation WNL  Left rotation WNL   (Blank rows = not tested)  LOWER EXTREMITY ROM:     All B LE ROM= WNL   LOWER EXTREMITY MMT:    MMT Right eval Left eval R 5/8 L5/8  Hip flexion 3+ 3+ 3+ 4-  Hip extension 3+ 3+    Hip abduction 3+ 3+    Hip adduction 4 4 4 4   Hip internal rotation 3+ 3+ 3+ 3+  Hip external rotation 3+ 3+ 4- 4-  Knee flexion 4 4 4 4   Knee extension 4 4 4+ 4+  Ankle dorsiflexion 4 4 4+ 4+  Ankle plantarflexion 4 4 4 4   Ankle inversion 4 4 4 4   Ankle eversion 4 4 4 4    (Blank rows = not tested)  LUMBAR SPECIAL TESTS:  Straight leg raise test: Negative, Slump test: Negative, Single leg stance test: To be tested next visit, and FABER test: Negative  FUNCTIONAL TESTS:  5 times sit to stand: 19.23 sec without UE support 6 minute walk test: To be evaluated next visit 10 meter walk test: 15.11 and 15.49 sec without an UE support = 15.3 sec or 0.65 m/s Berg Balance Scale: To be evaluated next visit  GAIT:  Distance walked: approx 100 feet  Assistive device utilized: Single point cane Level of assistance: SBA Comments:  Genu varus- excessive Left LE; right LE decreased heel strike with  decreased step height and length, Foot adducted   TODAY'S TREATMENT:             09/25/22                                                                                                                 TREATMENT   Supine trunk rotation 5 x 30 second hold  Supine knee to chest 3 x 30 seconds  Supine TrA activation with red physioball 2 x 10  Supine with red physioball TrA activation with arm lift overhead 2 x 10 each UE Supine feet on red physioball bridging/knee to chest/bridging+SLR 2 x 10 Standing paloff press with BTB 2 x 15 each side    PATIENT EDUCATION:  Education details: Pt educated throughout session about proper posture and technique with exercises. Improved exercise technique, movement at target joints, use of target muscles after min to mod verbal, visual, tactile cues.  Person educated: Patient Education method: Explanation, Demonstration, Tactile cues, and Verbal cues Education comprehension: verbalized understanding, returned demonstration, verbal cues required, tactile cues required, and needs further education  HOME EXERCISE PROGRAM: Reviewed from last episode of care- will add to HEP as appropriate.   ASSESSMENT:  CLINICAL IMPRESSION:  Patient arrives to therapy session motivated and eager to participate. Complaining of LBP on arrival. Session focused on LE/trunk stretching and core strengthening to improve back pain and focus on TrA activation. Educated patient on core activation with lifting and other job specific tasks, patient verbalized understanding. Patient will continue to benefit from skilled therapy to address remaining deficits in order to improve quality of life and return to PLOF.    OBJECTIVE IMPAIRMENTS: Abnormal gait, decreased activity tolerance, decreased balance, decreased endurance, decreased mobility, difficulty walking, decreased ROM, decreased strength, and pain.   ACTIVITY LIMITATIONS: carrying, lifting, bending, sitting, standing, squatting,  stairs, transfers, and bed mobility  PARTICIPATION LIMITATIONS: cleaning, laundry, shopping, community activity, and yard work  PERSONAL FACTORS: Time since onset of injury/illness/exacerbation and 1-2 comorbidities: HTN, Arthritis, past cervical ACDF procedure in 2021  are also affecting patient's functional outcome.   REHAB POTENTIAL: Good  CLINICAL DECISION MAKING: Stable/uncomplicated  EVALUATION COMPLEXITY: Moderate   GOALS: Goals reviewed with patient? Yes  SHORT TERM GOALS: Target date: 08/26/2022  Pt. Independent with HEP to increase B LE strength 1/2 muscle grade to improve standing/ independence with gait.  Baseline: See chart in objective, several increases but some remain the same  Goal status: IN PROGRESS  2.  Pt will decrease worst back pain as reported on NPRS by at least 2 points in order to demonstrate clinically significant reduction in back pain.   Baseline: EVAL= up to 8/10 at worst 08/14/22: 3/10 at worst  Goal status: MET   LONG TERM GOALS: Target date: 10/07/2022  Pt will improve FOTO to target score  of  60 or greater to display perceived improvements in ability to complete ADL's.  Baseline: EVAL= 47 5/8: 56  6/17: 56%  Goal status: IN PROGRESS  2.  Pt will decrease MODI score by at least 8 points in order demonstrate clinically significant reduction in back pain/disability.  Baseline: EVAL= 18 5/8: 10 6/17: 9/50  Goal status: MET  3.  Pt will decrease worst back pain as reported on NPRS by at least 3 points in order to demonstrate clinically significant reduction in back pain.   Baseline: EVAL= Up to 8/10 low back pain 08/14/22: 4/10 worst after work 6/1: 4/10  Goal status: IN PROGRESS  4.  Pt will improve FGA by at least 3 points in order to demonstrate clinically significant improvement in balance.   Baseline: EVAL: to be initiated next visit; 07/17/2022= 18/30 08/14/22:19/30 6/17:  2030 Goal status: IN PROGRESS  5.  Pt will decrease 5TSTS by at least 3  seconds in order to demonstrate clinically significant improvement in LE strength Baseline: EVAL= 19.23 sec  08/14/22:14.38 sec Goal status: MET  6.  Pt will increase by at least 0.13 m/s in order to demonstrate clinically significant improvement in community ambulation.   Baseline: EVAL= 0.65 m/s without UE support  Goal status: MET   7. Patient will improved 6 min walk test > 150 feet for improved gait efficiency and improved overall gait endurance with community activities.    Baseline: 07/17/2022= 1050 feet with cane 5/8: 1230 ft no AD    Goal status: MET  PLAN:  PT FREQUENCY: 1-2x/week  PT DURATION: 12 weeks  PLANNED INTERVENTIONS: Therapeutic exercises, Therapeutic activity, Neuromuscular re-education, Balance training, Gait training, Patient/Family education, Self Care, Joint mobilization, Joint manipulation, Stair training, Vestibular training, Canalith repositioning, Orthotic/Fit training, DME instructions, Dry Needling, Spinal manipulation, Spinal mobilization, Cryotherapy, Moist heat, Taping, and Manual therapy.  PLAN FOR NEXT SESSION:   Continue with balance, Therex, and gait training next visit.  Reassess HEP and adjust accordingly Manual therapy for LBP as needed     Maylon Peppers PT ,DPT Physical Therapist- [facility]  [facility]   4:00 PM 09/25/22

## 2022-09-29 ENCOUNTER — Other Ambulatory Visit: Payer: Self-pay | Admitting: Internal Medicine

## 2022-09-29 DIAGNOSIS — L309 Dermatitis, unspecified: Secondary | ICD-10-CM

## 2022-09-30 ENCOUNTER — Ambulatory Visit: Payer: Medicare Other

## 2022-09-30 DIAGNOSIS — R269 Unspecified abnormalities of gait and mobility: Secondary | ICD-10-CM

## 2022-09-30 DIAGNOSIS — M545 Low back pain, unspecified: Secondary | ICD-10-CM | POA: Diagnosis not present

## 2022-09-30 DIAGNOSIS — R262 Difficulty in walking, not elsewhere classified: Secondary | ICD-10-CM | POA: Diagnosis not present

## 2022-09-30 DIAGNOSIS — M2560 Stiffness of unspecified joint, not elsewhere classified: Secondary | ICD-10-CM | POA: Diagnosis not present

## 2022-09-30 DIAGNOSIS — G8929 Other chronic pain: Secondary | ICD-10-CM

## 2022-09-30 DIAGNOSIS — M6281 Muscle weakness (generalized): Secondary | ICD-10-CM | POA: Diagnosis not present

## 2022-09-30 DIAGNOSIS — R278 Other lack of coordination: Secondary | ICD-10-CM | POA: Diagnosis not present

## 2022-09-30 DIAGNOSIS — R2681 Unsteadiness on feet: Secondary | ICD-10-CM

## 2022-09-30 NOTE — Therapy (Signed)
OUTPATIENT PHYSICAL THERAPY THORACOLUMBAR TREATMENT   Patient Name: Larry Blair MRN: [id number] DOB:[birth year], 67 y.o., male Today's Date: 10/01/2022  END OF SESSION:  PT End of Session - 09/30/22 1523     Visit Number 22    Number of Visits 24    Date for PT Re-Evaluation 10/07/22    Progress Note Due on Visit 20    PT Start Time 1515    PT Stop Time 1559    PT Time Calculation (min) 44 min    Equipment Utilized During Treatment Gait belt    Activity Tolerance Patient tolerated treatment well;No increased pain    Behavior During Therapy WFL for tasks assessed/performed                Past Medical History:  Diagnosis Date   Allergy    Arthritis    right shoulder   Asthma    Eczema    HTN (hypertension)    Hyperlipidemia    Substance abuse (HCC) 1976   daily   Past Surgical History:  Procedure Laterality Date   ANTERIOR CERVICAL DECOMP/DISCECTOMY FUSION N/A 08/30/2019   Procedure: CERVICAL THREE-FOUR ANTERIOR CERVICAL DECOMPRESSION/FUSION;  Surgeon: Tia Alert, MD;  Location: [facility] OR;  Service: Neurosurgery;  Laterality: N/A;   COLONOSCOPY     KNEE ARTHROSCOPY Left    Patient Active Problem List   Diagnosis Date Noted   Encounter for general adult medical examination with abnormal findings 07/04/2022   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 Sanda Linger  REFERRING PROVIDER: Dr. 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  States he feels like he is moving around better.       PERTINENT HISTORY:  Fall with neck surgery - started it all - damaged vertebrae in my neck- 2021.- s/p ACDF procedure, remote knee scope.   PAIN:  Are you having pain? No  PRECAUTIONS: Fall  WEIGHT BEARING RESTRICTIONS: No  FALLS:  Has patient fallen in last 6 months? 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   NEXT MD VISIT: No follow up  OBJECTIVE:   DIAGNOSTIC FINDINGS:  CLINICAL DATA:  C-spine fusion   EXAM: CERVICAL SPINE - 2-3 VIEW; DG C-ARM 1-60 MIN   COMPARISON:  MRI 08/26/2019   FINDINGS: Three low resolution intraoperative spot views of the cervical spine. Total fluoroscopy time was 7 seconds.   Initial image demonstrates localizing instrument anterior to C4. Subsequent images demonstrate placement of surgical plate and fixating screws with interbody device at C3-C4.   IMPRESSION: Intraoperative fluoroscopic assistance provided during cervical spine surgery.      Electronically Signed   By: Jasmine Pang M.D.   On: 08/30/2019 16:48    PATIENT SURVEYS:  Modified Oswestry 18  FOTO 47 with goal of 60  SCREENING FOR RED FLAGS: Bowel or bladder incontinence: No Spinal tumors: No Cauda equina syndrome: No Compression fracture: No Abdominal aneurysm: No  COGNITION: Overall cognitive status: Within functional limits for tasks assessed     SENSATION: Light touch: Impaired   MUSCLE LENGTH: Hamstrings: Right 90+ deg; Left 90+ deg   POSTURE: rounded shoulders and forward head  PALPATION: Tenderness throughout right side lumbar region  LUMBAR ROM:   AROM eval  Flexion Fingers to floor  Extension WNL  Right lateral flexion Fingers to mid calf  Left lateral flexion Fingers to mid calf  Right rotation WNL  Left rotation WNL   (Blank rows = not tested)  LOWER EXTREMITY ROM:     All B LE ROM= WNL   LOWER EXTREMITY MMT:    MMT Right eval Left eval R 5/8 L5/8  Hip flexion 3+ 3+ 3+ 4-  Hip extension 3+ 3+    Hip abduction 3+ 3+    Hip adduction 4 4 4 4   Hip internal rotation 3+ 3+ 3+ 3+  Hip external rotation 3+ 3+ 4- 4-  Knee flexion 4 4 4 4   Knee extension 4 4 4+ 4+  Ankle dorsiflexion 4 4 4+ 4+  Ankle plantarflexion 4 4 4 4   Ankle inversion 4 4 4 4   Ankle eversion 4 4 4 4    (Blank rows = not tested)  LUMBAR SPECIAL TESTS:  Straight leg raise test: Negative, Slump test: Negative, Single leg stance test: To be tested next visit, and FABER test: Negative  FUNCTIONAL TESTS:  5 times sit to stand: 19.23 sec without UE support 6 minute walk test: To be evaluated next visit 10 meter walk test: 15.11 and 15.49 sec without an UE support = 15.3 sec or 0.65 m/s Berg Balance Scale: To be evaluated next visit  GAIT:  Distance walked: approx 100 feet  Assistive device utilized: Single point cane Level of assistance: SBA Comments: Genu varus- excessive Left  LE; right LE decreased heel strike with decreased step height and  length, Foot adducted   TODAY'S TREATMENT:             10/01/22                                                                                                                 TREATMENT   Nustep- interval LE strengthening from L1-L5  for 6 min with VC to keep SPM around 50-60 and patient denied any pain or difficulty today.   Gait in clinic with hurrycane- 5# AW x 300 feet   NMR:  The following below activities performed in // bars with CGA :    High knee march with step up onto 6" step- no UE support x 20 reps each LE- some unsteadiness but able to complete with VC to perform slowly.   Forward/ backward step up/over 1/2 foam x 20 reps without UE Support.   Wide step walking in // bars with 2x4 board on tape line to remind patient to keep right LE wide and not cross over- down and retro gait back x 6 with 5 #  Diagonal steps- forward/reverse up/over 2x4 board - down and back in // bars x 4 with 5#     PATIENT EDUCATION:  Education details: Pt educated throughout session about proper posture and technique with exercises. Improved exercise technique, movement at target joints, use of target muscles after min to mod verbal, visual, tactile cues.  Person educated: Patient Education method: Explanation, Demonstration, Tactile cues, and Verbal cues Education comprehension: verbalized understanding, returned demonstration, verbal cues required, tactile cues required, and needs further education  HOME EXERCISE PROGRAM: Reviewed from last episode of care- will add to HEP as appropriate.   ASSESSMENT:  CLINICAL IMPRESSION:  Patient performed well with all activities today- demonstrating improving dynamic balance as seen by improved SLS with diagonal/forward/backward stepping. Also able to keep right foot in anterior position without inverting in as much with balance and later gait in clinic. He remains well motivated able to complete all activities safely with minimal VC. Patient will  continue to benefit from skilled therapy to address remaining deficits in order to improve quality of life and return to PLOF.    OBJECTIVE IMPAIRMENTS: Abnormal gait, decreased activity tolerance, decreased balance, decreased endurance, decreased mobility, difficulty walking, decreased ROM, decreased strength, and pain.   ACTIVITY LIMITATIONS: carrying, lifting, bending, sitting, standing, squatting, stairs, transfers, and bed mobility  PARTICIPATION LIMITATIONS: cleaning, laundry, shopping, community activity, and yard work  PERSONAL FACTORS: Time since onset of injury/illness/exacerbation and 1-2 comorbidities: HTN, Arthritis, past cervical ACDF procedure in 2021  are also affecting patient's functional outcome.   REHAB POTENTIAL: Good  CLINICAL DECISION MAKING: Stable/uncomplicated  EVALUATION COMPLEXITY: Moderate   GOALS: Goals reviewed with patient? Yes  SHORT TERM GOALS: Target date: 08/26/2022  Pt. Independent with HEP to increase B LE strength 1/2 muscle grade to improve standing/ independence with gait.  Baseline: See chart in objective, several increases but some  remain the same  Goal status: IN PROGRESS  2.  Pt will decrease worst back pain as reported on NPRS by at least 2 points in order to demonstrate clinically significant reduction in back pain.   Baseline: EVAL= up to 8/10 at worst 08/14/22: 3/10 at worst  Goal status: MET   LONG TERM GOALS: Target date: 10/07/2022  Pt will improve FOTO to target score  of  60 or greater to display perceived improvements in ability to complete ADL's.  Baseline: EVAL= 47 5/8: 56 6/17: 56%  Goal status: IN PROGRESS  2.  Pt will decrease MODI score by at least 8 points in order demonstrate clinically significant reduction in back pain/disability.  Baseline: EVAL= 18 5/8: 10 6/17: 9/50  Goal status: MET  3.  Pt will decrease worst back pain as reported on NPRS by at least 3 points in order to demonstrate clinically significant  reduction in back pain.   Baseline: EVAL= Up to 8/10 low back pain 08/14/22: 4/10 worst after work 6/1: 4/10  Goal status: IN PROGRESS  4.  Pt will improve FGA by at least 3 points in order to demonstrate clinically significant improvement in balance.   Baseline: EVAL: to be initiated next visit; 07/17/2022= 18/30 08/14/22:19/30 6/17:  2030 Goal status: IN PROGRESS  5.  Pt will decrease 5TSTS by at least 3 seconds in order to demonstrate clinically significant improvement in LE strength Baseline: EVAL= 19.23 sec  08/14/22:14.38 sec Goal status: MET  6.  Pt will increase by at least 0.13 m/s in order to demonstrate clinically significant improvement in community ambulation.   Baseline: EVAL= 0.65 m/s without UE support  Goal status: MET   7. Patient will improved 6 min walk test > 150 feet for improved gait efficiency and improved overall gait endurance with community activities.    Baseline: 07/17/2022= 1050 feet with cane 5/8: 1230 ft no AD    Goal status: MET  PLAN:  PT FREQUENCY: 1-2x/week  PT DURATION: 12 weeks  PLANNED INTERVENTIONS: Therapeutic exercises, Therapeutic activity, Neuromuscular re-education, Balance training, Gait training, Patient/Family education, Self Care, Joint mobilization, Joint manipulation, Stair training, Vestibular training, Canalith repositioning, Orthotic/Fit training, DME instructions, Dry Needling, Spinal manipulation, Spinal mobilization, Cryotherapy, Moist heat, Taping, and Manual therapy.  PLAN FOR NEXT SESSION:   Continue with balance, Therex, and gait training next visit.  Reassess HEP and adjust accordingly Manual therapy for LBP as needed     Louis Meckel, PT Physical Therapist- [facility]  [facility]   9:17 AM 10/01/22

## 2022-10-02 ENCOUNTER — Ambulatory Visit: Payer: Medicare Other | Admitting: Physical Therapy

## 2022-10-02 DIAGNOSIS — R269 Unspecified abnormalities of gait and mobility: Secondary | ICD-10-CM

## 2022-10-02 DIAGNOSIS — G8929 Other chronic pain: Secondary | ICD-10-CM

## 2022-10-02 DIAGNOSIS — R278 Other lack of coordination: Secondary | ICD-10-CM

## 2022-10-02 DIAGNOSIS — M2560 Stiffness of unspecified joint, not elsewhere classified: Secondary | ICD-10-CM

## 2022-10-02 DIAGNOSIS — M6281 Muscle weakness (generalized): Secondary | ICD-10-CM

## 2022-10-02 DIAGNOSIS — M545 Low back pain, unspecified: Secondary | ICD-10-CM | POA: Diagnosis not present

## 2022-10-02 DIAGNOSIS — R262 Difficulty in walking, not elsewhere classified: Secondary | ICD-10-CM | POA: Diagnosis not present

## 2022-10-02 DIAGNOSIS — R2681 Unsteadiness on feet: Secondary | ICD-10-CM

## 2022-10-02 NOTE — Therapy (Signed)
OUTPATIENT PHYSICAL THERAPY THORACOLUMBAR TREATMENT   Patient Name: Larry Blair MRN: [id number] DOB:[birth year], 67 y.o., male Today's Date: 10/01/2022  END OF SESSION:  PT End of Session - 09/30/22 1523     Visit Number 22    Number of Visits 24    Date for PT Re-Evaluation 10/07/22    Progress Note Due on Visit 20    PT Start Time 1515    PT Stop Time 1559    PT Time Calculation (min) 44 min    Equipment Utilized During Treatment Gait belt    Activity Tolerance Patient tolerated treatment well;No increased pain    Behavior During Therapy WFL for tasks assessed/performed                Past Medical History:  Diagnosis Date   Allergy    Arthritis    right shoulder   Asthma    Eczema    HTN (hypertension)    Hyperlipidemia    Substance abuse (HCC) 1976   daily   Past Surgical History:  Procedure Laterality Date   ANTERIOR CERVICAL DECOMP/DISCECTOMY FUSION N/A 08/30/2019   Procedure: CERVICAL THREE-FOUR ANTERIOR CERVICAL DECOMPRESSION/FUSION;  Surgeon: Tia Alert, MD;  Location: [facility] OR;  Service: Neurosurgery;  Laterality: N/A;   COLONOSCOPY     KNEE ARTHROSCOPY Left    Patient Active Problem List   Diagnosis Date Noted   Encounter for general adult medical examination with abnormal findings 07/04/2022   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 Sanda Linger  REFERRING PROVIDER: Dr. 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  States he feels like he is moving around better.       PERTINENT HISTORY:  Fall with neck surgery - started it all - damaged vertebrae in my neck- 2021.- s/p ACDF procedure, remote knee scope.   PAIN:  Are you having pain? No  PRECAUTIONS: Fall  WEIGHT BEARING RESTRICTIONS: No  FALLS:  Has patient fallen in last 6 months? 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   NEXT MD VISIT: No follow up  OBJECTIVE:   DIAGNOSTIC FINDINGS:  CLINICAL DATA:  C-spine fusion   EXAM: CERVICAL SPINE - 2-3 VIEW; DG C-ARM 1-60 MIN   COMPARISON:  MRI 08/26/2019   FINDINGS: Three low resolution intraoperative spot views of the cervical spine. Total fluoroscopy time was 7 seconds.   Initial image demonstrates localizing instrument anterior to C4. Subsequent images demonstrate placement of surgical plate and fixating screws with interbody device at C3-C4.   IMPRESSION: Intraoperative fluoroscopic assistance provided during cervical spine surgery.      Electronically Signed   By: Jasmine Pang M.D.   On: 08/30/2019 16:48    PATIENT SURVEYS:  Modified Oswestry 18  FOTO 47 with goal of 60  SCREENING FOR RED FLAGS: Bowel or bladder incontinence: No Spinal tumors: No Cauda equina syndrome: No Compression fracture: No Abdominal aneurysm: No  COGNITION: Overall cognitive status: Within functional limits for tasks assessed     SENSATION: Light touch: Impaired   MUSCLE LENGTH: Hamstrings: Right 90+ deg; Left 90+ deg   POSTURE: rounded shoulders and forward head  PALPATION: Tenderness throughout right side lumbar region  LUMBAR ROM:   AROM eval  Flexion Fingers to floor  Extension WNL  Right lateral flexion Fingers to mid calf  Left lateral flexion Fingers to mid calf  Right rotation WNL  Left rotation WNL   (Blank rows = not tested)  LOWER EXTREMITY ROM:     All B LE ROM= WNL   LOWER EXTREMITY MMT:    MMT Right eval Left eval R 5/8 L5/8  Hip flexion 3+ 3+ 3+ 4-  Hip extension 3+ 3+    Hip abduction 3+ 3+    Hip adduction 4 4 4 4   Hip internal rotation 3+ 3+ 3+ 3+  Hip external rotation 3+ 3+ 4- 4-  Knee flexion 4 4 4 4   Knee extension 4 4 4+ 4+  Ankle dorsiflexion 4 4 4+ 4+  Ankle plantarflexion 4 4 4 4   Ankle inversion 4 4 4 4   Ankle eversion 4 4 4 4    (Blank rows = not tested)  LUMBAR SPECIAL TESTS:  Straight leg raise test: Negative, Slump test: Negative, Single leg stance test: To be tested next visit, and FABER test: Negative  FUNCTIONAL TESTS:  5 times sit to stand: 19.23 sec without UE support 6 minute walk test: To be evaluated next visit 10 meter walk test: 15.11 and 15.49 sec without an UE support = 15.3 sec or 0.65 m/s Berg Balance Scale: To be evaluated next visit  GAIT:  Distance walked: approx 100 feet  Assistive device utilized: Single point cane Level of assistance: SBA Comments: Genu varus- excessive Left  LE; right LE decreased heel strike with decreased step height and  length, Foot adducted   TODAY'S TREATMENT:             10/01/22                                                                                                                 TREATMENT   Standing BLE stretching Hip flexor Heel cord Hip adductor Sitting piriformis  Nustep- interval LE strengthening from L1-L5  for 6 min with VC to keep SPM around 50-60 and patient denied any pain or difficulty today.   Gait in clinic with hurrycane- 5# AW x 300 feet   NMR:  The following below activities performed in // bars with CGA :    High knee march with step up onto 6" step- no UE support x 20 reps each LE- some unsteadiness but able to complete with VC to perform slowly.   Forward/ backward step up/over 1/2 foam x 20 reps without UE Support.   Wide step walking in // bars with 2x4 board on tape line to remind patient to keep right LE wide and not cross over- down and retro gait back x 6 with 5 #  Diagonal steps- forward/reverse up/over 2x4 board - down and back in // bars x 4 with 5#     PATIENT EDUCATION:  Education details: Pt educated throughout session about proper posture and technique with exercises. Improved exercise technique, movement at target joints, use of target muscles after min to mod verbal, visual, tactile cues.  Person educated: Patient Education method: Explanation, Demonstration, Tactile cues, and Verbal cues Education comprehension: verbalized understanding, returned demonstration, verbal cues required, tactile cues required, and needs further education  HOME EXERCISE PROGRAM: Reviewed from last episode of care- will add to HEP as appropriate.   ASSESSMENT:  CLINICAL IMPRESSION:  Patient performed well with all activities today- demonstrating improving dynamic balance as seen by improved SLS with diagonal/forward/backward stepping. Also able to keep right foot in anterior position without inverting in as much with balance and later gait in clinic. He remains  well motivated able to complete all activities safely with minimal VC. Patient will continue to benefit from skilled therapy to address remaining deficits in order to improve quality of life and return to PLOF.    OBJECTIVE IMPAIRMENTS: Abnormal gait, decreased activity tolerance, decreased balance, decreased endurance, decreased mobility, difficulty walking, decreased ROM, decreased strength, and pain.   ACTIVITY LIMITATIONS: carrying, lifting, bending, sitting, standing, squatting, stairs, transfers, and bed mobility  PARTICIPATION LIMITATIONS: cleaning, laundry, shopping, community activity, and yard work  PERSONAL FACTORS: Time since onset of injury/illness/exacerbation and 1-2 comorbidities: HTN, Arthritis, past cervical ACDF procedure in 2021  are also affecting patient's functional outcome.   REHAB POTENTIAL: Good  CLINICAL DECISION MAKING: Stable/uncomplicated  EVALUATION COMPLEXITY: Moderate   GOALS: Goals reviewed with patient? Yes  SHORT TERM GOALS: Target date: 08/26/2022  Pt. Independent with HEP to increase B LE strength 1/2 muscle grade to improve standing/ independence  with gait.  Baseline: See chart in objective, several increases but some remain the same  Goal status: IN PROGRESS  2.  Pt will decrease worst back pain as reported on NPRS by at least 2 points in order to demonstrate clinically significant reduction in back pain.   Baseline: EVAL= up to 8/10 at worst 08/14/22: 3/10 at worst  Goal status: MET   LONG TERM GOALS: Target date: 10/07/2022  Pt will improve FOTO to target score  of  60 or greater to display perceived improvements in ability to complete ADL's.  Baseline: EVAL= 47 5/8: 56 6/17: 56%  Goal status: IN PROGRESS  2.  Pt will decrease MODI score by at least 8 points in order demonstrate clinically significant reduction in back pain/disability.  Baseline: EVAL= 18 5/8: 10 6/17: 9/50  Goal status: MET  3.  Pt will decrease worst back pain as  reported on NPRS by at least 3 points in order to demonstrate clinically significant reduction in back pain.   Baseline: EVAL= Up to 8/10 low back pain 08/14/22: 4/10 worst after work 6/1: 4/10  Goal status: IN PROGRESS  4.  Pt will improve FGA by at least 3 points in order to demonstrate clinically significant improvement in balance.   Baseline: EVAL: to be initiated next visit; 07/17/2022= 18/30 08/14/22:19/30 6/17:  2030 Goal status: IN PROGRESS  5.  Pt will decrease 5TSTS by at least 3 seconds in order to demonstrate clinically significant improvement in LE strength Baseline: EVAL= 19.23 sec  08/14/22:14.38 sec Goal status: MET  6.  Pt will increase by at least 0.13 m/s in order to demonstrate clinically significant improvement in community ambulation.   Baseline: EVAL= 0.65 m/s without UE support  Goal status: MET   7. Patient will improved 6 min walk test > 150 feet for improved gait efficiency and improved overall gait endurance with community activities.    Baseline: 07/17/2022= 1050 feet with cane 5/8: 1230 ft no AD    Goal status: MET  PLAN:  PT FREQUENCY: 1-2x/week  PT DURATION: 12 weeks  PLANNED INTERVENTIONS: Therapeutic exercises, Therapeutic activity, Neuromuscular re-education, Balance training, Gait training, Patient/Family education, Self Care, Joint mobilization, Joint manipulation, Stair training, Vestibular training, Canalith repositioning, Orthotic/Fit training, DME instructions, Dry Needling, Spinal manipulation, Spinal mobilization, Cryotherapy, Moist heat, Taping, and Manual therapy.  PLAN FOR NEXT SESSION:   Continue with balance, Therex, and gait training next visit.  Reassess HEP and adjust accordingly Manual therapy for LBP as needed     Grier Rocher PT, DPT  Physical Therapist - [facility]  [facility]  3:25 PM 10/02/22

## 2022-10-03 ENCOUNTER — Ambulatory Visit: Payer: Medicare Other | Admitting: Physical Therapy

## 2022-10-07 ENCOUNTER — Ambulatory Visit: Payer: Medicare Other | Attending: Internal Medicine

## 2022-10-07 DIAGNOSIS — G8929 Other chronic pain: Secondary | ICD-10-CM | POA: Diagnosis not present

## 2022-10-07 DIAGNOSIS — R278 Other lack of coordination: Secondary | ICD-10-CM | POA: Insufficient documentation

## 2022-10-07 DIAGNOSIS — M545 Low back pain, unspecified: Secondary | ICD-10-CM | POA: Insufficient documentation

## 2022-10-07 DIAGNOSIS — R262 Difficulty in walking, not elsewhere classified: Secondary | ICD-10-CM | POA: Insufficient documentation

## 2022-10-07 DIAGNOSIS — R2681 Unsteadiness on feet: Secondary | ICD-10-CM | POA: Diagnosis not present

## 2022-10-07 DIAGNOSIS — M6281 Muscle weakness (generalized): Secondary | ICD-10-CM | POA: Diagnosis not present

## 2022-10-07 DIAGNOSIS — M2560 Stiffness of unspecified joint, not elsewhere classified: Secondary | ICD-10-CM | POA: Diagnosis not present

## 2022-10-07 DIAGNOSIS — R269 Unspecified abnormalities of gait and mobility: Secondary | ICD-10-CM | POA: Diagnosis not present

## 2022-10-07 NOTE — Therapy (Signed)
OUTPATIENT PHYSICAL THERAPY THORACOLUMBAR TREATMENT/RECERT  Patient Name: Larry Blair MRN: [id number] DOB:03-Jul-1955, 67 y.o., male Today's Date: 10/08/2022  END OF SESSION:  PT End of Session - 10/07/22 1516     Visit Number 24    Number of Visits 48    Date for PT Re-Evaluation 12/30/22    Progress Note Due on Visit 20    PT Start Time 1517    PT Stop Time 1600    PT Time Calculation (min) 43 min    Equipment Utilized During Treatment Gait belt    Activity Tolerance Patient tolerated treatment well;No increased pain    Behavior During Therapy WFL for tasks assessed/performed                Past Medical History:  Diagnosis Date   Allergy    Arthritis    right shoulder   Asthma    Eczema    HTN (hypertension)    Hyperlipidemia    Substance abuse (HCC) 1976   daily   Past Surgical History:  Procedure Laterality Date   ANTERIOR CERVICAL DECOMP/DISCECTOMY FUSION N/A 08/30/2019   Procedure: CERVICAL THREE-FOUR ANTERIOR CERVICAL DECOMPRESSION/FUSION;  Surgeon: Tia Alert, MD;  Location: [facility] OR;  Service: Neurosurgery;  Laterality: N/A;   COLONOSCOPY     KNEE ARTHROSCOPY Left    Patient Active Problem List   Diagnosis Date Noted   Encounter for general adult medical examination with abnormal findings 07/04/2022   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 Sanda Linger  REFERRING PROVIDER: Dr. Sanda Linger  REFERRING DIAG: Chronic bilateral Low back pain without Sciatica  Rationale for Evaluation and Treatment:  Rehabilitation  THERAPY DIAG:  Muscle weakness (generalized)  Unsteadiness on feet  Difficulty in walking, not elsewhere classified  Chronic bilateral low back pain without sciatica  Abnormality of gait and mobility  Other lack of coordination  Difficulty in walking  Joint stiffness  ONSET DATE: 08/27/2022  SUBJECTIVE:                                                                                                                                                                                           SUBJECTIVE STATEMENT:  Patient reports increased back pain over  the weekend due to work. States overall doing okay and denies any falls.    PERTINENT HISTORY:  Fall with neck surgery - started it all - damaged vertebrae in my neck- 2021.- s/p ACDF procedure, remote knee scope.   PAIN:  Are you having pain? No  PRECAUTIONS: Fall  WEIGHT BEARING RESTRICTIONS: No  FALLS:  Has patient fallen in last 6 months? 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   NEXT MD VISIT: No follow up  OBJECTIVE:   DIAGNOSTIC FINDINGS:  CLINICAL DATA:  C-spine fusion   EXAM: CERVICAL SPINE - 2-3 VIEW; DG C-ARM 1-60 MIN   COMPARISON:  MRI 08/26/2019   FINDINGS: Three low resolution intraoperative spot views of the cervical spine. Total fluoroscopy time was 7 seconds.   Initial image demonstrates localizing instrument anterior to C4. Subsequent images demonstrate placement of surgical plate and fixating screws with interbody device at C3-C4.   IMPRESSION: Intraoperative fluoroscopic assistance provided  during cervical spine surgery.     Electronically Signed   By: Jasmine Pang M.D.   On: 08/30/2019 16:48    PATIENT SURVEYS:  Modified Oswestry 18  FOTO 47 with goal of 60  SCREENING FOR RED FLAGS: Bowel or bladder incontinence: No Spinal tumors: No Cauda equina syndrome: No Compression fracture: No Abdominal aneurysm: No  COGNITION: Overall cognitive status: Within functional limits for tasks assessed     SENSATION: Light touch: Impaired   MUSCLE LENGTH: Hamstrings: Right 90+ deg; Left 90+ deg   POSTURE: rounded shoulders and forward head  PALPATION: Tenderness throughout right side lumbar region  LUMBAR ROM:   AROM eval  Flexion Fingers to floor  Extension WNL  Right lateral flexion Fingers to mid calf  Left lateral flexion Fingers to mid calf  Right rotation WNL  Left rotation WNL   (Blank rows = not tested)  LOWER EXTREMITY ROM:     All B LE ROM= WNL   LOWER EXTREMITY MMT:    MMT Right eval Left eval R 5/8 L5/8  Hip flexion 3+ 3+ 3+ 4-  Hip extension 3+ 3+    Hip abduction 3+ 3+    Hip adduction 4 4 4 4   Hip internal rotation 3+ 3+ 3+ 3+  Hip external rotation 3+ 3+ 4- 4-  Knee flexion 4 4 4 4   Knee extension 4 4 4+ 4+  Ankle dorsiflexion 4 4 4+ 4+  Ankle plantarflexion 4 4 4 4   Ankle inversion 4 4 4 4   Ankle eversion 4 4 4 4    (Blank rows = not tested)  LUMBAR SPECIAL TESTS:  Straight leg raise test: Negative, Slump test: Negative, Single leg stance test: To be tested next visit, and FABER test: Negative  FUNCTIONAL TESTS:  5 times sit to stand: 19.23 sec without UE support 6 minute walk test: To be evaluated next visit 10 meter walk test: 15.11 and 15.49 sec without an UE support = 15.3 sec or 0.65 m/s Berg Balance Scale: To be evaluated next visit  GAIT:  Distance walked: approx 100 feet  Assistive device utilized: Single point cane Level of assistance: SBA Comments: Genu varus- excessive  Left LE; right LE decreased heel strike  with decreased step height and length, Foot adducted   TODAY'S TREATMENT:       10/07/2022                                                                                                                  Physical therapy treatment session today consisted of completing assessment of goals and administration of testing as demonstrated and documented in flow sheet, treatment, and goals section of this note. Addition treatments may be found below.   Throughout session PT provided CGA for weighted gait and dynamic weighted movements tactile cues to reduce fall risk and improved  pelvic translation with weight shift and improved COM control with obstacle navigation.     PATIENT EDUCATION:  Education details: Pt educated throughout session about proper posture and technique with exercises. Improved exercise technique, movement at target joints, use of target muscles after min to mod verbal, visual, tactile cues.  Person educated: Patient Education method: Explanation, Demonstration, Tactile cues, and Verbal cues Education comprehension: verbalized understanding, returned demonstration, verbal cues required, tactile cues required, and needs further education  HOME EXERCISE PROGRAM: Reviewed from last episode of care- will add to HEP as appropriate.   ASSESSMENT:  CLINICAL IMPRESSION:  Overall patient is progressing - improving back pain but not consistent and improved FGA score and self reported walking better without falling. He was experiencing some back pain over weekend and his 6 min walk test was declined since last tested but patient reported increased overall fatigue today. Added a new goal as patient reported ongoing issues with some transfers and inability to get up from floor. Patient's condition has the potential to improve in response to therapy. Maximum improvement is yet to be obtained. The anticipated improvement is attainable and reasonable in a generally predictable time.   Patient  will continue to benefit from skilled therapy to address remaining deficits in order to improve quality of life and return to PLOF.    OBJECTIVE IMPAIRMENTS: Abnormal gait, decreased activity tolerance, decreased balance, decreased endurance, decreased mobility, difficulty walking, decreased ROM, decreased strength, and pain.   ACTIVITY LIMITATIONS: carrying, lifting, bending, sitting, standing, squatting, stairs, transfers, and bed mobility  PARTICIPATION LIMITATIONS: cleaning, laundry, shopping, community activity, and yard work  PERSONAL FACTORS: Time since onset of injury/illness/exacerbation and 1-2 comorbidities: HTN, Arthritis, past cervical ACDF procedure in 2021  are also affecting patient's functional outcome.   REHAB POTENTIAL: Good  CLINICAL DECISION MAKING: Stable/uncomplicated  EVALUATION COMPLEXITY: Moderate   GOALS: Goals reviewed with patient? Yes  SHORT TERM GOALS: Target date: 08/26/2022  Pt. Independent with HEP to increase B LE strength 1/2 muscle grade to improve standing/ independence with gait.  Baseline: See chart in objective, several increases but some remain the same  Goal status: IN PROGRESS  2.  Pt will decrease worst back pain as reported on NPRS by at least 2 points in order to demonstrate clinically significant reduction in back pain.   Baseline: EVAL= up to  8/10 at worst 08/14/22: 3/10 at worst  Goal status: MET   LONG TERM GOALS: Target date: 12/30/2022  Pt will improve FOTO to target score  of  60 or greater to display perceived improvements in ability to complete ADL's.  Baseline: EVAL= 47 5/8: 56 6/17: 56%; 10/07/2022= Did not administer today as patient was just assessed 2 weeks ago-  Goal status: IN PROGRESS  2.  Pt will decrease MODI score by at least 8 points in order demonstrate clinically significant reduction in back pain/disability.  Baseline: EVAL= 18 5/8: 10 6/17: 9/50  Goal status: MET  3.  Pt will decrease worst back pain as  reported on NPRS by at least 3 points in order to demonstrate clinically significant reduction in back pain.   Baseline: EVAL= Up to 8/10 low back pain 08/14/22: 4/10 worst after work 6/1: 4/10; 10/07/2022- up to 5/10 after working 3 half days in a row.  Goal status: IN PROGRESS  4.  Pt will improve FGA by at least 3 points in order to demonstrate clinically significant improvement in balance.   Baseline: EVAL: to be initiated next visit; 07/17/2022= 18/30 08/14/22:19/30 6/17:  20/30 Goal status: IN PROGRESS  5.  Pt will decrease 5TSTS by at least 3 seconds in order to demonstrate clinically significant improvement in LE strength Baseline: EVAL= 19.23 sec  08/14/22:14.38 sec Goal status: MET  6.  Pt will increase by at least 0.13 m/s in order to demonstrate clinically significant improvement in community ambulation.   Baseline: EVAL= 0.65 m/s without UE support; 10/07/2022= 0.71 m/s Goal status: PROGRESSING   7. Patient will improved 6 min walk test > 1100 feet  consistently for improved gait efficiency and improved overall gait endurance with community activities.    Baseline: 07/17/2022= 1050 feet with cane 5/8: 1230 ft no AD; 10/07/2022= 950 feet    Goal status: REVISED   8. Patient will report/demonstrate independence with all car transfers/tub transfers and floor transfers to maximize his functional independence in home/community.    Baseline: 10/07/2022= Patient reports difficulty due    To pain/weakness at times with transfers   Goal status: INITIAL  PLAN:  PT FREQUENCY: 1-2x/week  PT DURATION: 12 weeks  PLANNED INTERVENTIONS: Therapeutic exercises, Therapeutic activity, Neuromuscular re-education, Balance training, Gait training, Patient/Family education, Self Care, Joint mobilization, Joint manipulation, Stair training, Vestibular training, Canalith repositioning, Orthotic/Fit training, DME instructions, Dry Needling, Spinal manipulation, Spinal mobilization, Cryotherapy, Moist heat,  Taping, and Manual therapy.  PLAN FOR NEXT SESSION:   Reassess HEP and adjust accordingly Continue with balance, Therex, and dynamic gait training   Manual therapy for LBP as needed     Louis Meckel, PT Physical Therapist - [facility]  [facility]  2:00 PM 10/08/22

## 2022-10-09 ENCOUNTER — Ambulatory Visit: Payer: Medicare Other

## 2022-10-14 ENCOUNTER — Ambulatory Visit: Payer: Medicare Other

## 2022-10-14 DIAGNOSIS — R269 Unspecified abnormalities of gait and mobility: Secondary | ICD-10-CM

## 2022-10-14 DIAGNOSIS — R2681 Unsteadiness on feet: Secondary | ICD-10-CM | POA: Diagnosis not present

## 2022-10-14 DIAGNOSIS — M2560 Stiffness of unspecified joint, not elsewhere classified: Secondary | ICD-10-CM | POA: Diagnosis not present

## 2022-10-14 DIAGNOSIS — M545 Low back pain, unspecified: Secondary | ICD-10-CM | POA: Diagnosis not present

## 2022-10-14 DIAGNOSIS — G8929 Other chronic pain: Secondary | ICD-10-CM | POA: Diagnosis not present

## 2022-10-14 DIAGNOSIS — R262 Difficulty in walking, not elsewhere classified: Secondary | ICD-10-CM

## 2022-10-14 DIAGNOSIS — R278 Other lack of coordination: Secondary | ICD-10-CM

## 2022-10-14 DIAGNOSIS — M6281 Muscle weakness (generalized): Secondary | ICD-10-CM | POA: Diagnosis not present

## 2022-10-14 NOTE — Therapy (Signed)
OUTPATIENT PHYSICAL THERAPY THORACOLUMBAR TREATMENT  Patient Name: Larry Blair MRN: [id number] DOB:Jun 20, 1955, 67 y.o., male Today's Date: 10/15/2022  END OF SESSION:  PT End of Session - 10/14/22 1637     Visit Number 25    Number of Visits 48    Date for PT Re-Evaluation 12/30/22    Progress Note Due on Visit 20    PT Start Time 1618    PT Stop Time 1700    PT Time Calculation (min) 42 min    Equipment Utilized During Treatment Gait belt    Activity Tolerance Patient tolerated treatment well;No increased pain    Behavior During Therapy WFL for tasks assessed/performed                Past Medical History:  Diagnosis Date   Allergy    Arthritis    right shoulder   Asthma    Eczema    HTN (hypertension)    Hyperlipidemia    Substance abuse (HCC) 1976   daily   Past Surgical History:  Procedure Laterality Date   ANTERIOR CERVICAL DECOMP/DISCECTOMY FUSION N/A 08/30/2019   Procedure: CERVICAL THREE-FOUR ANTERIOR CERVICAL DECOMPRESSION/FUSION;  Surgeon: Tia Alert, MD;  Location: [facility] OR;  Service: Neurosurgery;  Laterality: N/A;   COLONOSCOPY     KNEE ARTHROSCOPY Left    Patient Active Problem List   Diagnosis Date Noted   Encounter for general adult medical examination with abnormal findings 07/04/2022   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 Sanda Linger  REFERRING PROVIDER: Dr. Sanda Linger  REFERRING DIAG: Chronic bilateral Low back pain without Sciatica  Rationale for Evaluation and Treatment:  Rehabilitation  THERAPY DIAG:  Muscle weakness (generalized)  Unsteadiness on feet  Difficulty in walking, not elsewhere classified  Chronic bilateral low back pain without sciatica  Abnormality of gait and mobility  Other lack of coordination  Difficulty in walking  Joint stiffness  ONSET DATE: 08/27/2022  SUBJECTIVE:                                                                                                                                                                                           SUBJECTIVE STATEMENT:  Patient reports increased back pain again  over past week- Thinks it may be from excessive walking (on last day of PT including walking and balance test + the following day with community shopping).   PERTINENT HISTORY:  Fall with neck surgery - started it all - damaged vertebrae in my neck- 2021.- s/p ACDF procedure, remote knee scope.   PAIN:  Are you having pain? No  PRECAUTIONS: Fall  WEIGHT BEARING RESTRICTIONS: No  FALLS:  Has patient fallen in last 6 months? 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   NEXT MD VISIT: No follow up  OBJECTIVE:   DIAGNOSTIC FINDINGS:  CLINICAL DATA:  C-spine fusion   EXAM: CERVICAL SPINE - 2-3 VIEW; DG C-ARM 1-60 MIN   COMPARISON:  MRI 08/26/2019   FINDINGS: Three low resolution intraoperative spot views of the cervical spine. Total fluoroscopy time was 7 seconds.   Initial image demonstrates localizing instrument anterior to C4. Subsequent images demonstrate placement of surgical plate and fixating screws with interbody  device at C3-C4.   IMPRESSION: Intraoperative fluoroscopic assistance provided during cervical spine surgery.     Electronically Signed   By: Jasmine Pang M.D.   On: 08/30/2019 16:48    PATIENT SURVEYS:  Modified Oswestry 18  FOTO 47 with goal of 60  SCREENING FOR RED FLAGS: Bowel or bladder incontinence: No Spinal tumors: No Cauda equina syndrome: No Compression fracture: No Abdominal aneurysm: No  COGNITION: Overall cognitive status: Within functional limits for tasks assessed     SENSATION: Light touch: Impaired   MUSCLE LENGTH: Hamstrings: Right 90+ deg; Left 90+ deg   POSTURE: rounded shoulders and forward head  PALPATION: Tenderness throughout right side lumbar region  LUMBAR ROM:   AROM eval  Flexion Fingers to floor  Extension WNL  Right lateral flexion Fingers to mid calf  Left lateral flexion Fingers to mid calf  Right rotation WNL  Left rotation WNL   (Blank rows = not tested)  LOWER EXTREMITY ROM:     All B LE ROM= WNL   LOWER EXTREMITY MMT:    MMT Right eval Left eval R 5/8 L5/8  Hip flexion 3+ 3+ 3+ 4-  Hip extension 3+ 3+    Hip abduction 3+ 3+    Hip adduction 4 4 4 4   Hip internal rotation 3+ 3+ 3+ 3+  Hip external rotation 3+ 3+ 4- 4-  Knee flexion 4 4 4 4   Knee extension 4 4 4+ 4+  Ankle dorsiflexion 4 4 4+ 4+  Ankle plantarflexion 4 4 4 4   Ankle inversion 4 4 4 4   Ankle eversion 4 4 4 4    (Blank rows = not tested)  LUMBAR SPECIAL TESTS:  Straight leg raise test: Negative, Slump test: Negative, Single leg stance test: To be tested next visit, and FABER test: Negative  FUNCTIONAL TESTS:  5 times sit to stand: 19.23 sec without UE support 6 minute walk test: To be evaluated next visit 10 meter walk test: 15.11 and 15.49 sec without an UE support = 15.3 sec or 0.65 m/s Berg Balance Scale: To be evaluated next visit  GAIT:  Distance walked: approx 100 feet  Assistive  device utilized: Single point cane Level of  assistance: SBA Comments: Genu varus- excessive Left LE; right LE decreased heel strike with decreased step height and length, Foot adducted   TODAY'S TREATMENT:       10/14/2022                                                                                                                  THEREX:   PROM To lumbar/LE: 30 sec hold x 3 each of following  -SKC (no pain near full ROM)  - Hamstring (more restricted on left LE)   -Piriformis  - Figure 4  -Lower trunk rotation  - Hip circles  - Sidelye Left Hip flex (with knee flexed for more quad and then with leg more extended)   Sidelye hip ER with GTB 2 sets x 12 reps Prone hip ext (AROM) x 15 reps each LE Prone Hip IR (AROM) x 15 reps each LE Prone Hip mobilization with LE on towel roll- 20 reps into IR/ER      PATIENT EDUCATION:  Education details: Pt educated throughout session about proper posture and technique with exercises. Improved exercise technique, movement at target joints, use of target muscles after min to mod verbal, visual, tactile cues.  Person educated: Patient Education method: Explanation, Demonstration, Tactile cues, and Verbal cues Education comprehension: verbalized understanding, returned demonstration, verbal cues required, tactile cues required, and needs further education  HOME EXERCISE PROGRAM: Reviewed from last episode of care- will add to HEP as appropriate.   ASSESSMENT:  CLINICAL IMPRESSION:  Patient returned to clinic today with increased report of right sided low back pain since last week. He responded well to ROM activities and later some low back and hip mobilization/strengthening. He reported feeling less stiff and better overall after session.  Patient will continue to benefit from skilled therapy to address remaining deficits in order to improve quality of life and return to PLOF.    OBJECTIVE IMPAIRMENTS: Abnormal gait, decreased activity tolerance, decreased balance, decreased endurance,  decreased mobility, difficulty walking, decreased ROM, decreased strength, and pain.   ACTIVITY LIMITATIONS: carrying, lifting, bending, sitting, standing, squatting, stairs, transfers, and bed mobility  PARTICIPATION LIMITATIONS: cleaning, laundry, shopping, community activity, and yard work  PERSONAL FACTORS: Time since onset of injury/illness/exacerbation and 1-2 comorbidities: HTN, Arthritis, past cervical ACDF procedure in 2021  are also affecting patient's functional outcome.   REHAB POTENTIAL: Good  CLINICAL DECISION MAKING: Stable/uncomplicated  EVALUATION COMPLEXITY: Moderate   GOALS: Goals reviewed with patient? Yes  SHORT TERM GOALS: Target date: 08/26/2022  Pt. Independent with HEP to increase B LE strength 1/2 muscle grade to improve standing/ independence with gait.  Baseline: See chart in objective, several increases but some remain the same  Goal status: IN PROGRESS  2.  Pt will decrease worst back pain as reported on NPRS by at least 2 points in order to demonstrate clinically significant reduction in back pain.   Baseline: EVAL= up to 8/10 at worst 08/14/22: 3/10 at worst  Goal  status: MET   LONG TERM GOALS: Target date: 12/30/2022  Pt will improve FOTO to target score  of  60 or greater to display perceived improvements in ability to complete ADL's.  Baseline: EVAL= 47 5/8: 56 6/17: 56%; 10/07/2022= Did not administer today as patient was just assessed 2 weeks ago-  Goal status: IN PROGRESS  2.  Pt will decrease MODI score by at least 8 points in order demonstrate clinically significant reduction in back pain/disability.  Baseline: EVAL= 18 5/8: 10 6/17: 9/50  Goal status: MET  3.  Pt will decrease worst back pain as reported on NPRS by at least 3 points in order to demonstrate clinically significant reduction in back pain.   Baseline: EVAL= Up to 8/10 low back pain 08/14/22: 4/10 worst after work 6/1: 4/10; 10/07/2022- up to 5/10 after working 3 half days in a row.   Goal status: IN PROGRESS  4.  Pt will improve FGA by at least 3 points in order to demonstrate clinically significant improvement in balance.   Baseline: EVAL: to be initiated next visit; 07/17/2022= 18/30 08/14/22:19/30 6/17:  20/30 Goal status: IN PROGRESS  5.  Pt will decrease 5TSTS by at least 3 seconds in order to demonstrate clinically significant improvement in LE strength Baseline: EVAL= 19.23 sec  08/14/22:14.38 sec Goal status: MET  6.  Pt will increase by at least 0.13 m/s in order to demonstrate clinically significant improvement in community ambulation.   Baseline: EVAL= 0.65 m/s without UE support; 10/07/2022= 0.71 m/s Goal status: PROGRESSING   7. Patient will improved 6 min walk test > 1100 feet  consistently for improved gait efficiency and improved overall gait endurance with community activities.    Baseline: 07/17/2022= 1050 feet with cane 5/8: 1230 ft no AD; 10/07/2022= 950 feet    Goal status: REVISED   8. Patient will report/demonstrate independence with all car transfers/tub transfers and floor transfers to maximize his functional independence in home/community.    Baseline: 10/07/2022= Patient reports difficulty due    To pain/weakness at times with transfers   Goal status: INITIAL  PLAN:  PT FREQUENCY: 1-2x/week  PT DURATION: 12 weeks  PLANNED INTERVENTIONS: Therapeutic exercises, Therapeutic activity, Neuromuscular re-education, Balance training, Gait training, Patient/Family education, Self Care, Joint mobilization, Joint manipulation, Stair training, Vestibular training, Canalith repositioning, Orthotic/Fit training, DME instructions, Dry Needling, Spinal manipulation, Spinal mobilization, Cryotherapy, Moist heat, Taping, and Manual therapy.  PLAN FOR NEXT SESSION:   Reassess HEP and adjust accordingly Continue with balance, Therex, and dynamic gait training   Manual therapy for LBP as needed      Louis Meckel, PT Physical Therapist - [facility]  10:05 AM 10/15/22

## 2022-10-15 NOTE — Therapy (Signed)
OUTPATIENT PHYSICAL THERAPY THORACOLUMBAR TREATMENT  Patient Name: Yadier Bramhall MRN: [id number] DOB:12/21/1955, 67 y.o., male Today's Date: 10/15/2022  END OF SESSION:       Past Medical History:  Diagnosis Date   Allergy    Arthritis    right shoulder   Asthma    Eczema    HTN (hypertension)    Hyperlipidemia    Substance abuse (HCC) 1976   daily   Past Surgical History:  Procedure Laterality Date   ANTERIOR CERVICAL DECOMP/DISCECTOMY FUSION N/A 08/30/2019   Procedure: CERVICAL THREE-FOUR ANTERIOR CERVICAL DECOMPRESSION/FUSION;  Surgeon: Tia Alert, MD;  Location: [facility] OR;  Service: Neurosurgery;  Laterality: N/A;   COLONOSCOPY     KNEE ARTHROSCOPY Left    Patient Active Problem List   Diagnosis Date Noted   Encounter for general adult medical examination with abnormal findings 07/04/2022   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 Sanda Linger  REFERRING PROVIDER: Dr. Sanda Linger  REFERRING DIAG: Chronic bilateral Low back pain without Sciatica  Rationale for Evaluation and Treatment: Rehabilitation  THERAPY DIAG:  No diagnosis found.  ONSET DATE: 08/27/2022  SUBJECTIVE:                                                                                                                                                                                           SUBJECTIVE STATEMENT:  Patient reports increased back pain again over past week- Thinks it may be from excessive walking (on last day of PT including walking and  balance test + the following day with community shopping).   PERTINENT HISTORY:  Fall with neck surgery - started it all - damaged vertebrae in my neck- 2021.- s/p ACDF procedure, remote knee scope.   PAIN:  Are you having pain? No  PRECAUTIONS: Fall  WEIGHT BEARING RESTRICTIONS: No  FALLS:  Has patient fallen in last 6 months? 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   NEXT MD VISIT: No follow up  OBJECTIVE:   DIAGNOSTIC FINDINGS:  CLINICAL DATA:  C-spine fusion   EXAM: CERVICAL SPINE - 2-3 VIEW; DG C-ARM 1-60 MIN   COMPARISON:  MRI 08/26/2019   FINDINGS: Three low resolution intraoperative spot views of the cervical spine. Total fluoroscopy time was 7 seconds.   Initial image demonstrates localizing instrument anterior to C4. Subsequent images demonstrate placement of surgical plate and fixating screws with interbody device at C3-C4.   IMPRESSION: Intraoperative fluoroscopic assistance provided during cervical spine surgery.     Electronically Signed   By: Jasmine Pang M.D.   On: 08/30/2019 16:48    PATIENT SURVEYS:  Modified Oswestry 18  FOTO 47 with goal of 60  SCREENING FOR RED FLAGS: Bowel or bladder incontinence: No Spinal tumors: No Cauda equina syndrome: No Compression fracture: No Abdominal aneurysm: No  COGNITION: Overall cognitive status: Within functional limits for tasks assessed     SENSATION: Light touch: Impaired   MUSCLE LENGTH: Hamstrings: Right 90+ deg; Left 90+ deg   POSTURE: rounded shoulders and forward head  PALPATION: Tenderness throughout  right side lumbar region  LUMBAR ROM:   AROM eval  Flexion Fingers to floor  Extension WNL  Right lateral flexion Fingers to mid calf  Left lateral flexion Fingers to mid calf  Right rotation WNL  Left rotation WNL   (Blank rows = not tested)  LOWER EXTREMITY ROM:     All B LE ROM= WNL   LOWER EXTREMITY MMT:    MMT Right eval Left eval R 5/8 L5/8  Hip flexion 3+ 3+ 3+ 4-  Hip extension 3+ 3+    Hip abduction 3+ 3+    Hip adduction 4 4 4 4   Hip internal rotation 3+ 3+ 3+ 3+  Hip external rotation 3+ 3+ 4- 4-  Knee flexion 4 4 4 4   Knee extension 4 4 4+ 4+  Ankle dorsiflexion 4 4 4+ 4+  Ankle plantarflexion 4 4 4 4   Ankle inversion 4 4 4 4   Ankle eversion 4 4 4 4    (Blank rows = not tested)  LUMBAR SPECIAL TESTS:  Straight leg raise test: Negative, Slump test: Negative, Single leg stance test: To be tested next visit, and FABER test: Negative  FUNCTIONAL TESTS:  5 times sit to stand: 19.23 sec without UE support 6 minute walk test: To be evaluated next visit 10 meter walk test: 15.11 and 15.49 sec without an UE support = 15.3 sec or 0.65 m/s Berg Balance Scale: To be evaluated next visit  GAIT:  Distance walked: approx 100 feet  Assistive device utilized: Single point cane Level of assistance: SBA Comments: Genu varus- excessive Left LE; right LE decreased heel strike with decreased step height and length, Foot adducted   TODAY'S TREATMENT:  10/15/22  Unless otherwise stated, CGA was provided and gait belt donned in order to ensure pt safety  TherEx: (*** min) -*** x ***: hip mobility in tall kneeling to step up position re: on mat table with box to support trunk (intro to floor t/f) -figure 4 stretch  -sidelying book openers for thoracic extension and rotation -hip flexor stretch (thomas position or tall kneeling)   TherAct: (*** min) - *** x ***: floor transfer   NMRE: (*** min) - *** x ***:   Manual Therapy: (*** min) - *** x ***: Fascial  manipulation x *** minutes to ***  mm with noted resting tension at ***, in *** position. Pt reported highest of *** pain with manual pressure, which relieved after bouts of tissue mobilization in multi-directional pattern.                                                                                                 THEREX:   PROM To lumbar/LE: 30 sec hold x 3 each of following  -SKC (no pain near full ROM)  - Hamstring (more restricted on left LE)   -Piriformis  - Figure 4  -Lower trunk rotation  - Hip circles  - Sidelye Left Hip flex (with knee flexed for more quad and then with leg more extended)   Sidelye hip ER with GTB 2 sets x 12 reps Prone hip ext (AROM) x 15 reps each LE Prone Hip IR (AROM) x 15 reps each LE Prone Hip mobilization with LE on towel roll- 20 reps into IR/ER      PATIENT EDUCATION:  Education details: Pt educated throughout session about proper posture and technique with exercises. Improved exercise technique, movement at target joints, use of target muscles after min to mod verbal, visual, tactile cues.  Person educated: Patient Education method: Explanation, Demonstration, Tactile cues, and Verbal cues Education comprehension: verbalized understanding, returned demonstration, verbal cues required, tactile cues required, and needs further education  HOME EXERCISE PROGRAM: Reviewed from last episode of care- will add to HEP as appropriate.   ASSESSMENT:  CLINICAL IMPRESSION:  Patient returned to clinic today with increased report of right sided low back pain since last week. He responded well to ROM activities and later some low back and hip mobilization/strengthening. He reported feeling less stiff and better overall after session.  Patient will continue to benefit from skilled therapy to address remaining deficits in order to improve quality of life and return to PLOF.    OBJECTIVE IMPAIRMENTS: Abnormal gait, decreased activity tolerance, decreased  balance, decreased endurance, decreased mobility, difficulty walking, decreased ROM, decreased strength, and pain.   ACTIVITY LIMITATIONS: carrying, lifting, bending, sitting, standing, squatting, stairs, transfers, and bed mobility  PARTICIPATION LIMITATIONS: cleaning, laundry, shopping, community activity, and yard work  PERSONAL FACTORS: Time since onset of injury/illness/exacerbation and 1-2 comorbidities: HTN, Arthritis, past cervical ACDF procedure in 2021  are also affecting patient's functional outcome.   REHAB POTENTIAL: Good  CLINICAL DECISION MAKING: Stable/uncomplicated  EVALUATION COMPLEXITY: Moderate   GOALS: Goals reviewed with patient? Yes  SHORT TERM GOALS: Target date: 08/26/2022  Pt. Independent with HEP to increase B LE strength 1/2 muscle grade to improve standing/ independence with gait.  Baseline: See chart in objective, several increases but some remain the same  Goal status: IN PROGRESS  2.  Pt will decrease worst back pain as reported on NPRS by at least 2 points in order to demonstrate clinically significant reduction in back pain.   Baseline: EVAL= up to 8/10 at worst 08/14/22: 3/10 at worst  Goal status: MET   LONG TERM GOALS: Target date: 12/30/2022  Pt will improve FOTO to target score  of  60 or greater to display perceived improvements  in ability to complete ADL's.  Baseline: EVAL= 47 5/8: 56 6/17: 56%; 10/07/2022= Did not administer today as patient was just assessed 2 weeks ago-  Goal status: IN PROGRESS  2.  Pt will decrease MODI score by at least 8 points in order demonstrate clinically significant reduction in back pain/disability.  Baseline: EVAL= 18 5/8: 10 6/17: 9/50  Goal status: MET  3.  Pt will decrease worst back pain as reported on NPRS by at least 3 points in order to demonstrate clinically significant reduction in back pain.   Baseline: EVAL= Up to 8/10 low back pain 08/14/22: 4/10 worst after work 6/1: 4/10; 10/07/2022- up to 5/10 after  working 3 half days in a row.  Goal status: IN PROGRESS  4.  Pt will improve FGA by at least 3 points in order to demonstrate clinically significant improvement in balance.   Baseline: EVAL: to be initiated next visit; 07/17/2022= 18/30 08/14/22:19/30 6/17:  20/30 Goal status: IN PROGRESS  5.  Pt will decrease 5TSTS by at least 3 seconds in order to demonstrate clinically significant improvement in LE strength Baseline: EVAL= 19.23 sec  08/14/22:14.38 sec Goal status: MET  6.  Pt will increase by at least 0.13 m/s in order to demonstrate clinically significant improvement in community ambulation.   Baseline: EVAL= 0.65 m/s without UE support; 10/07/2022= 0.71 m/s Goal status: PROGRESSING   7. Patient will improved 6 min walk test > 1100 feet  consistently for improved gait efficiency and improved overall gait endurance with community activities.    Baseline: 07/17/2022= 1050 feet with cane 5/8: 1230 ft no AD; 10/07/2022= 950 feet    Goal status: REVISED   8. Patient will report/demonstrate independence with all car transfers/tub transfers and floor transfers to maximize his functional independence in home/community.    Baseline: 10/07/2022= Patient reports difficulty due    To pain/weakness at times with transfers   Goal status: INITIAL  PLAN:  PT FREQUENCY: 1-2x/week  PT DURATION: 12 weeks  PLANNED INTERVENTIONS: Therapeutic exercises, Therapeutic activity, Neuromuscular re-education, Balance training, Gait training, Patient/Family education, Self Care, Joint mobilization, Joint manipulation, Stair training, Vestibular training, Canalith repositioning, Orthotic/Fit training, DME instructions, Dry Needling, Spinal manipulation, Spinal mobilization, Cryotherapy, Moist heat, Taping, and Manual therapy.  PLAN FOR NEXT SESSION:   Reassess HEP and adjust accordingly Continue with balance, Therex, and dynamic gait training   Manual therapy for LBP as needed      Louis Meckel,  PT Physical Therapist - [facility]  [facility]  1:33 PM 10/15/22

## 2022-10-16 ENCOUNTER — Ambulatory Visit: Payer: Medicare Other

## 2022-10-16 DIAGNOSIS — R278 Other lack of coordination: Secondary | ICD-10-CM | POA: Diagnosis not present

## 2022-10-16 DIAGNOSIS — M2560 Stiffness of unspecified joint, not elsewhere classified: Secondary | ICD-10-CM | POA: Diagnosis not present

## 2022-10-16 DIAGNOSIS — R262 Difficulty in walking, not elsewhere classified: Secondary | ICD-10-CM | POA: Diagnosis not present

## 2022-10-16 DIAGNOSIS — R2681 Unsteadiness on feet: Secondary | ICD-10-CM | POA: Diagnosis not present

## 2022-10-16 DIAGNOSIS — R269 Unspecified abnormalities of gait and mobility: Secondary | ICD-10-CM | POA: Diagnosis not present

## 2022-10-16 DIAGNOSIS — G8929 Other chronic pain: Secondary | ICD-10-CM | POA: Diagnosis not present

## 2022-10-16 DIAGNOSIS — M545 Low back pain, unspecified: Secondary | ICD-10-CM | POA: Diagnosis not present

## 2022-10-16 DIAGNOSIS — M6281 Muscle weakness (generalized): Secondary | ICD-10-CM | POA: Diagnosis not present

## 2022-10-21 ENCOUNTER — Ambulatory Visit: Payer: Medicare Other

## 2022-10-21 DIAGNOSIS — R278 Other lack of coordination: Secondary | ICD-10-CM | POA: Diagnosis not present

## 2022-10-21 DIAGNOSIS — R2681 Unsteadiness on feet: Secondary | ICD-10-CM | POA: Diagnosis not present

## 2022-10-21 DIAGNOSIS — M2560 Stiffness of unspecified joint, not elsewhere classified: Secondary | ICD-10-CM | POA: Diagnosis not present

## 2022-10-21 DIAGNOSIS — R269 Unspecified abnormalities of gait and mobility: Secondary | ICD-10-CM | POA: Diagnosis not present

## 2022-10-21 DIAGNOSIS — M545 Low back pain, unspecified: Secondary | ICD-10-CM

## 2022-10-21 DIAGNOSIS — M6281 Muscle weakness (generalized): Secondary | ICD-10-CM

## 2022-10-21 DIAGNOSIS — R262 Difficulty in walking, not elsewhere classified: Secondary | ICD-10-CM | POA: Diagnosis not present

## 2022-10-21 DIAGNOSIS — G8929 Other chronic pain: Secondary | ICD-10-CM | POA: Diagnosis not present

## 2022-10-21 NOTE — Therapy (Signed)
OUTPATIENT PHYSICAL THERAPY THORACOLUMBAR TREATMENT  Patient Name: Larry Blair MRN: [id number] DOB:[birth year], 67 y.o., male Today's Date: 10/21/2022  END OF SESSION:  PT End of Session - 10/21/22 1623     Visit Number 27    Number of Visits 48    Date for PT Re-Evaluation 12/30/22    Authorization Type BCBS Medicare    Progress Note Due on Visit 30    PT Start Time 1620    PT Stop Time 1700    PT Time Calculation (min) 40 min    Equipment Utilized During Treatment Gait belt    Activity Tolerance Patient tolerated treatment well;No increased pain    Behavior During Therapy WFL for tasks assessed/performed             Past Medical History:  Diagnosis Date   Allergy    Arthritis    right shoulder   Asthma    Eczema    HTN (hypertension)    Hyperlipidemia    Substance abuse (HCC) 1976   daily   Past Surgical History:  Procedure Laterality Date   ANTERIOR CERVICAL DECOMP/DISCECTOMY FUSION N/A 08/30/2019   Procedure: CERVICAL THREE-FOUR ANTERIOR CERVICAL DECOMPRESSION/FUSION;  Surgeon: Tia Alert, MD;  Location: [facility] OR;  Service: Neurosurgery;  Laterality: N/A;   COLONOSCOPY     KNEE ARTHROSCOPY Left    Patient Active Problem List   Diagnosis Date Noted   Encounter for general adult medical examination with abnormal findings 07/04/2022   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 Sanda Linger  REFERRING PROVIDER: Dr. Sanda Linger  REFERRING DIAG: Chronic bilateral Low back pain without Sciatica  Rationale for  Evaluation and Treatment: Rehabilitation  THERAPY DIAG:  Muscle weakness (generalized)  Unsteadiness on feet  Difficulty in walking, not elsewhere classified  Chronic bilateral low back pain without sciatica  Abnormality of gait and mobility  Other lack of coordination  Difficulty in walking  Joint stiffness  ONSET DATE: 08/27/2022  SUBJECTIVE:                                                                                                                                                                                           SUBJECTIVE STATEMENT:  Pt says  he is doing well today. He did some stretching prior to coming in.   PERTINENT HISTORY:  Fall with neck surgery - started it all - damaged vertebrae in my neck- 2021- s/p ACDF procedure, remote knee scope.   PAIN:  Are you having pain? No  PRECAUTIONS: Fall  WEIGHT BEARING RESTRICTIONS: No  FALLS:  Has patient fallen in last 6 months? No  PATIENT GOALS: Patient reports he wants to be able to walk again without a cane if possible.   NEXT MD VISIT: No follow up  OBJECTIVE:   Light touch: Impaired    FUNCTIONAL TESTS:  5 times sit to stand: 19.23 sec without UE support 6 minute walk test: To be evaluated next visit 10 meter walk test: 15.11 and 15.49 sec without an UE support = 15.3 sec or 0.65 m/s Berg Balance Scale: To be evaluated next visit   TODAY'S TREATMENT:  10/21/22 Hooklying Stretching            -SKTC 2x30sec bilat             -Figure 4 stretch 2x30sec bilat            -Lower trunk rotation 2x30sec bilat             RedTB clamshells   1x20 Hooklying marching  1x20, alternating sides, red TB at distal thigh Hooklying bridge 1x15, green ball between knees      STS from elevated surface 1x15  Tiny side step with redTB at knees 1x10 bilat  STS from elevated surface 1x15   Lateral stepping, side stepping in main hallway x5 minutes   PATIENT EDUCATION:  Education details: Pt educated  throughout session about proper posture and technique with exercises. Improved exercise technique, movement at target joints, use of target muscles after min to mod verbal, visual, tactile cues.  Person educated: Patient Education method: Explanation, Demonstration, Tactile cues, and Verbal cues Education comprehension: verbalized understanding, returned demonstration, verbal cues required, tactile cues required, and needs further education  HOME EXERCISE PROGRAM: Access Code: [email or web address] URL: [email or web address] Date: 10/16/2022 Prepared by: Precious Bard Exercises - Standing Hip Flexor Stretch  - 1 x daily - 7 x weekly - 2 sets - 2 reps - 60 hold   Reviewed from last episode of care- will add to HEP as appropriate.   ASSESSMENT:  CLINICAL IMPRESSION:  Continued to work on gentle stretching and strengthening in legs. Patient will continue to benefit from skilled therapy to address remaining deficits in order to improve quality of life and return to PLOF.    OBJECTIVE IMPAIRMENTS: Abnormal gait, decreased activity tolerance, decreased balance, decreased endurance, decreased mobility, difficulty walking, decreased ROM, decreased strength, and pain.   ACTIVITY LIMITATIONS: carrying, lifting, bending, sitting, standing, squatting, stairs, transfers, and bed mobility  PARTICIPATION LIMITATIONS: cleaning, laundry, shopping, community activity, and yard work  PERSONAL FACTORS: Time since onset of injury/illness/exacerbation and 1-2 comorbidities: HTN, Arthritis, past cervical ACDF procedure in 2021  are also affecting patient's functional outcome.   REHAB POTENTIAL: Good  CLINICAL DECISION MAKING: Stable/uncomplicated  EVALUATION COMPLEXITY: Moderate   GOALS: Goals reviewed with patient? Yes  SHORT TERM GOALS: Target date: 08/26/2022  Pt. Independent with HEP to increase B LE strength 1/2 muscle grade to improve standing/ independence with gait.  Baseline: See chart in  objective, several increases but some remain the same  Goal status: IN PROGRESS  2.  Pt will decrease worst back pain as reported on NPRS by at least 2 points in  order to demonstrate clinically significant reduction in back pain.  Baseline: EVAL= up to 8/10 at worst 08/14/22: 3/10 at worst  Goal status: MET   LONG TERM GOALS: Target date: 12/30/2022  Pt will improve FOTO to target score  of  60 or greater to display perceived improvements in ability to complete ADL's.  Baseline: EVAL= 47 5/8: 56 6/17: 56%; 10/07/2022= Did not administer today as patient was just assessed 2 weeks ago-  Goal status: IN PROGRESS  2.  Pt will decrease MODI score by at least 8 points in order demonstrate clinically significant reduction in back pain/disability.  Baseline: EVAL= 18 5/8: 10 6/17: 9/50  Goal status: MET  3.  Pt will decrease worst back pain as reported on NPRS by at least 3 points in order to demonstrate clinically significant reduction in back pain.  Baseline: EVAL= Up to 8/10 low back pain 08/14/22: 4/10 worst after work 6/1: 4/10; 10/07/2022- up to 5/10 after working 3 half days in a row.  Goal status: IN PROGRESS  4.  Pt will improve FGA by at least 3 points in order to demonstrate clinically significant improvement in balance.   Baseline: EVAL: to be initiated next visit; 07/17/2022= 18/30 08/14/22:19/30 6/17:  20/30 Goal status: IN PROGRESS  5.  Pt will decrease 5TSTS by at least 3 seconds in order to demonstrate clinically significant improvement in LE strength Baseline: EVAL= 19.23 sec  08/14/22:14.38 sec Goal status: MET  6.  Pt will increase by at least 0.13 m/s in order to demonstrate clinically significant improvement in community ambulation.   Baseline: EVAL= 0.65 m/s without UE support; 10/07/2022= 0.71 m/s Goal status: PROGRESSING   7. Patient will improved 6 min walk test > 1100 feet  consistently for improved gait efficiency and improved overall gait endurance with community  activities.    Baseline: 07/17/2022= 1050 feet with cane 5/8: 1230 ft no AD; 10/07/2022= 950 feet    Goal status: REVISED   8. Patient will report/demonstrate independence with all car transfers/tub transfers and floor transfers to maximize his functional independence in home/community.    Baseline: 10/07/2022= Patient reports difficulty due    To pain/weakness at times with transfers   Goal status: INITIAL  PLAN:  PT FREQUENCY: 1-2x/week  PT DURATION: 12 weeks  PLANNED INTERVENTIONS: Therapeutic exercises, Therapeutic activity, Neuromuscular re-education, Balance training, Gait training, Patient/Family education, Self Care, Joint mobilization, Joint manipulation, Stair training, Vestibular training, Canalith repositioning, Orthotic/Fit training, DME instructions, Dry Needling, Spinal manipulation, Spinal mobilization, Cryotherapy, Moist heat, Taping, and Manual therapy.  PLAN FOR NEXT SESSION:  Continue with balance, Therex, and dynamic gait training    4:26 PM, 10/21/22 Rosamaria Lints, PT, DPT Physical Therapist - [facility]  [phone] [facility])     4:25 PM 10/21/22

## 2022-10-22 ENCOUNTER — Encounter: Payer: Self-pay | Admitting: Gastroenterology

## 2022-10-22 ENCOUNTER — Telehealth: Payer: Self-pay

## 2022-10-22 NOTE — Telephone Encounter (Signed)
Called and LVM for patient to return the office call to have him scheduled for a AWV phone call

## 2022-10-23 ENCOUNTER — Ambulatory Visit: Payer: Medicare Other | Admitting: Physical Therapy

## 2022-10-28 ENCOUNTER — Ambulatory Visit: Payer: Medicare Other

## 2022-10-30 ENCOUNTER — Ambulatory Visit: Payer: Medicare Other | Admitting: Physical Therapy

## 2022-10-30 ENCOUNTER — Encounter: Payer: Self-pay | Admitting: Physical Therapy

## 2022-10-30 DIAGNOSIS — M545 Low back pain, unspecified: Secondary | ICD-10-CM | POA: Diagnosis not present

## 2022-10-30 DIAGNOSIS — M2560 Stiffness of unspecified joint, not elsewhere classified: Secondary | ICD-10-CM | POA: Diagnosis not present

## 2022-10-30 DIAGNOSIS — R262 Difficulty in walking, not elsewhere classified: Secondary | ICD-10-CM | POA: Diagnosis not present

## 2022-10-30 DIAGNOSIS — G8929 Other chronic pain: Secondary | ICD-10-CM | POA: Diagnosis not present

## 2022-10-30 DIAGNOSIS — R278 Other lack of coordination: Secondary | ICD-10-CM | POA: Diagnosis not present

## 2022-10-30 DIAGNOSIS — R2681 Unsteadiness on feet: Secondary | ICD-10-CM | POA: Diagnosis not present

## 2022-10-30 DIAGNOSIS — R269 Unspecified abnormalities of gait and mobility: Secondary | ICD-10-CM | POA: Diagnosis not present

## 2022-10-30 DIAGNOSIS — M6281 Muscle weakness (generalized): Secondary | ICD-10-CM

## 2022-10-30 NOTE — Therapy (Signed)
OUTPATIENT PHYSICAL THERAPY THORACOLUMBAR TREATMENT  Patient Name: Larry Blair MRN: [id number] DOB:11-May-1955, 67 y.o., male Today's Date: 10/30/2022  END OF SESSION:  PT End of Session - 10/30/22 1713     Visit Number 28    Number of Visits 48    Date for PT Re-Evaluation 12/30/22    Authorization Type BCBS Medicare    Progress Note Due on Visit 30    PT Start Time 1530    PT Stop Time 1613    PT Time Calculation (min) 43 min    Equipment Utilized During Treatment Gait belt    Activity Tolerance Patient tolerated treatment well;No increased pain    Behavior During Therapy WFL for tasks assessed/performed              Past Medical History:  Diagnosis Date   Allergy    Arthritis    right shoulder   Asthma    Eczema    HTN (hypertension)    Hyperlipidemia    Substance abuse (HCC) 1976   daily   Past Surgical History:  Procedure Laterality Date   ANTERIOR CERVICAL DECOMP/DISCECTOMY FUSION N/A 08/30/2019   Procedure: CERVICAL THREE-FOUR ANTERIOR CERVICAL DECOMPRESSION/FUSION;  Surgeon: Tia Alert, MD;  [facility] OR;  Service: Neurosurgery;  Laterality: N/A;   COLONOSCOPY     KNEE ARTHROSCOPY Left    Patient Active Problem List   Diagnosis Date Noted   Encounter for general adult medical examination with abnormal findings 07/04/2022   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 Sanda Linger  REFERRING PROVIDER: Dr. Sanda Linger  REFERRING DIAG: Chronic bilateral Low back pain without Sciatica  Rationale for  Evaluation and Treatment: Rehabilitation  THERAPY DIAG:  Muscle weakness (generalized)  Unsteadiness on feet  Difficulty in walking, not elsewhere classified  ONSET DATE: 08/27/2022  SUBJECTIVE:                                                                                                                                                                                           SUBJECTIVE STATEMENT:  Pt says he is doing well today. Had pain following work last week but is has improved.   PERTINENT HISTORY:  Fall with neck surgery -  started it all - damaged vertebrae in my neck- 2021- s/p ACDF procedure, remote knee scope.   PAIN:  Are you having pain? No  PRECAUTIONS: Fall  WEIGHT BEARING RESTRICTIONS: No  FALLS:  Has patient fallen in last 6 months? No  PATIENT GOALS: Patient reports he wants to be able to walk again without a cane if possible.   NEXT MD VISIT: No follow up  OBJECTIVE:   Light touch: Impaired    FUNCTIONAL TESTS:  5 times sit to stand: 19.23 sec without UE support 6 minute walk test: To be evaluated next visit 10 meter walk test: 15.11 and 15.49 sec without an UE support = 15.3 sec or 0.65 m/s Berg Balance Scale: To be evaluated next visit   TODAY'S TREATMENT:  10/30/22  STS 2 x 10  seated RedTB clamshells   1x20 Marching  1x20, alternating sides, red TB at distal thigh LAQ with hip adduction:  2 x 10, with 6# AW green ball between knees     Side step with redTB at knees 2 x 10 bilat   Floor to stand transfer practice  10 x reverse lunges ea LE  10 x ea LE PWR! Step in modified all 4s  10 x all 4s PWR! Up x 10  1 x floor to stand transfer, pt reports improved LE control and sequencing   PATIENT EDUCATION:  Education details: Pt educated throughout session about proper posture and technique with exercises. Improved exercise technique, movement at target joints, use of target muscles after min to mod verbal, visual, tactile cues.  Person  educated: Patient Education method: Explanation, Demonstration, Tactile cues, and Verbal cues Education comprehension: verbalized understanding, returned demonstration, verbal cues required, tactile cues required, and needs further education  HOME EXERCISE PROGRAM: Access Code: [email or web address] URL: [email or web address] Date: 10/16/2022 Prepared by: Precious Bard Exercises - Standing Hip Flexor Stretch  - 1 x daily - 7 x weekly - 2 sets - 2 reps - 60 hold   Reviewed from last episode of care- will add to HEP as appropriate.   ASSESSMENT:  CLINICAL IMPRESSION:  Patient continues to progress with lower extremity strengthening exercises.  Focused on floor to stand transfers with good progress. Patient will continue to benefit from skilled therapy to address remaining deficits in order to improve quality of life and return to PLOF.    OBJECTIVE IMPAIRMENTS: Abnormal gait, decreased activity tolerance, decreased balance, decreased endurance, decreased mobility, difficulty walking, decreased ROM, decreased strength, and pain.   ACTIVITY LIMITATIONS: carrying, lifting, bending, sitting, standing, squatting, stairs, transfers, and bed mobility  PARTICIPATION LIMITATIONS: cleaning, laundry, shopping, community activity, and yard work  PERSONAL FACTORS: Time since onset of injury/illness/exacerbation and 1-2 comorbidities: HTN, Arthritis, past cervical ACDF procedure in 2021  are also affecting patient's functional outcome.   REHAB POTENTIAL: Good  CLINICAL DECISION MAKING: Stable/uncomplicated  EVALUATION COMPLEXITY: Moderate   GOALS: Goals reviewed with patient? Yes  SHORT TERM GOALS: Target date: 08/26/2022  Pt. Independent with HEP to increase B LE strength 1/2 muscle grade to improve standing/ independence with gait.  Baseline: See chart in objective, several increases but some remain the same  Goal status: IN PROGRESS  2.  Pt will decrease worst back pain as reported on NPRS by  at least 2 points in order to demonstrate clinically significant reduction in back pain.  Baseline: EVAL= up to 8/10 at worst 08/14/22: 3/10 at worst  Goal status: MET   LONG TERM GOALS: Target date: 12/30/2022  Pt will  improve FOTO to target score  of  60 or greater to display perceived improvements in ability to complete ADL's.  Baseline: EVAL= 47 5/8: 56 6/17: 56%; 10/07/2022= Did not administer today as patient was just assessed 2 weeks ago-  Goal status: IN PROGRESS  2.  Pt will decrease MODI score by at least 8 points in order demonstrate clinically significant reduction in back pain/disability.  Baseline: EVAL= 18 5/8: 10 6/17: 9/50  Goal status: MET  3.  Pt will decrease worst back pain as reported on NPRS by at least 3 points in order to demonstrate clinically significant reduction in back pain.  Baseline: EVAL= Up to 8/10 low back pain 08/14/22: 4/10 worst after work 6/1: 4/10; 10/07/2022- up to 5/10 after working 3 half days in a row.  Goal status: IN PROGRESS  4.  Pt will improve FGA by at least 3 points in order to demonstrate clinically significant improvement in balance.   Baseline: EVAL: to be initiated next visit; 07/17/2022= 18/30 08/14/22:19/30 6/17:  20/30 Goal status: IN PROGRESS  5.  Pt will decrease 5TSTS by at least 3 seconds in order to demonstrate clinically significant improvement in LE strength Baseline: EVAL= 19.23 sec  08/14/22:14.38 sec Goal status: MET  6.  Pt will increase by at least 0.13 m/s in order to demonstrate clinically significant improvement in community ambulation.   Baseline: EVAL= 0.65 m/s without UE support; 10/07/2022= 0.71 m/s Goal status: PROGRESSING   7. Patient will improved 6 min walk test > 1100 feet  consistently for improved gait efficiency and improved overall gait endurance with community activities.    Baseline: 07/17/2022= 1050 feet with cane 5/8: 1230 ft no AD; 10/07/2022= 950 feet    Goal status: REVISED   8. Patient will  report/demonstrate independence with all car transfers/tub transfers and floor transfers to maximize his functional independence in home/community.    Baseline: 10/07/2022= Patient reports difficulty due    To pain/weakness at times with transfers   Goal status: INITIAL  PLAN:  PT FREQUENCY: 1-2x/week  PT DURATION: 12 weeks  PLANNED INTERVENTIONS: Therapeutic exercises, Therapeutic activity, Neuromuscular re-education, Balance training, Gait training, Patient/Family education, Self Care, Joint mobilization, Joint manipulation, Stair training, Vestibular training, Canalith repositioning, Orthotic/Fit training, DME instructions, Dry Needling, Spinal manipulation, Spinal mobilization, Cryotherapy, Moist heat, Taping, and Manual therapy.  PLAN FOR NEXT SESSION:  Continue with balance, Therex, and dynamic gait training    Norman Herrlich PT ,DPT Physical Therapist- [place]  [facility]     5:14 PM 10/30/22

## 2022-11-04 ENCOUNTER — Ambulatory Visit: Payer: Medicare Other | Admitting: Physical Therapy

## 2022-11-04 DIAGNOSIS — G8929 Other chronic pain: Secondary | ICD-10-CM | POA: Diagnosis not present

## 2022-11-04 DIAGNOSIS — R262 Difficulty in walking, not elsewhere classified: Secondary | ICD-10-CM

## 2022-11-04 DIAGNOSIS — R2681 Unsteadiness on feet: Secondary | ICD-10-CM

## 2022-11-04 DIAGNOSIS — R278 Other lack of coordination: Secondary | ICD-10-CM | POA: Diagnosis not present

## 2022-11-04 DIAGNOSIS — R269 Unspecified abnormalities of gait and mobility: Secondary | ICD-10-CM

## 2022-11-04 DIAGNOSIS — M6281 Muscle weakness (generalized): Secondary | ICD-10-CM | POA: Diagnosis not present

## 2022-11-04 DIAGNOSIS — M2560 Stiffness of unspecified joint, not elsewhere classified: Secondary | ICD-10-CM | POA: Diagnosis not present

## 2022-11-04 DIAGNOSIS — M545 Low back pain, unspecified: Secondary | ICD-10-CM | POA: Diagnosis not present

## 2022-11-04 NOTE — Therapy (Signed)
OUTPATIENT PHYSICAL THERAPY THORACOLUMBAR TREATMENT  Patient Name: Larry Blair MRN: [id number] DOB:12-31-1955, 67 y.o., male Today's Date: 11/04/2022  END OF SESSION:  PT End of Session - 11/04/22 1316     Visit Number 29    Number of Visits 48    Date for PT Re-Evaluation 12/30/22    Authorization Type BCBS Medicare    Progress Note Due on Visit 30    PT Start Time 1316    PT Stop Time 1357    PT Time Calculation (min) 41 min    Equipment Utilized During Treatment Gait belt    Activity Tolerance Patient tolerated treatment well;No increased pain    Behavior During Therapy WFL for tasks assessed/performed               Past Medical History:  Diagnosis Date   Allergy    Arthritis    right shoulder   Asthma    Eczema    HTN (hypertension)    Hyperlipidemia    Substance abuse (HCC) 1976   daily   Past Surgical History:  Procedure Laterality Date   ANTERIOR CERVICAL DECOMP/DISCECTOMY FUSION N/A 08/30/2019   Procedure: CERVICAL THREE-FOUR ANTERIOR CERVICAL DECOMPRESSION/FUSION;  Surgeon: Tia Alert, MD;  Location: [facility] OR;  Service: Neurosurgery;  Laterality: N/A;   COLONOSCOPY     KNEE ARTHROSCOPY Left    Patient Active Problem List   Diagnosis Date Noted   Encounter for general adult medical examination with abnormal findings 07/04/2022   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 Sanda Linger  REFERRING PROVIDER: Dr. Sanda Linger  REFERRING DIAG: Chronic bilateral Low back pain without Sciatica  Rationale for  Evaluation and Treatment: Rehabilitation  THERAPY DIAG:  Muscle weakness (generalized)  Unsteadiness on feet  Difficulty in walking, not elsewhere classified  Chronic bilateral low back pain without sciatica  Abnormality of gait and mobility  Other lack of coordination  Difficulty in walking  Joint stiffness  ONSET DATE: 08/27/2022  SUBJECTIVE:                                                                                                                                                                                           SUBJECTIVE STATEMENT:  Pt says he is doing well today. Mild L flank pain under shoulder blade from moving battery from car at work over the weekend. Was able to mow part of the lawn  over the weekend.   PERTINENT HISTORY:  Fall with neck surgery - started it all - damaged vertebrae in my neck- 2021- s/p ACDF procedure, remote knee scope.   PAIN:  Are you having pain? No  PRECAUTIONS: Fall  WEIGHT BEARING RESTRICTIONS: No  FALLS:  Has patient fallen in last 6 months? No  PATIENT GOALS: Patient reports he wants to be able to walk again without a cane if possible.   NEXT MD VISIT: No follow up  OBJECTIVE:   Light touch: Impaired    FUNCTIONAL TESTS:  5 times sit to stand: 19.23 sec without UE support 6 minute walk test: To be evaluated next visit 10 meter walk test: 15.11 and 15.49 sec without an UE support = 15.3 sec or 0.65 m/s Berg Balance Scale: To be evaluated next visit   TODAY'S TREATMENT:  11/04/22   Nustep level reciprocal movement training and AAROM to improve extension, BUE/BLE   Level 1 x 1 min  level 2 x 1 min  Level 4 x 2 min  Leve1 x 1 min cool down   Performed with UE support: Hip flexion stretch 2 x 30 sec buil  Lateral lunge with 2 sec hold over bolster x 6 bil   Without UE support step up/down from 6inch step x 6 bil. Mild trunkal sway R and L due to poor deep core activation.  Forward/reverse gait 70ft  x5  with cues for posture and step length, mild L bias and trunkal sway for full step length.   Seated:  Trunk rotation with extended BUE and RTB x 12 bil  Modified crunch in arm chair sitting on edge of seat.   Standing UE diagonals with 3KG medicine ball x 15 bil.  Sit<>stand from lowered mat table x 12 with cues for full hip extension on each bout.   Qped: UE flexion x12 bil  Hip extension x 8 bil  Instruction from PT for TrA activation and to maintain neutral spine and prevent excessive lordosis.     PATIENT EDUCATION:  Education details: Pt educated throughout session about proper posture and technique with exercises. Improved exercise technique, movement at target joints, use of target muscles after min to mod verbal, visual, tactile cues.  Person educated: Patient Education method: Explanation, Demonstration, Tactile cues, and Verbal cues Education comprehension: verbalized understanding, returned demonstration, verbal cues required, tactile cues required, and needs further education  HOME EXERCISE PROGRAM: Access Code: [email or web address] URL: [email or web address] Date: 10/16/2022 Prepared by: Precious Bard Exercises - Standing Hip Flexor Stretch  - 1 x daily - 7 x weekly - 2 sets - 2 reps - 60 hold   Reviewed from last episode of care- will add to HEP as appropriate.   ASSESSMENT:  CLINICAL IMPRESSION:  Patient continues to progress with lower extremity strengthening exercises.  Pt demonstrates improved hip extension with gait throughout session. PT treatment focused on dynamic balance and deep core activation with standing, seated and Qped movements. No pain reported by pt throughout session in L flank and no noted tenderness to palpation with STM. Patient will continue to benefit from skilled therapy to address remaining deficits in order to improve quality of life and return to PLOF.    OBJECTIVE IMPAIRMENTS: Abnormal gait, decreased activity tolerance, decreased balance,  decreased endurance, decreased mobility, difficulty walking,  decreased ROM, decreased strength, and pain.   ACTIVITY LIMITATIONS: carrying, lifting, bending, sitting, standing, squatting, stairs, transfers, and bed mobility  PARTICIPATION LIMITATIONS: cleaning, laundry, shopping, community activity, and yard work  PERSONAL FACTORS: Time since onset of injury/illness/exacerbation and 1-2 comorbidities: HTN, Arthritis, past cervical ACDF procedure in 2021  are also affecting patient's functional outcome.   REHAB POTENTIAL: Good  CLINICAL DECISION MAKING: Stable/uncomplicated  EVALUATION COMPLEXITY: Moderate   GOALS: Goals reviewed with patient? Yes  SHORT TERM GOALS: Target date: 08/26/2022  Pt. Independent with HEP to increase B LE strength 1/2 muscle grade to improve standing/ independence with gait.  Baseline: See chart in objective, several increases but some remain the same  Goal status: IN PROGRESS  2.  Pt will decrease worst back pain as reported on NPRS by at least 2 points in order to demonstrate clinically significant reduction in back pain.  Baseline: EVAL= up to 8/10 at worst 08/14/22: 3/10 at worst  Goal status: MET   LONG TERM GOALS: Target date: 12/30/2022  Pt will improve FOTO to target score  of  60 or greater to display perceived improvements in ability to complete ADL's.  Baseline: EVAL= 47 5/8: 56 6/17: 56%; 10/07/2022= Did not administer today as patient was just assessed 2 weeks ago-  Goal status: IN PROGRESS  2.  Pt will decrease MODI score by at least 8 points in order demonstrate clinically significant reduction in back pain/disability.  Baseline: EVAL= 18 5/8: 10 6/17: 9/50  Goal status: MET  3.  Pt will decrease worst back pain as reported on NPRS by at least 3 points in order to demonstrate clinically significant reduction in back pain.  Baseline: EVAL= Up to 8/10 low back pain 08/14/22: 4/10 worst after work 6/1: 4/10; 10/07/2022- up to 5/10 after working 3  half days in a row.  Goal status: IN PROGRESS  4.  Pt will improve FGA by at least 3 points in order to demonstrate clinically significant improvement in balance.   Baseline: EVAL: to be initiated next visit; 07/17/2022= 18/30 08/14/22:19/30 6/17:  20/30 Goal status: IN PROGRESS  5.  Pt will decrease 5TSTS by at least 3 seconds in order to demonstrate clinically significant improvement in LE strength Baseline: EVAL= 19.23 sec  08/14/22:14.38 sec Goal status: MET  6.  Pt will increase by at least 0.13 m/s in order to demonstrate clinically significant improvement in community ambulation.   Baseline: EVAL= 0.65 m/s without UE support; 10/07/2022= 0.71 m/s Goal status: PROGRESSING   7. Patient will improved 6 min walk test > 1100 feet  consistently for improved gait efficiency and improved overall gait endurance with community activities.    Baseline: 07/17/2022= 1050 feet with cane 5/8: 1230 ft no AD; 10/07/2022= 950 feet    Goal status: REVISED   8. Patient will report/demonstrate independence with all car transfers/tub transfers and floor transfers to maximize his functional independence in home/community.    Baseline: 10/07/2022= Patient reports difficulty due    To pain/weakness at times with transfers   Goal status: INITIAL  PLAN:  PT FREQUENCY: 1-2x/week  PT DURATION: 12 weeks  PLANNED INTERVENTIONS: Therapeutic exercises, Therapeutic activity, Neuromuscular re-education, Balance training, Gait training, Patient/Family education, Self Care, Joint mobilization, Joint manipulation, Stair training, Vestibular training, Canalith repositioning, Orthotic/Fit training, DME instructions, Dry Needling, Spinal manipulation, Spinal mobilization, Cryotherapy, Moist heat, Taping, and Manual therapy.  PLAN FOR NEXT SESSION:   Continue with balance, Therex, and dynamic gait training    Genelle Bal  Mingo Amber ,DPT Physical Therapist- [facility]     2:01  PM 11/04/22

## 2022-11-05 ENCOUNTER — Ambulatory Visit: Payer: Medicare Other

## 2022-11-06 ENCOUNTER — Ambulatory Visit: Payer: Medicare Other | Admitting: Physical Therapy

## 2022-11-06 DIAGNOSIS — M2560 Stiffness of unspecified joint, not elsewhere classified: Secondary | ICD-10-CM | POA: Diagnosis not present

## 2022-11-06 DIAGNOSIS — M545 Low back pain, unspecified: Secondary | ICD-10-CM | POA: Diagnosis not present

## 2022-11-06 DIAGNOSIS — M6281 Muscle weakness (generalized): Secondary | ICD-10-CM | POA: Diagnosis not present

## 2022-11-06 DIAGNOSIS — R262 Difficulty in walking, not elsewhere classified: Secondary | ICD-10-CM | POA: Diagnosis not present

## 2022-11-06 DIAGNOSIS — R2681 Unsteadiness on feet: Secondary | ICD-10-CM

## 2022-11-06 DIAGNOSIS — R278 Other lack of coordination: Secondary | ICD-10-CM

## 2022-11-06 DIAGNOSIS — R269 Unspecified abnormalities of gait and mobility: Secondary | ICD-10-CM

## 2022-11-06 DIAGNOSIS — G8929 Other chronic pain: Secondary | ICD-10-CM

## 2022-11-06 NOTE — Therapy (Signed)
OUTPATIENT PHYSICAL THERAPY THORACOLUMBAR TREATMENT/  PHYSICAL THERAPY PROGRESS NOTE   Dates of reporting period  09/23/22   to   11/06/2022    Patient Name: Larry Blair MRN: [id number] DOB:08-06-1955, 67 y.o., male Today's Date: 11/06/2022  END OF SESSION:  PT End of Session - 11/06/22 1128     Visit Number 30    Number of Visits 48    Date for PT Re-Evaluation 12/30/22    Authorization Type BCBS Medicare    Progress Note Due on Visit 30    PT Start Time 1100    PT Stop Time 1145    PT Time Calculation (min) 45 min    Equipment Utilized During Treatment Gait belt    Activity Tolerance Patient tolerated treatment well;No increased pain    Behavior During Therapy WFL for tasks assessed/performed               Past Medical History:  Diagnosis Date   Allergy    Arthritis    right shoulder   Asthma    Eczema    HTN (hypertension)    Hyperlipidemia    Substance abuse (HCC) 1976   daily   Past Surgical History:  Procedure Laterality Date   ANTERIOR CERVICAL DECOMP/DISCECTOMY FUSION N/A 08/30/2019   Procedure: CERVICAL THREE-FOUR ANTERIOR CERVICAL DECOMPRESSION/FUSION;  Surgeon: Tia Alert, MD;  Location: [facility] OR;  Service: Neurosurgery;  Laterality: N/A;   COLONOSCOPY     KNEE ARTHROSCOPY Left    Patient Active Problem List   Diagnosis Date Noted   Encounter for general adult medical examination with abnormal findings 07/04/2022   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 Sanda Linger  REFERRING PROVIDER: Dr.  Sanda Linger  REFERRING DIAG: Chronic bilateral Low back pain without Sciatica  Rationale for Evaluation and Treatment: Rehabilitation  THERAPY DIAG:  Muscle weakness (generalized)  Unsteadiness on feet  Difficulty in walking, not elsewhere classified  Difficulty in walking  Chronic bilateral low back pain without sciatica  Abnormality of gait and mobility  Other lack of coordination  Joint stiffness  ONSET DATE: 08/27/2022  SUBJECTIVE:  SUBJECTIVE STATEMENT:   Pt says he is sore and stiff today with pain in the SI region on the R side.    PERTINENT HISTORY:  Fall with neck surgery - started it all - damaged vertebrae in my neck- 2021- s/p ACDF procedure, remote knee scope.   PAIN:  Are you having pain? No  PRECAUTIONS: Fall  WEIGHT BEARING RESTRICTIONS: No  FALLS:  Has patient fallen in last 6 months? No  PATIENT GOALS: Patient reports he wants to be able to walk again without a cane if possible.   NEXT MD VISIT: No follow up  OBJECTIVE:   Light touch: Impaired    FUNCTIONAL TESTS:  5 times sit to stand: 19.23 sec without UE support 6 minute walk test: To be evaluated next visit 10 meter walk test: 15.11 and 15.49 sec without an UE support = 15.3 sec or 0.65 m/s Berg Balance Scale: To be evaluated next visit   TODAY'S TREATMENT:  11/06/22   Nustep level reciprocal movement training and AAROM to improve extension, BUE/BLE   Level 2 x 1 min  level 4 x 2 min  Level 2 x 1 min   Gait through rehab gym with SPC x 102ft, 109ft and 81ft. Last 2 bouts noted to have reduced stiffness and increased ROM in bil knees and hips.   Bridge with 2 sec hold x 12 Sidelying clamshell x 12 bil  Hip abduction x 12 bil in sidelying Sit<>stand x 10 without UE support  LTR x 15 bil with 5  sec hold bil LE raise x 12 for care activation, knees at 90 deg,   Manual: HS stretch with contract relax 20 sec hold, 5 sec contract for 3 bouts.  Piriforis stretch x 1 min bil  Hip flexion stretch x 1 min bil  Hip adductor stretch x 1 min bil  Single knee to chest x 1 min bil   Pt reports decreased stiffness in BLE and decrease pain in the R lowback/SI region upon completion of PT treatment.     PATIENT EDUCATION:  Education details: Pt educated throughout session about proper posture and technique with exercises. Improved exercise technique, movement at target joints, use of target muscles after min to mod verbal, visual, tactile cues.  Person educated: Patient Education method: Explanation, Demonstration, Tactile cues, and Verbal cues Education comprehension: verbalized understanding, returned demonstration, verbal cues required, tactile cues required, and needs further education  HOME EXERCISE PROGRAM: Access Code: [email or web address] URL: [email or web address] Date: 10/16/2022 Prepared by: Precious Bard Exercises - Standing Hip Flexor Stretch  - 1 x daily - 7 x weekly - 2 sets - 2 reps - 60 hold   Reviewed from last episode of care- will add to HEP as appropriate.   ASSESSMENT:  CLINICAL IMPRESSION:  Patient reporting increased stiffness in BLE and low back pain on this day, limiting ability for PT to perform assessment of LTG. Throughout session pt demonstrates improved ROM AND decreased pain in the low back. Will need to perform formal assessment of LTG at next PT treatment.  Patient will continue to benefit from skilled therapy to address remaining deficits in order to improve quality of life and return to PLOF.    OBJECTIVE IMPAIRMENTS: Abnormal gait, decreased activity tolerance, decreased balance, decreased endurance, decreased mobility, difficulty walking, decreased ROM, decreased strength, and pain.   ACTIVITY LIMITATIONS: carrying, lifting, bending, sitting, standing,  squatting, stairs, transfers, and bed mobility  PARTICIPATION LIMITATIONS: cleaning, laundry, shopping, community activity, and yard work  PERSONAL  FACTORS: Time since onset of injury/illness/exacerbation and 1-2 comorbidities: HTN, Arthritis, past cervical ACDF procedure in 2021  are also affecting patient's functional outcome.   REHAB POTENTIAL: Good  CLINICAL DECISION MAKING: Stable/uncomplicated  EVALUATION COMPLEXITY: Moderate   GOALS: Goals reviewed with patient? Yes  SHORT TERM GOALS: Target date: 08/26/2022  Pt. Independent with HEP to increase B LE strength 1/2 muscle grade to improve standing/ independence with gait.  Baseline: See chart in objective, several increases but some remain the same  Goal status: IN PROGRESS  2.  Pt will decrease worst back pain as reported on NPRS by at least 2 points in order to demonstrate clinically significant reduction in back pain.  Baseline: EVAL= up to 8/10 at worst 08/14/22: 3/10 at worst  Goal status: MET   LONG TERM GOALS: Target date: 12/30/2022  Pt will improve FOTO to target score  of  60 or greater to display perceived improvements in ability to complete ADL's.  Baseline: EVAL= 47 5/8: 56 6/17: 56%; 10/07/2022= Did not administer today as patient was just assessed 2 weeks ago-  Goal status: IN PROGRESS  2.  Pt will decrease MODI score by at least 8 points in order demonstrate clinically significant reduction in back pain/disability.  Baseline: EVAL= 18 5/8: 10 6/17: 9/50  Goal status: MET  3.  Pt will decrease worst back pain as reported on NPRS by at least 3 points in order to demonstrate clinically significant reduction in back pain.  Baseline: EVAL= Up to 8/10 low back pain 08/14/22: 4/10 worst after work 6/1: 4/10; 10/07/2022- up to 5/10 after working 3 half days in a row.  Goal status: IN PROGRESS  4.  Pt will improve FGA by at least 3 points in order to demonstrate clinically significant improvement in balance.   Baseline:  EVAL: to be initiated next visit; 07/17/2022= 18/30 08/14/22:19/30 6/17:  20/30 Goal status: IN PROGRESS  5.  Pt will decrease 5TSTS by at least 3 seconds in order to demonstrate clinically significant improvement in LE strength Baseline: EVAL= 19.23 sec  08/14/22:14.38 sec Goal status: MET  6.  Pt will increase by at least 0.13 m/s in order to demonstrate clinically significant improvement in community ambulation.   Baseline: EVAL= 0.65 m/s without UE support; 10/07/2022= 0.71 m/s Goal status: PROGRESSING   7. Patient will improved 6 min walk test > 1100 feet  consistently for improved gait efficiency and improved overall gait endurance with community activities.    Baseline: 07/17/2022= 1050 feet with cane 5/8: 1230 ft no AD; 10/07/2022= 950 feet    Goal status: REVISED   8. Patient will report/demonstrate independence with all car transfers/tub transfers and floor transfers to maximize his functional independence in home/community.    Baseline: 10/07/2022= Patient reports difficulty due    To pain/weakness at times with transfers   Goal status: INITIAL  PLAN:  PT FREQUENCY: 1-2x/week  PT DURATION: 12 weeks  PLANNED INTERVENTIONS: Therapeutic exercises, Therapeutic activity, Neuromuscular re-education, Balance training, Gait training, Patient/Family education, Self Care, Joint mobilization, Joint manipulation, Stair training, Vestibular training, Canalith repositioning, Orthotic/Fit training, DME instructions, Dry Needling, Spinal manipulation, Spinal mobilization, Cryotherapy, Moist heat, Taping, and Manual therapy.  PLAN FOR NEXT SESSION:   Perform GOAL assessment. Modify HEP as needed.   Golden Pop PT ,DPT Physical Therapist- [facility]     11:29 AM 11/06/22

## 2022-11-11 ENCOUNTER — Ambulatory Visit: Payer: Medicare Other | Admitting: Physical Therapy

## 2022-11-12 ENCOUNTER — Ambulatory Visit: Payer: Medicare Other

## 2022-11-12 VITALS — Ht 71.0 in | Wt 186.0 lb

## 2022-11-12 DIAGNOSIS — Z0000 Encounter for general adult medical examination without abnormal findings: Secondary | ICD-10-CM | POA: Diagnosis not present

## 2022-11-12 NOTE — Progress Notes (Addendum)
Subjective:   Larry Blair is a 67 y.o. male who presents for an Initial Medicare Annual Wellness Visit.  Visit Complete: Virtual  I connected with  Larry Blair on 11/12/22 by a audio enabled telemedicine application and verified that I am speaking with the correct person using two identifiers.  Patient Location: Home  Provider Location: Office/Clinic  I discussed the limitations of evaluation and management by telemedicine. The patient expressed understanding and agreed to proceed.  Vital Signs: Patient was unable to self-report vital signs via telehealth due to a lack of equipment at home.   Review of Systems     Cardiac Risk Factors include: advanced age (>69men, >36 women);dyslipidemia;family history of premature cardiovascular disease;hypertension;male gender     Objective:    Today's Vitals   11/12/22 1109  Weight: 186 lb (84.4 kg)  Height: 5\' 11"  (1.803 m)  PainSc: 0-No pain   Body mass index is 25.94 kg/m.     11/12/2022   11:22 AM 07/15/2022    3:33 PM 08/26/2019    8:59 AM 09/22/2017    8:00 AM  Advanced Directives  Does Patient Have a Medical Advance Directive? No No No No  Would patient like information on creating a medical advance directive? No - Patient declined No - Patient declined  No - Patient declined    Current Medications (verified) Outpatient Encounter Medications as of 11/12/2022  Medication Sig   acetaminophen (TYLENOL) 650 MG CR tablet Take 1,300 mg by mouth every 8 (eight) hours as needed for pain.   albuterol (VENTOLIN HFA) 108 (90 Base) MCG/ACT inhaler Inhale 2 puffs into the lungs every 6 (six) hours as needed for wheezing or shortness of breath.   atorvastatin (LIPITOR) 10 MG tablet Take 1 tablet (10 mg total) by mouth daily.   bisacodyl (DULCOLAX) 5 MG EC tablet Take 5 mg by mouth See admin instructions. Take 5 mg daily, may take a second 5 mg dose as needed for constipation   Budeson-Glycopyrrol-Formoterol (BREZTRI AEROSPHERE)  160-9-4.8 MCG/ACT AERO INHALE 2 PUFFS INTO THE LUNGS TWICE DAILY   carvedilol (COREG) 6.25 MG tablet Take 1 tablet (6.25 mg total) by mouth 2 (two) times daily with a meal.   fexofenadine (ALLEGRA) 180 MG tablet Take 180 mg by mouth daily.   fluocinonide cream (LIDEX) 0.05 % APPLY  CREAM EXTERNALLY TWICE DAILY   indapamide (LOZOL) 1.25 MG tablet Take 1 tablet (1.25 mg total) by mouth daily.   Multiple Vitamin (MULTIVITAMIN WITH MINERALS) TABS tablet Take 1 tablet by mouth daily. Centrum Silver   No facility-administered encounter medications on file as of 11/12/2022.    Allergies (verified) Elemental sulfur   History: Past Medical History:  Diagnosis Date   Allergy    Arthritis    right shoulder   Asthma    Eczema    HTN (hypertension)    Hyperlipidemia    Substance abuse (HCC) 1976   daily   Past Surgical History:  Procedure Laterality Date   ANTERIOR CERVICAL DECOMP/DISCECTOMY FUSION N/A 08/30/2019   Procedure: CERVICAL THREE-FOUR ANTERIOR CERVICAL DECOMPRESSION/FUSION;  Surgeon: Tia Alert, MD;  Location: [facility];  Service: Neurosurgery;  Laterality: N/A;   COLONOSCOPY     KNEE ARTHROSCOPY Left    Family History  Problem Relation Age of Onset   Colon cancer Father    Early death Brother    Kidney failure Brother    Cancer Maternal Grandmother    Cancer Maternal Grandfather    Early death Sister  Kidney failure Sister    Tongue cancer Maternal Aunt    Pancreatic cancer Maternal Aunt    Colon polyps Neg Hx    Esophageal cancer Neg Hx    Stomach cancer Neg Hx    Rectal cancer Neg Hx    Social History   Socioeconomic History   Marital status: Divorced    Spouse name: Not on file   Number of children: Not on file   Years of education: Not on file   Highest education level: Not on file  Occupational History   Not on file  Tobacco Use   Smoking status: Former    Current packs/day: 0.00    Types: Cigarettes, Cigars    Quit date: 06/30/2012    Years since  quitting: 10.3   Smokeless tobacco: Former    Types: Chew    Quit date: 07/01/1987  Vaping Use   Vaping status: Never Used  Substance and Sexual Activity   Alcohol use: Yes    Alcohol/week: 4.0 standard drinks of alcohol    Types: 4 Shots of liquor per week    Comment: + occasional beer   Drug use: Yes    Frequency: 7.0 times per week    Types: Marijuana    Comment: "several times a day"   Sexual activity: Yes    Partners: Female  Other Topics Concern   Not on file  Social History Narrative   Lives with spouse    Right hand   Social Determinants of Health   Financial Resource Strain: Low Risk  (11/12/2022)   Overall Financial Resource Strain (CARDIA)    Difficulty of Paying Living Expenses: Not hard at all  Food Insecurity: No Food Insecurity (11/12/2022)   Hunger Vital Sign    Worried About Blair Out of Food in the Last Year: Never true    Ran Out of Food in the Last Year: Never true  Transportation Needs: No Transportation Needs (11/12/2022)   PRAPARE - Administrator, Civil Service (Medical): No    Lack of Transportation (Non-Medical): No  Physical Activity: Sufficiently Active (11/12/2022)   Exercise Vital Sign    Days of Exercise per Week: 5 days    Minutes of Exercise per Session: 30 min  Stress: No Stress Concern Present (11/12/2022)   [employer] of Occupational Health - Occupational Stress Questionnaire    Feeling of Stress : Not at all  Social Connections: Socially Integrated (11/12/2022)   Social Connection and Isolation Panel [NHANES]    Frequency of Communication with Friends and Family: More than three times a week    Frequency of Social Gatherings with Friends and Family: More than three times a week    Attends Religious Services: More than 4 times per year    Active Member of [employer] or Organizations: Yes    Attends Engineer, structural: More than 4 times per year    Marital Status: Married    Tobacco Counseling Counseling given: Not  Answered   Clinical Intake:  Pre-visit preparation completed: Yes  Pain : No/denies pain Pain Score: 0-No pain     BMI - recorded: 25.94 Nutritional Status: BMI 25 -29 Overweight Nutritional Risks: None Diabetes: No  How often do you need to have someone help you when you read instructions, pamphlets, or other written materials from your doctor or pharmacy?: 1 - Never What is the last grade level you completed in school?: HSG  Interpreter Needed?: No  Information entered by :: Judeen Geralds N.  Zahria Ding, LPN.   Activities of Daily Living    11/12/2022   11:26 AM  In your present state of health, do you have any difficulty performing the following activities:  Hearing? 0  Vision? 0  Difficulty concentrating or making decisions? 0  Walking or climbing stairs? 1  Dressing or bathing? 0  Doing errands, shopping? 0  Preparing Food and eating ? N  Using the Toilet? N  In the past six months, have you accidently leaked urine? N  Do you have problems with loss of bowel control? N  Managing your Medications? N  Managing your Finances? N  Housekeeping or managing your Housekeeping? N    Patient Care Team: Etta Grandchild, MD as PCP - General (Internal Medicine)  Indicate any recent Medical Services you may have received from other than [facility] providers in the past year (date may be approximate).     Assessment:   This is a routine wellness examination for Berton.  Hearing/Vision screen Hearing Screening - Comments:: Denies hearing difficulties.  Vision Screening - Comments:: Wears rx glasses - up to date with routine eye exams with [facility] Kateri [facility]um[facility]   Dietary issues and exercise activities discussed:     Goals Addressed   None   Depression Screen    11/12/2022   11:24 AM 07/04/2022    2:17 PM 10/03/2020    1:57 PM 01/04/2020    3:01 PM 08/26/2019    9:00 AM 07/12/2019    1:55 PM 06/30/2017    2:59 PM  PHQ 2/9 Scores  PHQ - 2 Score 0 0 0 0 0 0 0  PHQ- 9 Score  0 0         Fall Risk    11/12/2022   11:23 AM 07/04/2022    2:18 PM 10/03/2020    1:57 PM 01/04/2020    3:01 PM 08/26/2019    8:58 AM  Fall Risk   Falls in the past year? 0 0 1 1 1   Number falls in past yr: 0 0 0 0 1  Injury with Fall? 0 0 1  0  Comment   scrape    Risk for fall due to : No Fall Risks No Fall Risks Impaired balance/gait    Follow up Falls prevention discussed Falls evaluation completed       MEDICARE RISK AT HOME:  Medicare Risk at Home - 11/12/22 1122     Any stairs in or around the home? No    If so, are there any without handrails? No    Home free of loose throw rugs in walkways, pet beds, electrical cords, etc? Yes    Adequate lighting in your home to reduce risk of falls? Yes    Life alert? No    Use of a cane, walker or w/c? Yes    Grab bars in the bathroom? Yes    Shower chair or bench in shower? Yes    Elevated toilet seat or a handicapped toilet? Yes             TIMED UP AND GO:  Was the test performed? No    Cognitive Function:        11/12/2022   11:35 AM  6CIT Screen  What Year? 0 points  What month? 0 points  What time? 0 points  Count back from 20 0 points  Months in reverse 0 points  Repeat phrase 0 points  Total Score 0 points    Immunizations Immunization  History  Administered Date(s) Administered   Fluad Quad(high Dose 65+) 02/01/2019, 02/10/2020, 02/13/2021   Influenza,inj,Quad PF,6+ Mos 06/30/2017, 02/09/2018   PFIZER(Purple Top)SARS-COV-2 Vaccination 05/29/2019, 06/19/2019, 03/25/2020   Pfizer Covid-19 Vaccine Bivalent Booster 15yrs & up 02/02/2021, 08/16/2021   Pneumococcal Conjugate-13 08/23/2019   Pneumococcal Polysaccharide-23 09/22/2020   Tdap 06/30/2017   Zoster Recombinant(Shingrix) 09/22/2020, 08/16/2021    TDAP status: Up to date  Flu Vaccine status: Due, Education has been provided regarding the importance of this vaccine. Advised may receive this vaccine at local pharmacy or Health Dept. Aware to  provide a copy of the vaccination record if obtained from local pharmacy or Health Dept. Verbalized acceptance and understanding.  Pneumococcal vaccine status: Up to date  Covid-19 vaccine status: Completed vaccines  Qualifies for Shingles Vaccine? Yes   Zostavax completed No   Shingrix Completed?: Yes  Screening Tests Health Maintenance  Topic Date Due   COVID-19 Vaccine (6 - 2023-24 season) 12/07/2021   INFLUENZA VACCINE  11/07/2022   Medicare Annual Wellness (AWV)  11/12/2023   DTaP/Tdap/Td (2 - Td or Tdap) 07/01/2027   Colonoscopy  09/23/2027   Pneumonia Vaccine 47+ Years old  Completed   Hepatitis C Screening  Completed   Zoster Vaccines- Shingrix  Completed   HPV VACCINES  Aged Out    Health Maintenance  Health Maintenance Due  Topic Date Due   COVID-19 Vaccine (6 - 2023-24 season) 12/07/2021   INFLUENZA VACCINE  11/07/2022    Colorectal cancer screening: Type of screening: Colonoscopy. Completed 09/22/2017. Repeat every 5 years  Lung Cancer Screening: (Low Dose CT Chest recommended if Age 25-80 years, 20 pack-year currently smoking OR have quit w/in 15years.) does not qualify.   Lung Cancer Screening Referral: No  Additional Screening:  Hepatitis C Screening: does qualify; Completed 06/30/2017  Vision Screening: Recommended annual ophthalmology exams for early detection of glaucoma and other disorders of the eye. Is the patient up to date with their annual eye exam?  Yes  Who is the provider or what is the name of the office in which the patient attends annual eye exams? [facility] If pt is not established with a provider, would they like to be referred to a provider to establish care? No .   Dental Screening: Recommended annual dental exams for proper oral hygiene  Diabetic Foot Exam: N/A  Community Resource Referral / Chronic Care Management: CRR required this visit?  No   CCM required this visit?  No    Plan:     I have personally  reviewed and noted the following in the patient's chart:   Medical and social history Use of alcohol, tobacco or illicit drugs  Current medications and supplements including opioid prescriptions. Patient is not currently taking opioid prescriptions. Functional ability and status Nutritional status Physical activity Advanced directives List of other physicians Hospitalizations, surgeries, and ER visits in previous 12 months Vitals Screenings to include cognitive, depression, and falls Referrals and appointments  In addition, I have reviewed and discussed with patient certain preventive protocols, quality metrics, and best practice recommendations. A written personalized care plan for preventive services as well as general preventive health recommendations were provided to patient.     Mickeal Needy, LPN   0/05/7251   After Visit Summary: (Mail) Due to this being a telephonic visit, the after visit summary with patients personalized plan was offered to patient via mail   Nurse Notes: Normal cognitive status assessed by direct observation via telephone conversation by this Nurse  Health Advisor. No abnormalities found.

## 2022-11-12 NOTE — Patient Instructions (Addendum)
[name] , Thank you for taking time to come for your Medicare Wellness Visit. I appreciate your ongoing commitment to your health goals. Please review the following plan we discussed and let me know if I can assist you in the future.   Referrals/Orders/Follow-Ups/Clinician Recommendations: No  This is a list of the screening recommended for you and due dates:  Health Maintenance  Topic Date Due   COVID-19 Vaccine (6 - 2023-24 season) 12/07/2021   Flu Shot  11/07/2022   Medicare Annual Wellness Visit  11/12/2023   DTaP/Tdap/Td vaccine (2 - Td or Tdap) 07/01/2027   Colon Cancer Screening  09/23/2027   Pneumonia Vaccine  Completed   Hepatitis C Screening  Completed   Zoster (Shingles) Vaccine  Completed   HPV Vaccine  Aged Out    Advanced directives: (Declined) Advance directive discussed with you today. Even though you declined this today, please call our office should you change your mind, and we can give you the proper paperwork for you to fill out.  Next Medicare Annual Wellness Visit scheduled for next year: Yes  Preventive Care 46 Years and Older, Male  Preventive care refers to lifestyle choices and visits with your health care provider that can promote health and wellness. What does preventive care include? A yearly physical exam. This is also called an annual well check. Dental exams once or twice a year. Routine eye exams. Ask your health care provider how often you should have your eyes checked. Personal lifestyle choices, including: Daily care of your teeth and gums. Regular physical activity. Eating a healthy diet. Avoiding tobacco and drug use. Limiting alcohol use. Practicing safe sex. Taking low doses of aspirin every day. Taking vitamin and mineral supplements as recommended by your health care provider. What happens during an annual well check? The services and screenings done by your health care provider during your annual well check will depend on your  age, overall health, lifestyle risk factors, and family history of disease. Counseling  Your health care provider may ask you questions about your: Alcohol use. Tobacco use. Drug use. Emotional well-being. Home and relationship well-being. Sexual activity. Eating habits. History of falls. Memory and ability to understand (cognition). Work and work Astronomer. Screening  You may have the following tests or measurements: Height, weight, and BMI. Blood pressure. Lipid and cholesterol levels. These may be checked every 5 years, or more frequently if you are over 72 years old. Skin check. Lung cancer screening. You may have this screening every year starting at age 33 if you have a 30-pack-year history of smoking and currently smoke or have quit within the past 15 years. Fecal occult blood test (FOBT) of the stool. You may have this test every year starting at age 53. Flexible sigmoidoscopy or colonoscopy. You may have a sigmoidoscopy every 5 years or a colonoscopy every 10 years starting at age 69. Prostate cancer screening. Recommendations will vary depending on your family history and other risks. Hepatitis C blood test. Hepatitis B blood test. Sexually transmitted disease (STD) testing. Diabetes screening. This is done by checking your blood sugar (glucose) after you have not eaten for a while (fasting). You may have this done every 1-3 years. Abdominal aortic aneurysm (AAA) screening. You may need this if you are a current or former smoker. Osteoporosis. You may be screened starting at age 57 if you are at high risk. Talk with your health care provider about your test results, treatment options, and if necessary, the need for  more tests. Vaccines  Your health care provider may recommend certain vaccines, such as: Influenza vaccine. This is recommended every year. Tetanus, diphtheria, and acellular pertussis (Tdap, Td) vaccine. You may need a Td booster every 10 years. Zoster  vaccine. You may need this after age 87. Pneumococcal 13-valent conjugate (PCV13) vaccine. One dose is recommended after age 41. Pneumococcal polysaccharide (PPSV23) vaccine. One dose is recommended after age 61. Talk to your health care provider about which screenings and vaccines you need and how often you need them. This information is not intended to replace advice given to you by your health care provider. Make sure you discuss any questions you have with your health care provider. Document Released: 04/21/2015 Document Revised: 12/13/2015 Document Reviewed: 01/24/2015 Elsevier Interactive Patient Education  2017 [employer].  Fall Prevention in the Home Falls can cause injuries. They can happen to people of all ages. There are many things you can do to make your home safe and to help prevent falls. What can I do on the outside of my home? Regularly fix the edges of walkways and driveways and fix any cracks. Remove anything that might make you trip as you walk through a door, such as a raised step or threshold. Trim any bushes or trees on the path to your home. Use bright outdoor lighting. Clear any walking paths of anything that might make someone trip, such as rocks or tools. Regularly check to see if handrails are loose or broken. Make sure that both sides of any steps have handrails. Any raised decks and porches should have guardrails on the edges. Have any leaves, snow, or ice cleared regularly. Use sand or salt on walking paths during winter. Clean up any spills in your garage right away. This includes oil or grease spills. What can I do in the bathroom? Use night lights. Install grab bars by the toilet and in the tub and shower. Do not use towel bars as grab bars. Use non-skid mats or decals in the tub or shower. If you need to sit down in the shower, use a plastic, non-slip stool. Keep the floor dry. Clean up any water that spills on the floor as soon as it happens. Remove  soap buildup in the tub or shower regularly. Attach bath mats securely with double-sided non-slip rug tape. Do not have throw rugs and other things on the floor that can make you trip. What can I do in the bedroom? Use night lights. Make sure that you have a light by your bed that is easy to reach. Do not use any sheets or blankets that are too big for your bed. They should not hang down onto the floor. Have a firm chair that has side arms. You can use this for support while you get dressed. Do not have throw rugs and other things on the floor that can make you trip. What can I do in the kitchen? Clean up any spills right away. Avoid walking on wet floors. Keep items that you use a lot in easy-to-reach places. If you need to reach something above you, use a strong step stool that has a grab bar. Keep electrical cords out of the way. Do not use floor polish or wax that makes floors slippery. If you must use wax, use non-skid floor wax. Do not have throw rugs and other things on the floor that can make you trip. What can I do with my stairs? Do not leave any items on the stairs. Make  sure that there are handrails on both sides of the stairs and use them. Fix handrails that are broken or loose. Make sure that handrails are as long as the stairways. Check any carpeting to make sure that it is firmly attached to the stairs. Fix any carpet that is loose or worn. Avoid having throw rugs at the top or bottom of the stairs. If you do have throw rugs, attach them to the floor with carpet tape. Make sure that you have a light switch at the top of the stairs and the bottom of the stairs. If you do not have them, ask someone to add them for you. What else can I do to help prevent falls? Wear shoes that: Do not have high heels. Have rubber bottoms. Are comfortable and fit you well. Are closed at the toe. Do not wear sandals. If you use a stepladder: Make sure that it is fully opened. Do not climb a  closed stepladder. Make sure that both sides of the stepladder are locked into place. Ask someone to hold it for you, if possible. Clearly mark and make sure that you can see: Any grab bars or handrails. First and last steps. Where the edge of each step is. Use tools that help you move around (mobility aids) if they are needed. These include: Canes. Walkers. Scooters. Crutches. Turn on the lights when you go into a dark area. Replace any light bulbs as soon as they burn out. Set up your furniture so you have a clear path. Avoid moving your furniture around. If any of your floors are uneven, fix them. If there are any pets around you, be aware of where they are. Review your medicines with your doctor. Some medicines can make you feel dizzy. This can increase your chance of falling. Ask your doctor what other things that you can do to help prevent falls. This information is not intended to replace advice given to you by your health care provider. Make sure you discuss any questions you have with your health care provider. Document Released: 01/19/2009 Document Revised: 08/31/2015 Document Reviewed: 04/29/2014 Elsevier Interactive Patient Education  2017 [employer].

## 2022-11-13 ENCOUNTER — Ambulatory Visit: Payer: Medicare Other | Attending: Internal Medicine

## 2022-11-13 DIAGNOSIS — R278 Other lack of coordination: Secondary | ICD-10-CM | POA: Diagnosis not present

## 2022-11-13 DIAGNOSIS — R2681 Unsteadiness on feet: Secondary | ICD-10-CM

## 2022-11-13 DIAGNOSIS — G8929 Other chronic pain: Secondary | ICD-10-CM | POA: Insufficient documentation

## 2022-11-13 DIAGNOSIS — M6281 Muscle weakness (generalized): Secondary | ICD-10-CM

## 2022-11-13 DIAGNOSIS — R269 Unspecified abnormalities of gait and mobility: Secondary | ICD-10-CM | POA: Diagnosis not present

## 2022-11-13 DIAGNOSIS — M2560 Stiffness of unspecified joint, not elsewhere classified: Secondary | ICD-10-CM | POA: Diagnosis not present

## 2022-11-13 DIAGNOSIS — M545 Low back pain, unspecified: Secondary | ICD-10-CM | POA: Diagnosis not present

## 2022-11-13 DIAGNOSIS — R262 Difficulty in walking, not elsewhere classified: Secondary | ICD-10-CM | POA: Diagnosis not present

## 2022-11-13 NOTE — Therapy (Signed)
OUTPATIENT PHYSICAL THERAPY THORACOLUMBAR TREATMENT    Patient Name: Larry Blair MRN: [id number] DOB:[birth year], 67 y.o., male Today's Date: 11/13/2022  END OF SESSION:  PT End of Session - 11/13/22 1654     Visit Number 31    Number of Visits 48    Date for PT Re-Evaluation 12/30/22    Authorization Type BCBS Medicare    Progress Note Due on Visit 30    PT Start Time 1403    PT Stop Time 1445    PT Time Calculation (min) 42 min    Equipment Utilized During Treatment Gait belt    Activity Tolerance Patient tolerated treatment well;No increased pain    Behavior During Therapy WFL for tasks assessed/performed                Past Medical History:  Diagnosis Date   Allergy    Arthritis    right shoulder   Asthma    Eczema    HTN (hypertension)    Hyperlipidemia    Substance abuse (HCC) 1976   daily   Past Surgical History:  Procedure Laterality Date   ANTERIOR CERVICAL DECOMP/DISCECTOMY FUSION N/A 08/30/2019   Procedure: CERVICAL THREE-FOUR ANTERIOR CERVICAL DECOMPRESSION/FUSION;  Surgeon: Tia Alert, MD;  Location: [facility] OR;  Service: Neurosurgery;  Laterality: N/A;   COLONOSCOPY     KNEE ARTHROSCOPY Left    Patient Active Problem List   Diagnosis Date Noted   Encounter for general adult medical examination with abnormal findings 07/04/2022   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 Sanda Linger  REFERRING PROVIDER: Dr. Sanda Linger  REFERRING DIAG: Chronic bilateral Low back pain without Sciatica  Rationale  for Evaluation and Treatment: Rehabilitation  THERAPY DIAG:  Muscle weakness (generalized)  Unsteadiness on feet  Difficulty in walking, not elsewhere classified  Difficulty in walking  Chronic bilateral low back pain without sciatica  Abnormality of gait and mobility  Other lack of coordination  Joint stiffness  ONSET DATE: 08/27/2022  SUBJECTIVE:  SUBJECTIVE STATEMENT:   Pt denies any complications since the last visit and notes no pain today.  Pt did have some stiffness this morning, but is feeling better since being up and moving.   PERTINENT HISTORY:  Fall with neck surgery - started it all - damaged vertebrae in my neck- 2021- s/p ACDF procedure, remote knee scope.   PAIN:  Are you having pain? No  PRECAUTIONS: Fall  WEIGHT BEARING RESTRICTIONS: No  FALLS:  Has patient fallen in last 6 months? No  PATIENT GOALS: Patient reports he wants to be able to walk again without a cane if possible.   NEXT MD VISIT: No follow up  OBJECTIVE:   Light touch: Impaired    FUNCTIONAL TESTS:  5 times sit to stand: 19.23 sec without UE support 6 minute walk test: To be evaluated next visit 10 meter walk test: 15.11 and 15.49 sec without an UE support = 15.3 sec or 0.65 m/s Berg Balance Scale: To be evaluated next visit   TODAY'S TREATMENT:  11/13/22   TherEx  Nustep level reciprocal movement training and AAROM to improve extension, BUE/BLE   Level 2 x 1 min  level 4 x 2 min  Level 2 x 1 min   Gait through rehab gym with SPC x 70ft, 24ft and 16ft. Last 2 bouts noted to have reduced stiffness and increased ROM in bil knees and hips.   Bridge with 2 sec hold x10 Bridge with posterior pelvic tilt and lifting at each spinal segment throughout the bridging. Sidelying clamshell x 15 bil   Hip abduction x 15 bil in sidelying Prone donkey kicks, 2x15 each LE Prone press ups, 3 sec holds, x10 Supine dead bugs for core activation, x10 for first bout, modified after the first set to allow for pt to have feet on bed due to fatigue x15 LTR x 15 bil with 5 sec hold bil Sit<>stand x 10 without UE support       PATIENT EDUCATION:  Education details: Pt educated throughout session about proper posture and technique with exercises. Improved exercise technique, movement at target joints, use of target muscles after min to mod verbal, visual, tactile cues.  Person educated: Patient Education method: Explanation, Demonstration, Tactile cues, and Verbal cues Education comprehension: verbalized understanding, returned demonstration, verbal cues required, tactile cues required, and needs further education  HOME EXERCISE PROGRAM: Access Code: [email or web address] URL: [email or web address] Date: 10/16/2022 Prepared by: Precious Bard Exercises - Standing Hip Flexor Stretch  - 1 x daily - 7 x weekly - 2 sets - 2 reps - 60 hold   Reviewed from last episode of care- will add to HEP as appropriate.   ASSESSMENT:  CLINICAL IMPRESSION:   Pt responded well to the exercises and was introduced to more core stability tasks.  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  Pt assessed and has increased difficulty recruiting core for stability when bending over which is causing increased pain in the lumbar region.  Pt has difficulty with dead bugs, but progressively improved as time allowed.   Pt will continue to benefit from skilled therapy to address remaining deficits in order to improve overall QoL and return to PLOF.     OBJECTIVE IMPAIRMENTS: Abnormal gait, decreased activity tolerance, decreased balance, decreased endurance, decreased mobility, difficulty walking, decreased ROM, decreased strength, and pain.    ACTIVITY LIMITATIONS: carrying, lifting, bending, sitting,  standing, squatting, stairs, transfers, and bed mobility  PARTICIPATION LIMITATIONS: cleaning, laundry, shopping, community activity, and yard work  PERSONAL FACTORS: Time since onset of injury/illness/exacerbation and 1-2 comorbidities: HTN, Arthritis, past cervical ACDF procedure in 2021  are also affecting patient's functional outcome.   REHAB POTENTIAL: Good  CLINICAL DECISION MAKING: Stable/uncomplicated  EVALUATION COMPLEXITY: Moderate   GOALS: Goals reviewed with patient? Yes  SHORT TERM GOALS: Target date: 08/26/2022  Pt. Independent with HEP to increase B LE strength 1/2 muscle grade to improve standing/ independence with gait.  Baseline: See chart in objective, several increases but some remain the same  Goal status: IN PROGRESS  2.  Pt will decrease worst back pain as reported on NPRS by at least 2 points in order to demonstrate clinically significant reduction in back pain.  Baseline: EVAL= up to 8/10 at worst 08/14/22: 3/10 at worst  Goal status: MET   LONG TERM GOALS: Target date: 12/30/2022  Pt will improve FOTO to target score  of  60 or greater to display perceived improvements in ability to complete ADL's.  Baseline: EVAL= 47 5/8: 56 6/17: 56%; 10/07/2022= Did not administer today as patient was just assessed 2 weeks ago-  Goal status: IN PROGRESS  2.  Pt will decrease MODI score by at least 8 points in order demonstrate clinically significant reduction in back pain/disability.  Baseline: EVAL= 18 5/8: 10 6/17: 9/50  Goal status: MET  3.  Pt will decrease worst back pain as reported on NPRS by at least 3 points in order to demonstrate clinically significant reduction in back pain.  Baseline: EVAL= Up to 8/10 low back pain 08/14/22: 4/10 worst after work 6/1: 4/10; 10/07/2022- up to 5/10 after working 3 half days in a row.  Goal status: IN PROGRESS  4.  Pt will improve FGA by at least 3 points in order to  demonstrate clinically significant improvement in balance.   Baseline: EVAL: to be initiated next visit; 07/17/2022= 18/30 08/14/22:19/30 6/17:  20/30 Goal status: IN PROGRESS  5.  Pt will decrease 5TSTS by at least 3 seconds in order to demonstrate clinically significant improvement in LE strength Baseline: EVAL= 19.23 sec  08/14/22:14.38 sec Goal status: MET  6.  Pt will increase by at least 0.13 m/s in order to demonstrate clinically significant improvement in community ambulation.   Baseline: EVAL= 0.65 m/s without UE support; 10/07/2022= 0.71 m/s Goal status: PROGRESSING   7. Patient will improved 6 min walk test > 1100 feet  consistently for improved gait efficiency and improved overall gait endurance with community activities.  Baseline: 07/17/2022= 1050 feet with cane 5/8: 1230 ft no AD; 10/07/2022= 950 feet  Goal status: REVISED   8. Patient will report/demonstrate independence with all car transfers/tub transfers and floor transfers to maximize his functional independence in home/community.  Baseline: 10/07/2022= Patient reports difficulty due  To pain/weakness at times with transfers Goal status: INITIAL  PLAN:  PT FREQUENCY: 1-2x/week  PT DURATION: 12 weeks  PLANNED INTERVENTIONS: Therapeutic exercises, Therapeutic activity, Neuromuscular re-education, Balance training, Gait training, Patient/Family education, Self Care, Joint mobilization, Joint manipulation, Stair training, Vestibular training, Canalith repositioning, Orthotic/Fit training, DME instructions, Dry Needling, Spinal manipulation, Spinal mobilization, Cryotherapy, Moist heat, Taping, and Manual therapy.  PLAN FOR NEXT SESSION:   Perform GOAL assessment. Modify HEP as needed.     Nolon Bussing, PT, DPT Physical Therapist - [facility]  11/13/22, 5:02 PM

## 2022-11-18 ENCOUNTER — Ambulatory Visit: Payer: Medicare Other | Admitting: Physical Therapy

## 2022-11-18 DIAGNOSIS — R269 Unspecified abnormalities of gait and mobility: Secondary | ICD-10-CM | POA: Diagnosis not present

## 2022-11-18 DIAGNOSIS — G8929 Other chronic pain: Secondary | ICD-10-CM

## 2022-11-18 DIAGNOSIS — R278 Other lack of coordination: Secondary | ICD-10-CM

## 2022-11-18 DIAGNOSIS — R262 Difficulty in walking, not elsewhere classified: Secondary | ICD-10-CM

## 2022-11-18 DIAGNOSIS — M2560 Stiffness of unspecified joint, not elsewhere classified: Secondary | ICD-10-CM

## 2022-11-18 DIAGNOSIS — R2681 Unsteadiness on feet: Secondary | ICD-10-CM

## 2022-11-18 DIAGNOSIS — M6281 Muscle weakness (generalized): Secondary | ICD-10-CM

## 2022-11-18 DIAGNOSIS — M545 Low back pain, unspecified: Secondary | ICD-10-CM | POA: Diagnosis not present

## 2022-11-18 NOTE — Therapy (Signed)
OUTPATIENT PHYSICAL THERAPY THORACOLUMBAR TREATMENT    Patient Name: Larry Blair MRN: [id number] DOB:[birth year], 67 y.o., male Today's Date: 11/18/2022  END OF SESSION:  PT End of Session - 11/18/22 1322     Visit Number 32    Number of Visits 48    Date for PT Re-Evaluation 12/30/22    Authorization Type BCBS Medicare    Progress Note Due on Visit 30    PT Start Time 1318    PT Stop Time 1400    PT Time Calculation (min) 42 min    Equipment Utilized During Treatment Gait belt    Activity Tolerance Patient tolerated treatment well;No increased pain    Behavior During Therapy WFL for tasks assessed/performed                Past Medical History:  Diagnosis Date   Allergy    Arthritis    right shoulder   Asthma    Eczema    HTN (hypertension)    Hyperlipidemia    Substance abuse (HCC) 1976   daily   Past Surgical History:  Procedure Laterality Date   ANTERIOR CERVICAL DECOMP/DISCECTOMY FUSION N/A 08/30/2019   Procedure: CERVICAL THREE-FOUR ANTERIOR CERVICAL DECOMPRESSION/FUSION;  Surgeon: Tia Alert, MD;  Location: [facility] OR;  Service: Neurosurgery;  Laterality: N/A;   COLONOSCOPY     KNEE ARTHROSCOPY Left    Patient Active Problem List   Diagnosis Date Noted   Encounter for general adult medical examination with abnormal findings 07/04/2022   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 Sanda Linger  REFERRING PROVIDER: Dr. Sanda Linger  REFERRING DIAG: Chronic bilateral Low back pain without Sciatica  Rationale  for Evaluation and Treatment: Rehabilitation  THERAPY DIAG:  Muscle weakness (generalized)  Unsteadiness on feet  Difficulty in walking, not elsewhere classified  Difficulty in walking  Chronic bilateral low back pain without sciatica  Abnormality of gait and mobility  Other lack of coordination  Joint stiffness  ONSET DATE: 08/27/2022  SUBJECTIVE:  SUBJECTIVE STATEMENT:   Pt states that he had a great weekend. States that his last PT session was really good, and Enjoyed core activation activities.   PERTINENT HISTORY:  Fall with neck surgery - started it all - damaged vertebrae in my neck- 2021- s/p ACDF procedure, remote knee scope.   PAIN:  Are you having pain? No  PRECAUTIONS: Fall  WEIGHT BEARING RESTRICTIONS: No  FALLS:  Has patient fallen in last 6 months? No  PATIENT GOALS: Patient reports he wants to be able to walk again without a cane if possible.   NEXT MD VISIT: No follow up  OBJECTIVE:   Light touch: Impaired      TODAY'S TREATMENT:  11/18/22   TherEx  Nustep level reciprocal movement training and AAROM to improve extension, BUE/BLE   Level 2 x 1 min  level 4 x 2 min  Level 2 x 1 min   Bridge with 2 sec hold x10  Sidelying clamshell x 15 bil  hip abduction x 15 bil in sidelying  Supine dead bugs for core activation, 2x10  Noted to have decreased ROM due to fatigue on each BLE. Cues for deep core activation throughout.   TA:   6 Min Walk Test:  Instructed patient to ambulate as quickly and as safely as possible for 6 minutes using LRAD. Patient was allowed to take standing rest breaks without stopping the test, but if the patient required a sitting rest break the clock would be stopped and the test would be over.  Results: 1181 feet  using a SPC with  supervision assist. Results indicate that the patient has reduced endurance with ambulation compared to age matched norms.  Age Matched Norms: 65-69 yo M: 34 F: 71, 31-79 yo M: 62 F: 471, 79-89 yo M: 417 F: 392 MDC: 58.21 meters (190.98 feet) or 50 meters (ANPTA Core Set of Outcome Measures for Adults with Neurologic Conditions, 2018)  10 Meter Walk Test: Patient instructed to walk 10 meters (32.8 ft) as quickly and as safely as possible at their normal speed x2 and at a fast speed x2. Time measured from 2 meter mark to 8 meter mark to accommodate ramp-up and ramp-down.  Normal speed 1: 12.68 sec   Normal speed 2: 9.71 sec  Average Normal speed: 11.195 0.64m/s  Cut off scores: <0.4 m/s = household Ambulator, 0.4-0.8 m/s = limited community Ambulator, >0.8 m/s = community Ambulator, >1.2 m/s = crossing a street, <1.0 = increased fall risk MCID 0.05 m/s (small), 0.13 m/s (moderate), 0.06 m/s (significant)  (ANPTA Core Set of Outcome Measures for Adults with Neurologic Conditions, 2018)  Pt performed 5 time sit<>stand (5xSTS): 12.59 sec (>15 sec indicates increased fall risk)       PATIENT EDUCATION:  Education details: Pt educated throughout session about proper posture and technique with exercises. Improved exercise technique, movement at target joints, use of target muscles after min to mod verbal, visual, tactile cues.  Person educated: Patient Education method: Explanation, Demonstration, Tactile cues, and Verbal cues Education comprehension: verbalized understanding, returned demonstration, verbal cues required, tactile cues required, and needs further education  HOME EXERCISE PROGRAM: Access Code: [email or web address] URL: [email or web address] Date: 10/16/2022 Prepared by: Precious Bard Exercises - Standing Hip Flexor Stretch  - 1 x daily - 7 x weekly - 2 sets - 2 reps - 60 hold   Reviewed from last episode of care- will add to HEP as appropriate.   ASSESSMENT:  CLINICAL  IMPRESSION:   Pt demonstrated improved  function and increased speed on 5xSTS and , as well increased distance with 6 min walk test, and increased FOTO to 59. Instructed in deep core activation on this day with continued difficulty with maintaining BLE off bed with dead beg.   Pt will continue to benefit from skilled therapy to address remaining deficits in order to improve overall QoL and return to PLOF.     OBJECTIVE IMPAIRMENTS: Abnormal gait, decreased activity tolerance, decreased balance, decreased endurance, decreased mobility, difficulty walking, decreased ROM, decreased strength, and pain.   ACTIVITY LIMITATIONS: carrying, lifting, bending, sitting, standing, squatting, stairs, transfers, and bed mobility  PARTICIPATION LIMITATIONS: cleaning, laundry, shopping, community activity, and yard work  PERSONAL FACTORS: Time since onset of injury/illness/exacerbation and 1-2 comorbidities: HTN, Arthritis, past cervical ACDF procedure in 2021  are also affecting patient's functional outcome.   REHAB POTENTIAL: Good  CLINICAL DECISION MAKING: Stable/uncomplicated  EVALUATION COMPLEXITY: Moderate   GOALS: Goals reviewed with patient? Yes  SHORT TERM GOALS: Target date: 08/26/2022  Pt. Independent with HEP to increase B LE strength 1/2 muscle grade to improve standing/ independence with gait.  Baseline: See chart in objective, several increases but some remain the same  Goal status: IN PROGRESS  2.  Pt will decrease worst back pain as reported on NPRS by at least 2 points in order to demonstrate clinically significant reduction in back pain.  Baseline: EVAL= up to 8/10 at worst 08/14/22: 3/10 at worst  Goal status: MET   LONG TERM GOALS: Target date: 12/30/2022  Pt will improve FOTO to target score  of  60 or greater to display perceived improvements in ability to complete ADL's.  Baseline: EVAL= 47 5/8: 56 6/17: 56%; 10/07/2022= Did not administer today as patient was just assessed  2 weeks ago- 8/12:59  Goal status: IN PROGRESS  2.  Pt will decrease MODI score by at least 8 points in order demonstrate clinically significant reduction in back pain/disability.  Baseline: EVAL= 18 5/8: 10 6/17: 9/50  Goal status: MET  3.  Pt will decrease worst back pain as reported on NPRS by at least 3 points in order to demonstrate clinically significant reduction in back pain.  Baseline: EVAL= Up to 8/10 low back pain 08/14/22: 4/10 worst after work 6/1: 4/10; 10/07/2022- up to 5/10 after working 3 half days in a row.  8/12: pt reports back back 3/10 at worst after working for the last week   Goal status: MET  4.  Pt will improve FGA by at least 3 points in order to demonstrate clinically significant improvement in balance.   Baseline: EVAL: to be initiated next visit; 07/17/2022= 18/30 08/14/22:19/30 6/17:  20/30 Goal status: IN PROGRESS  5.  Pt will decrease 5TSTS by at least 3 seconds in order to demonstrate clinically significant improvement in LE strength Baseline: EVAL= 19.23 sec  08/14/22:14.38 sec 8/12: 12.59 Goal status: MET  6.  Pt will increase by at least 0.13 m/s in order to demonstrate clinically significant improvement in community ambulation.   Baseline: EVAL= 0.65 m/s without UE support; 10/07/2022= 0.71 m/s 8/12. 0.34m/s Goal status:MET   7. Patient will improved 6 min walk test > 1100 feet  consistently for improved gait efficiency and improved overall gait endurance with community activities.  Baseline: 07/17/2022= 1050 feet with cane 5/8: 1230 ft no AD; 10/07/2022= 950 feet 8/12: 1159ft Goal status: MET   8. Patient will report/demonstrate independence with all car transfers/tub transfers and floor transfers to maximize his functional independence  in home/community.  Baseline: 10/07/2022= Patient reports difficulty due  To pain/weakness at times with transfers 8/12: pt reports being able to stand from floor Modified independent; as long as he has UE support on  something sturdy.  Goal status: in progress.   PLAN:  PT FREQUENCY: 1-2x/week  PT DURATION: 12 weeks  PLANNED INTERVENTIONS: Therapeutic exercises, Therapeutic activity, Neuromuscular re-education, Balance training, Gait training, Patient/Family education, Self Care, Joint mobilization, Joint manipulation, Stair training, Vestibular training, Canalith repositioning, Orthotic/Fit training, DME instructions, Dry Needling, Spinal manipulation, Spinal mobilization, Cryotherapy, Moist heat, Taping, and Manual therapy.  PLAN FOR NEXT SESSION:   Deep core activation LPB management. Modify HEP as needed.    Grier Rocher PT, DPT  Physical Therapist - [facility]  2:14 PM 11/18/22

## 2022-11-20 ENCOUNTER — Encounter: Payer: Self-pay | Admitting: Internal Medicine

## 2022-11-20 ENCOUNTER — Ambulatory Visit: Payer: Medicare Other

## 2022-11-20 ENCOUNTER — Encounter: Payer: Self-pay | Admitting: Physical Therapy

## 2022-11-20 DIAGNOSIS — M545 Low back pain, unspecified: Secondary | ICD-10-CM | POA: Diagnosis not present

## 2022-11-20 DIAGNOSIS — R262 Difficulty in walking, not elsewhere classified: Secondary | ICD-10-CM

## 2022-11-20 DIAGNOSIS — R269 Unspecified abnormalities of gait and mobility: Secondary | ICD-10-CM | POA: Diagnosis not present

## 2022-11-20 DIAGNOSIS — M6281 Muscle weakness (generalized): Secondary | ICD-10-CM | POA: Diagnosis not present

## 2022-11-20 DIAGNOSIS — R278 Other lack of coordination: Secondary | ICD-10-CM

## 2022-11-20 DIAGNOSIS — M2560 Stiffness of unspecified joint, not elsewhere classified: Secondary | ICD-10-CM | POA: Diagnosis not present

## 2022-11-20 DIAGNOSIS — G8929 Other chronic pain: Secondary | ICD-10-CM | POA: Diagnosis not present

## 2022-11-20 DIAGNOSIS — R2681 Unsteadiness on feet: Secondary | ICD-10-CM

## 2022-11-20 NOTE — Therapy (Signed)
OUTPATIENT PHYSICAL THERAPY THORACOLUMBAR TREATMENT    Patient Name: Larry Blair MRN: [id number] DOB:02-28-1956, 67 y.o., male Today's Date: 11/20/2022  END OF SESSION:  PT End of Session - 11/20/22 1450     Visit Number 33    Number of Visits 48    Date for PT Re-Evaluation 12/30/22    Authorization Type BCBS Medicare    Progress Note Due on Visit 30    PT Start Time 1449    PT Stop Time 1528    PT Time Calculation (min) 39 min    Equipment Utilized During Treatment Gait belt    Activity Tolerance Patient tolerated treatment well;No increased pain    Behavior During Therapy WFL for tasks assessed/performed                 Past Medical History:  Diagnosis Date   Allergy    Arthritis    right shoulder   Asthma    Eczema    HTN (hypertension)    Hyperlipidemia    Substance abuse (HCC) 1976   daily   Past Surgical History:  Procedure Laterality Date   ANTERIOR CERVICAL DECOMP/DISCECTOMY FUSION N/A 08/30/2019   Procedure: CERVICAL THREE-FOUR ANTERIOR CERVICAL DECOMPRESSION/FUSION;  Surgeon: Tia Alert, MD;  Location: [facility] OR;  Service: Neurosurgery;  Laterality: N/A;   COLONOSCOPY     KNEE ARTHROSCOPY Left    Patient Active Problem List   Diagnosis Date Noted   Encounter for general adult medical examination with abnormal findings 07/04/2022   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 Sanda Linger  REFERRING PROVIDER: Dr. Sanda Linger  REFERRING DIAG: Chronic bilateral Low back pain without  Sciatica  Rationale for Evaluation and Treatment: Rehabilitation  THERAPY DIAG:  Muscle weakness (generalized)  Unsteadiness on feet  Difficulty in walking, not elsewhere classified  Chronic bilateral low back pain without sciatica  Other lack of coordination  ONSET DATE: 08/27/2022  SUBJECTIVE:                                                                                                                                                                                           SUBJECTIVE STATEMENT:   Patient reports 0/10 pain on arrival. While doing  housework yesterday, reports pain on the front of the R lower leg that resolved after ~10 minutes.   PERTINENT HISTORY:  Fall with neck surgery - started it all - damaged vertebrae in my neck- 2021- s/p ACDF procedure, remote knee scope.   PAIN:  Are you having pain? No  PRECAUTIONS: Fall  WEIGHT BEARING RESTRICTIONS: No  FALLS:  Has patient fallen in last 6 months? No  PATIENT GOALS: Patient reports he wants to be able to walk again without a cane if possible.   NEXT MD VISIT: No follow up  OBJECTIVE:   Light touch: Impaired      TODAY'S TREATMENT:  11/20/22    TherEx  Nustep level reciprocal movement training and AAROM to improve extension, BUE/BLE   Level 4 x 5 minutes    Supine trunk rotation 3 second hold x 10  Bridge with 3 sec hold 3x10 with RTB around distal thighs   Sidelying clamshell 2 x 15 bil with RTB around distal thighs, 2 x 15 GTB around distal thighs  Supine dead bugs for core activation, 2x10  Prone supermans 2 x 15  Prone press ups 3 second holds x 15 Quadruped bird dogs 2 x 10 Sit to stand 2 x 15 with no UE support    Noted to have decreased ROM due to fatigue on each BLE. Cues for deep core activation throughout.    PATIENT EDUCATION:  Education details: Pt educated throughout session about proper posture and technique with exercises. Improved exercise technique, movement at target  joints, use of target muscles after min to mod verbal, visual, tactile cues.  Person educated: Patient Education method: Explanation, Demonstration, Tactile cues, and Verbal cues Education comprehension: verbalized understanding, returned demonstration, verbal cues required, tactile cues required, and needs further education  HOME EXERCISE PROGRAM: Access Code: [email or web address] URL: [email or web address] Date: 10/16/2022 Prepared by: Precious Bard Exercises - Standing Hip Flexor Stretch  - 1 x daily - 7 x weekly - 2 sets - 2 reps - 60 hold   Reviewed from last episode of care- will add to HEP as appropriate.   ASSESSMENT:  CLINICAL IMPRESSION:    Patient arrives to treatment session motivated to participate with no reports of pain in back. Session focused on hip abductor and core strengthening. Continued difficulty with maintaining BLE off bed with dead bug. Patient will continue to benefit from skilled therapy to address remaining deficits in order to improve overall QoL and return to PLOF.     OBJECTIVE IMPAIRMENTS: Abnormal gait, decreased activity tolerance, decreased balance, decreased endurance, decreased mobility, difficulty walking, decreased ROM, decreased strength, and pain.   ACTIVITY LIMITATIONS: carrying, lifting, bending, sitting, standing, squatting, stairs, transfers, and bed mobility  PARTICIPATION LIMITATIONS: cleaning, laundry, shopping, community activity, and yard work  PERSONAL FACTORS: Time since onset of injury/illness/exacerbation and 1-2 comorbidities: HTN, Arthritis, past cervical ACDF procedure in 2021  are also affecting patient's functional outcome.   REHAB POTENTIAL: Good  CLINICAL DECISION MAKING: Stable/uncomplicated  EVALUATION COMPLEXITY: Moderate   GOALS: Goals reviewed with patient? Yes  SHORT TERM GOALS: Target date: 08/26/2022  Pt. Independent with HEP to increase B LE strength 1/2 muscle grade to improve standing/ independence with  gait.  Baseline: See chart in objective, several increases but some remain the same  Goal status: IN PROGRESS  2.  Pt will decrease worst back pain as reported on NPRS by at least 2 points in order to demonstrate clinically significant reduction in back pain.  Baseline: EVAL= up to  8/10 at worst 08/14/22: 3/10 at worst  Goal status: MET   LONG TERM GOALS: Target date: 12/30/2022  Pt will improve FOTO to target score  of  60 or greater to display perceived improvements in ability to complete ADL's.  Baseline: EVAL= 47 5/8: 56 6/17: 56%; 10/07/2022= Did not administer today as patient was just assessed 2 weeks ago- 8/12:59  Goal status: IN PROGRESS  2.  Pt will decrease MODI score by at least 8 points in order demonstrate clinically significant reduction in back pain/disability.  Baseline: EVAL= 18 5/8: 10 6/17: 9/50  Goal status: MET  3.  Pt will decrease worst back pain as reported on NPRS by at least 3 points in order to demonstrate clinically significant reduction in back pain.  Baseline: EVAL= Up to 8/10 low back pain 08/14/22: 4/10 worst after work 6/1: 4/10; 10/07/2022- up to 5/10 after working 3 half days in a row.  8/12: pt reports back back 3/10 at worst after working for the last week   Goal status: MET  4.  Pt will improve FGA by at least 3 points in order to demonstrate clinically significant improvement in balance.   Baseline: EVAL: to be initiated next visit; 07/17/2022= 18/30 08/14/22:19/30 6/17:  20/30 Goal status: IN PROGRESS  5.  Pt will decrease 5TSTS by at least 3 seconds in order to demonstrate clinically significant improvement in LE strength Baseline: EVAL= 19.23 sec  08/14/22:14.38 sec 8/12: 12.59 Goal status: MET  6.  Pt will increase by at least 0.13 m/s in order to demonstrate clinically significant improvement in community ambulation.   Baseline: EVAL= 0.65 m/s without UE support; 10/07/2022= 0.71 m/s 8/12. 0.15m/s Goal status:MET   7. Patient will improved 6 min  walk test > 1100 feet  consistently for improved gait efficiency and improved overall gait endurance with community activities.  Baseline: 07/17/2022= 1050 feet with cane 5/8: 1230 ft no AD; 10/07/2022= 950 feet 8/12: 1157ft Goal status: MET   8. Patient will report/demonstrate independence with all car transfers/tub transfers and floor transfers to maximize his functional independence in home/community.  Baseline: 10/07/2022= Patient reports difficulty due  To pain/weakness at times with transfers 8/12: pt reports being able to stand from floor Modified independent; as long as he has UE support on something sturdy.  Goal status: in progress.   PLAN:  PT FREQUENCY: 1-2x/week  PT DURATION: 12 weeks  PLANNED INTERVENTIONS: Therapeutic exercises, Therapeutic activity, Neuromuscular re-education, Balance training, Gait training, Patient/Family education, Self Care, Joint mobilization, Joint manipulation, Stair training, Vestibular training, Canalith repositioning, Orthotic/Fit training, DME instructions, Dry Needling, Spinal manipulation, Spinal mobilization, Cryotherapy, Moist heat, Taping, and Manual therapy.  PLAN FOR NEXT SESSION:   Deep core activation LBP management. Modify HEP as needed.    Maylon Peppers, PT, DPT  Physical Therapist - [facility]  2:50 PM 11/20/22

## 2022-11-25 ENCOUNTER — Ambulatory Visit: Payer: Medicare Other | Admitting: Physical Therapy

## 2022-11-25 ENCOUNTER — Ambulatory Visit (AMBULATORY_SURGERY_CENTER): Payer: Medicare Other

## 2022-11-25 VITALS — Ht 71.0 in | Wt 185.0 lb

## 2022-11-25 DIAGNOSIS — Z800 Family history of malignant neoplasm of digestive organs: Secondary | ICD-10-CM

## 2022-11-25 MED ORDER — PLENVU 140 G PO SOLR: 1.0000 | ORAL | 0 refills | Status: DC

## 2022-11-25 NOTE — Therapy (Deleted)
OUTPATIENT PHYSICAL THERAPY THORACOLUMBAR TREATMENT    Patient Name: Larry Blair MRN: [id number] DOB:Jan 18, 1956, 67 y.o., male Today's Date: 11/25/2022  END OF SESSION:        Past Medical History:  Diagnosis Date   Allergy    Arthritis    right shoulder   Asthma    Eczema    HTN (hypertension)    Hyperlipidemia    Substance abuse (HCC) 1976   daily   Past Surgical History:  Procedure Laterality Date   ANTERIOR CERVICAL DECOMP/DISCECTOMY FUSION N/A 08/30/2019   Procedure: CERVICAL THREE-FOUR ANTERIOR CERVICAL DECOMPRESSION/FUSION;  Surgeon: Tia Alert, MD;  Location: [facility] OR;  Service: Neurosurgery;  Laterality: N/A;   COLONOSCOPY     KNEE ARTHROSCOPY Left    Patient Active Problem List   Diagnosis Date Noted   Encounter for general adult medical examination with abnormal findings 07/04/2022   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 Sanda Linger  REFERRING PROVIDER: Dr. Sanda Linger  REFERRING DIAG: Chronic bilateral Low back pain without Sciatica  Rationale for Evaluation and Treatment: Rehabilitation  THERAPY DIAG:  No diagnosis found.  ONSET DATE: 08/27/2022  SUBJECTIVE:                                                                                                                                                                                           SUBJECTIVE STATEMENT:   Patient reports 0/10 pain on arrival. While doing housework yesterday, reports pain on the front of the R lower leg that resolved  after ~10 minutes.   PERTINENT HISTORY:  Fall with neck surgery - started it all - damaged vertebrae in my neck- 2021- s/p ACDF procedure, remote knee scope.   PAIN:  Are you having pain? No  PRECAUTIONS: Fall  WEIGHT BEARING RESTRICTIONS: No  FALLS:  Has patient fallen in last 6 months? No  PATIENT GOALS: Patient reports he wants to be able to walk again without a cane if possible.   NEXT MD VISIT: No follow up  OBJECTIVE:   Light touch: Impaired      TODAY'S TREATMENT:  11/25/22    TherEx  Nustep level reciprocal movement training and AAROM to improve extension, BUE/BLE   Level  4 x 5 minutes    Supine trunk rotation 3 second hold x 10  Bridge with 3 sec hold 3x10 with RTB around distal thighs   Sidelying clamshell 2 x 15 bil with RTB around distal thighs, 2 x 15 GTB around distal thighs  Supine dead bugs for core activation, 2x10  Prone supermans 2 x 15  Prone press ups 3 second holds x 15 Quadruped bird dogs 2 x 10 Sit to stand 2 x 15 with no UE support    Noted to have decreased ROM due to fatigue on each BLE. Cues for deep core activation throughout.    PATIENT EDUCATION:  Education details: Pt educated throughout session about proper posture and technique with exercises. Improved exercise technique, movement at target joints, use of target muscles after min to mod verbal, visual, tactile cues.  Person educated: Patient Education method: Explanation, Demonstration, Tactile cues, and Verbal cues Education comprehension: verbalized understanding, returned demonstration, verbal cues required, tactile cues required, and needs further education  HOME EXERCISE PROGRAM: Access Code: [email or web address] URL: [email or web address] Date: 10/16/2022 Prepared by: Precious Bard Exercises - Standing Hip Flexor Stretch  - 1 x daily - 7 x weekly - 2 sets - 2 reps - 60 hold   Reviewed from last episode of care- will add to HEP as appropriate.   ASSESSMENT:  CLINICAL  IMPRESSION:    Patient arrives to treatment session motivated to participate with no reports of pain in back. Session focused on hip abductor and core strengthening. Continued difficulty with maintaining BLE off bed with dead bug. Patient will continue to benefit from skilled therapy to address remaining deficits in order to improve overall QoL and return to PLOF.     OBJECTIVE IMPAIRMENTS: Abnormal gait, decreased activity tolerance, decreased balance, decreased endurance, decreased mobility, difficulty walking, decreased ROM, decreased strength, and pain.   ACTIVITY LIMITATIONS: carrying, lifting, bending, sitting, standing, squatting, stairs, transfers, and bed mobility  PARTICIPATION LIMITATIONS: cleaning, laundry, shopping, community activity, and yard work  PERSONAL FACTORS: Time since onset of injury/illness/exacerbation and 1-2 comorbidities: HTN, Arthritis, past cervical ACDF procedure in 2021  are also affecting patient's functional outcome.   REHAB POTENTIAL: Good  CLINICAL DECISION MAKING: Stable/uncomplicated  EVALUATION COMPLEXITY: Moderate   GOALS: Goals reviewed with patient? Yes  SHORT TERM GOALS: Target date: 08/26/2022  Pt. Independent with HEP to increase B LE strength 1/2 muscle grade to improve standing/ independence with gait.  Baseline: See chart in objective, several increases but some remain the same  Goal status: IN PROGRESS  2.  Pt will decrease worst back pain as reported on NPRS by at least 2 points in order to demonstrate clinically significant reduction in back pain.  Baseline: EVAL= up to 8/10 at worst 08/14/22: 3/10 at worst  Goal status: MET   LONG TERM GOALS: Target date: 12/30/2022  Pt will improve FOTO to target score  of  60 or greater to display perceived improvements in ability to complete ADL's.  Baseline: EVAL= 47 5/8: 56 6/17: 56%; 10/07/2022= Did not administer today as patient was just assessed 2 weeks ago- 8/12:59  Goal status: IN  PROGRESS  2.  Pt will decrease MODI score by at least 8 points in order demonstrate clinically significant reduction in back pain/disability.  Baseline: EVAL= 18 5/8: 10 6/17: 9/50  Goal status: MET  3.  Pt will decrease worst back pain as reported on NPRS by at least 3 points in order to demonstrate clinically significant reduction in back pain.  Baseline: EVAL= Up to 8/10 low back pain 08/14/22: 4/10 worst after work 6/1: 4/10; 10/07/2022- up to 5/10 after working 3 half days in a row.  8/12: pt reports back back 3/10 at worst after working for the last week   Goal status: MET  4.  Pt will improve FGA by at least 3 points in order to demonstrate clinically significant improvement in balance.   Baseline: EVAL: to be initiated next visit; 07/17/2022= 18/30 08/14/22:19/30 6/17:  20/30 Goal status: IN PROGRESS  5.  Pt will decrease 5TSTS by at least 3 seconds in order to demonstrate clinically significant improvement in LE strength Baseline: EVAL= 19.23 sec  08/14/22:14.38 sec 8/12: 12.59 Goal status: MET  6.  Pt will increase by at least 0.13 m/s in order to demonstrate clinically significant improvement in community ambulation.   Baseline: EVAL= 0.65 m/s without UE support; 10/07/2022= 0.71 m/s 8/12. 0.11m/s Goal status:MET   7. Patient will improved 6 min walk test > 1100 feet  consistently for improved gait efficiency and improved overall gait endurance with community activities.  Baseline: 07/17/2022= 1050 feet with cane 5/8: 1230 ft no AD; 10/07/2022= 950 feet 8/12: 1149ft Goal status: MET   8. Patient will report/demonstrate independence with all car transfers/tub transfers and floor transfers to maximize his functional independence in home/community.  Baseline: 10/07/2022= Patient reports difficulty due  To pain/weakness at times with transfers 8/12: pt reports being able to stand from floor Modified independent; as long as he has UE support on something sturdy.  Goal status: in progress.    PLAN:  PT FREQUENCY: 1-2x/week  PT DURATION: 12 weeks  PLANNED INTERVENTIONS: Therapeutic exercises, Therapeutic activity, Neuromuscular re-education, Balance training, Gait training, Patient/Family education, Self Care, Joint mobilization, Joint manipulation, Stair training, Vestibular training, Canalith repositioning, Orthotic/Fit training, DME instructions, Dry Needling, Spinal manipulation, Spinal mobilization, Cryotherapy, Moist heat, Taping, and Manual therapy.  PLAN FOR NEXT SESSION:   Deep core activation LBP management. Modify HEP as needed.    Maylon Peppers, PT, DPT  Physical Therapist - [facility]  [facility]  8:00 AM 11/25/22

## 2022-11-25 NOTE — Progress Notes (Signed)
Pre visit completed via phone call; Patient verified name, DOB, and address;  No egg or soy allergy known to patient;  No issues known to pt with past sedation with any surgeries or procedures; Patient denies ever being told they had issues or difficulty with intubation;  No FH of Malignant Hyperthermia; Pt is not on diet pills; Pt is not on home 02;  Pt is not on blood thinners;   Pt reports issues with constipation - takes a daily Colace as well as Dulcolax daily for constipation prevention- advised patient to continue these medications, esp prior to procedure; No A fib or A flutter;   Have any cardiac testing pending--NO Insurance verified during PV appt--- BCBS Medicare  Pt can ambulate without assistance;  Pt denies use of chewing tobacco; Discussed diabetic/weight loss medication holds; Discussed NSAID holds; Checked BMI to be less than 50; Pt instructed to use Singlecare.com or GoodRx for a price reduction on prep;  Patient's chart reviewed by Cathlyn Parsons CNRA prior to previsit and patient appropriate for the [facility].  Pre visit completed and red dot placed by patient's name on their procedure day (on provider's schedule).    Instructions sent to patient via MyChart; [employer] Medicare coupon information sent in on RX; BIN: [id number]  PCN: [id number]  group#: [id number]   [id number]

## 2022-11-26 ENCOUNTER — Telehealth: Payer: Self-pay

## 2022-11-26 NOTE — Telephone Encounter (Signed)
*  Gastro  Plenv[employer] PA request received; pending possible med change   Key: [id number]

## 2022-11-27 ENCOUNTER — Ambulatory Visit: Payer: Medicare Other

## 2022-11-27 DIAGNOSIS — R262 Difficulty in walking, not elsewhere classified: Secondary | ICD-10-CM | POA: Diagnosis not present

## 2022-11-27 DIAGNOSIS — R269 Unspecified abnormalities of gait and mobility: Secondary | ICD-10-CM | POA: Diagnosis not present

## 2022-11-27 DIAGNOSIS — R278 Other lack of coordination: Secondary | ICD-10-CM

## 2022-11-27 DIAGNOSIS — M2560 Stiffness of unspecified joint, not elsewhere classified: Secondary | ICD-10-CM | POA: Diagnosis not present

## 2022-11-27 DIAGNOSIS — G8929 Other chronic pain: Secondary | ICD-10-CM

## 2022-11-27 DIAGNOSIS — R2681 Unsteadiness on feet: Secondary | ICD-10-CM

## 2022-11-27 DIAGNOSIS — M545 Low back pain, unspecified: Secondary | ICD-10-CM | POA: Diagnosis not present

## 2022-11-27 DIAGNOSIS — M6281 Muscle weakness (generalized): Secondary | ICD-10-CM | POA: Diagnosis not present

## 2022-11-27 NOTE — Telephone Encounter (Signed)
Al Decant,  I placed this information in the RX itself due to the patient requesting to have THIS prep-  Here is the information you will need to run the RX= [employer] Medicare coupon information sent in on RX; BIN: [id number]  PCN: [id number]  group#: [id number]   JY:[id number]  Please use this information to have the RX covered for the patient- HE requested this prep and verbally stated he knew it might not be covered by insurance and knew there would be some out of pocket cost Thank you Bre, PV RN

## 2022-11-27 NOTE — Therapy (Signed)
OUTPATIENT PHYSICAL THERAPY THORACOLUMBAR TREATMENT    Patient Name: Larry Blair MRN: [id number] DOB:[birth year], 67 y.o., male Today's Date: 11/28/2022  END OF SESSION:  PT End of Session - 11/27/22 1459     Visit Number 34    Number of Visits 48    Date for PT Re-Evaluation 12/30/22    Authorization Type BCBS Medicare    Progress Note Due on Visit 30    PT Start Time 1450    PT Stop Time 1529    PT Time Calculation (min) 39 min    Equipment Utilized During Treatment Gait belt    Activity Tolerance Patient tolerated treatment well;No increased pain    Behavior During Therapy WFL for tasks assessed/performed                  Past Medical History:  Diagnosis Date   Allergy    Arthritis    bilateral shoulders/LEFT knee   Asthma    Eczema    HTN (hypertension)    Hyperlipidemia    Substance abuse (HCC) 1976   daily   Past Surgical History:  Procedure Laterality Date   ANTERIOR CERVICAL DECOMP/DISCECTOMY FUSION N/A 08/30/2019   Procedure: CERVICAL THREE-FOUR ANTERIOR CERVICAL DECOMPRESSION/FUSION;  Surgeon: Tia Alert, MD;  [facility]: [facility];  Service: Neurosurgery;  Laterality: N/A;   COLONOSCOPY  09/2017   HD-MAC-Plenvu (exc)-hems   KNEE ARTHROSCOPY Left    WISDOM TOOTH EXTRACTION     Patient Active Problem List   Diagnosis Date Noted   Encounter for general adult medical examination with abnormal findings 07/04/2022   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 Sanda Linger  REFERRING PROVIDER: Dr. Sanda Linger  REFERRING DIAG: Chronic bilateral Low back pain without Sciatica  Rationale for Evaluation and Treatment: Rehabilitation  THERAPY DIAG:  Muscle weakness (generalized)  Unsteadiness on feet  Difficulty in walking, not elsewhere classified  Chronic bilateral low back pain without sciatica  Other lack of coordination  Difficulty in walking  Abnormality of gait and mobility  Joint stiffness  ONSET DATE: 08/27/2022  SUBJECTIVE:  SUBJECTIVE STATEMENT:   Patient reports some sciatic pain for a couple of day but states doing much better. States has been working on getting up off floor and feeling some better overall.   PERTINENT HISTORY:  Fall with neck surgery - started it all - damaged vertebrae in my neck- 2021- s/p ACDF procedure, remote knee scope.   PAIN:  Are you having pain? No  PRECAUTIONS: Fall  WEIGHT BEARING RESTRICTIONS: No  FALLS:  Has patient fallen in last 6 months? No  PATIENT GOALS: Patient reports he wants to be able to walk again without a cane if possible.   NEXT MD VISIT: No follow up  OBJECTIVE:   Light touch: Impaired      TODAY'S TREATMENT:  11/28/22    TherEx  Self stretching in seated position   Hamstring- hold 30 sec x 3 each LE Thoracic rotation - hold 30 sec x 3 each LE Figure 4 - hold 30 sec x 3 each LE Lumbar flex- hold 30 sec x 3 each LE  Tall kneeling- at support bar with knees supported on mat- Hip ER/ER into 1/2 kneeling and back x 12 reps each LE  NMR:   [place] balance activities-  Static standing balance training- multiple attempt to hold 30 sec (score varied between 491 - 878)  Dynamic ant/pos/lateral weight shifting- Challenging limits of stability- using TV screen as visual feedback Maze game - Level 1-2 x multiple  attempts        PATIENT EDUCATION:  Education details: Pt educated throughout session about proper posture and technique with exercises. Improved exercise technique, movement at target joints, use of target muscles after min to mod verbal, visual, tactile cues.  Person educated: Patient Education method: Explanation, Demonstration, Tactile cues, and Verbal cues Education comprehension: verbalized understanding, returned demonstration, verbal cues required, tactile cues required, and needs further education  HOME EXERCISE PROGRAM: Access Code: [email or web address] URL: [email or web address] Date: 10/16/2022 Prepared by: Precious Bard Exercises - Standing Hip Flexor Stretch  - 1 x daily - 7 x weekly - 2 sets - 2 reps - 60 hold   Reviewed from last episode of care- will add to HEP as appropriate.   ASSESSMENT:  CLINICAL IMPRESSION:    Patient arrives to treatment session motivated and responded well to self stretching and later hip rotation strengthening without issues. He was challenged with balance activities producing mostly ankle righting reaction with overall good weight shifting and reaction. Some difficulty with hip rotation on right vs. Left and will continue to be an area of focus. Patient will continue to benefit from skilled therapy to address remaining deficits in order to improve overall QoL and return to PLOF.     OBJECTIVE IMPAIRMENTS: Abnormal gait, decreased activity tolerance, decreased balance, decreased endurance, decreased mobility, difficulty walking, decreased ROM, decreased strength, and pain.   ACTIVITY LIMITATIONS: carrying, lifting, bending, sitting, standing, squatting, stairs, transfers, and bed mobility  PARTICIPATION LIMITATIONS: cleaning, laundry, shopping, community activity, and yard work  PERSONAL FACTORS: Time since onset of injury/illness/exacerbation and 1-2 comorbidities: HTN, Arthritis, past cervical ACDF procedure in 2021  are also affecting  patient's functional outcome.   REHAB POTENTIAL: Good  CLINICAL DECISION MAKING: Stable/uncomplicated  EVALUATION COMPLEXITY: Moderate   GOALS: Goals reviewed with patient? Yes  SHORT TERM GOALS: Target date: 08/26/2022  Pt. Independent with HEP to increase B LE strength 1/2 muscle grade to improve standing/ independence with gait.  Baseline: See chart in objective, several increases but some remain the same  Goal  status: IN PROGRESS  2.  Pt will decrease worst back pain as reported on NPRS by at least 2 points in order to demonstrate clinically significant reduction in back pain.  Baseline: EVAL= up to 8/10 at worst 08/14/22: 3/10 at worst  Goal status: MET   LONG TERM GOALS: Target date: 12/30/2022  Pt will improve FOTO to target score  of  60 or greater to display perceived improvements in ability to complete ADL's.  Baseline: EVAL= 47 5/8: 56 6/17: 56%; 10/07/2022= Did not administer today as patient was just assessed 2 weeks ago- 8/12:59  Goal status: IN PROGRESS  2.  Pt will decrease MODI score by at least 8 points in order demonstrate clinically significant reduction in back pain/disability.  Baseline: EVAL= 18 5/8: 10 6/17: 9/50  Goal status: MET  3.  Pt will decrease worst back pain as reported on NPRS by at least 3 points in order to demonstrate clinically significant reduction in back pain.  Baseline: EVAL= Up to 8/10 low back pain 08/14/22: 4/10 worst after work 6/1: 4/10; 10/07/2022- up to 5/10 after working 3 half days in a row.  8/12: pt reports back back 3/10 at worst after working for the last week   Goal status: MET  4.  Pt will improve FGA by at least 3 points in order to demonstrate clinically significant improvement in balance.   Baseline: EVAL: to be initiated next visit; 07/17/2022= 18/30 08/14/22:19/30 6/17:  20/30 Goal status: IN PROGRESS  5.  Pt will decrease 5TSTS by at least 3 seconds in order to demonstrate clinically significant improvement in LE  strength Baseline: EVAL= 19.23 sec  08/14/22:14.38 sec 8/12: 12.59 Goal status: MET  6.  Pt will increase by at least 0.13 m/s in order to demonstrate clinically significant improvement in community ambulation.   Baseline: EVAL= 0.65 m/s without UE support; 10/07/2022= 0.71 m/s 8/12. 0.41m/s Goal status:MET   7. Patient will improved 6 min walk test > 1100 feet  consistently for improved gait efficiency and improved overall gait endurance with community activities.  Baseline: 07/17/2022= 1050 feet with cane 5/8: 1230 ft no AD; 10/07/2022= 950 feet 8/12: 1169ft Goal status: MET   8. Patient will report/demonstrate independence with all car transfers/tub transfers and floor transfers to maximize his functional independence in home/community.  Baseline: 10/07/2022= Patient reports difficulty due  To pain/weakness at times with transfers 8/12: pt reports being able to stand from floor Modified independent; as long as he has UE support on something sturdy.  Goal status: in progress.   PLAN:  PT FREQUENCY: 1-2x/week  PT DURATION: 12 weeks  PLANNED INTERVENTIONS: Therapeutic exercises, Therapeutic activity, Neuromuscular re-education, Balance training, Gait training, Patient/Family education, Self Care, Joint mobilization, Joint manipulation, Stair training, Vestibular training, Canalith repositioning, Orthotic/Fit training, DME instructions, Dry Needling, Spinal manipulation, Spinal mobilization, Cryotherapy, Moist heat, Taping, and Manual therapy.  PLAN FOR NEXT SESSION:   Deep core activation LBP management. Modify HEP as needed.   Louis Meckel, PT Physical Therapist - [facility]  1:13 PM 11/28/22

## 2022-11-29 ENCOUNTER — Telehealth: Payer: Self-pay | Admitting: Gastroenterology

## 2022-11-29 ENCOUNTER — Encounter: Payer: Self-pay | Admitting: Gastroenterology

## 2022-11-29 NOTE — Telephone Encounter (Signed)
Informed pt that new set of instructions have been sent to my chart and pt found it inmy chart wihile on the phone. Instructed him that a hard copy coming in the mail as well.

## 2022-11-29 NOTE — Telephone Encounter (Signed)
Inbound call from patient stating he has not received prep instructions in the mail or prep medication through his pharmacy. Patient is requesting a call back regarding both. Please advise, thank you.

## 2022-12-02 ENCOUNTER — Ambulatory Visit: Payer: Medicare Other | Admitting: Physical Therapy

## 2022-12-02 DIAGNOSIS — R278 Other lack of coordination: Secondary | ICD-10-CM | POA: Diagnosis not present

## 2022-12-02 DIAGNOSIS — G8929 Other chronic pain: Secondary | ICD-10-CM | POA: Diagnosis not present

## 2022-12-02 DIAGNOSIS — R269 Unspecified abnormalities of gait and mobility: Secondary | ICD-10-CM | POA: Diagnosis not present

## 2022-12-02 DIAGNOSIS — R2681 Unsteadiness on feet: Secondary | ICD-10-CM

## 2022-12-02 DIAGNOSIS — M2560 Stiffness of unspecified joint, not elsewhere classified: Secondary | ICD-10-CM

## 2022-12-02 DIAGNOSIS — M545 Low back pain, unspecified: Secondary | ICD-10-CM | POA: Diagnosis not present

## 2022-12-02 DIAGNOSIS — R262 Difficulty in walking, not elsewhere classified: Secondary | ICD-10-CM

## 2022-12-02 DIAGNOSIS — M6281 Muscle weakness (generalized): Secondary | ICD-10-CM | POA: Diagnosis not present

## 2022-12-02 NOTE — Therapy (Signed)
OUTPATIENT PHYSICAL THERAPY THORACOLUMBAR TREATMENT    Patient Name: Larry Blair MRN: [id number] DOB:January 03, 1956, 67 y.o., male Today's Date: 12/02/2022  END OF SESSION:  PT End of Session - 12/02/22 1320     Visit Number 35    Number of Visits 48    Date for PT Re-Evaluation 12/30/22    Authorization Type BCBS Medicare    Progress Note Due on Visit 40    PT Start Time 1320    PT Stop Time 1400    PT Time Calculation (min) 40 min    Equipment Utilized During Treatment Gait belt    Activity Tolerance Patient tolerated treatment well;No increased pain    Behavior During Therapy WFL for tasks assessed/performed                  Past Medical History:  Diagnosis Date   Allergy    Arthritis    bilateral shoulders/LEFT knee   Asthma    Eczema    HTN (hypertension)    Hyperlipidemia    Substance abuse (HCC) 1976   daily   Past Surgical History:  Procedure Laterality Date   ANTERIOR CERVICAL DECOMP/DISCECTOMY FUSION N/A 08/30/2019   Procedure: CERVICAL THREE-FOUR ANTERIOR CERVICAL DECOMPRESSION/FUSION;  Surgeon: Tia Alert, MD;  Location: [facility] OR;  Service: Neurosurgery;  Laterality: N/A;   COLONOSCOPY  09/2017   HD-MAC-Plenvu (exc)-hems   KNEE ARTHROSCOPY Left    WISDOM TOOTH EXTRACTION     Patient Active Problem List   Diagnosis Date Noted   Encounter for general adult medical examination with abnormal findings 07/04/2022   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 Sanda Linger  REFERRING PROVIDER: Dr. Sanda Linger  REFERRING DIAG: Chronic bilateral Low back pain without Sciatica  Rationale for Evaluation and Treatment: Rehabilitation  THERAPY DIAG:  Muscle weakness (generalized)  Unsteadiness on feet  Difficulty in walking, not elsewhere classified  Other lack of coordination  Difficulty in walking  Abnormality of gait and mobility  Joint stiffness  Chronic bilateral low back pain without sciatica  ONSET DATE: 08/27/2022  SUBJECTIVE:  SUBJECTIVE STATEMENT:   Patient reports that he is feeling really good on this day. States that he was able to work all weekend without increaed back pain. States that if he could improve strength in the RLE, he would be ready to d/c from PT for a little while.   PERTINENT HISTORY:  Fall with neck surgery - started it all - damaged vertebrae in my neck- 2021- s/p ACDF procedure, remote knee scope.   PAIN:  Are you having pain? No  PRECAUTIONS: Fall  WEIGHT BEARING RESTRICTIONS: No  FALLS:  Has patient fallen in last 6 months? No  PATIENT GOALS: Patient reports he wants to be able to walk again without a cane if possible.   NEXT MD VISIT: No follow up  OBJECTIVE:   Light touch: Impaired      TODAY'S TREATMENT:  12/02/22    Supine therex:  Bridge: with 3 sec hold x 10  Dead bug x 6 bil with 3 sec hold  Side plank with knees on bed 2 x 5 bil  SAQ x 12 bil with 3# ankle weight  Heel slide x 15 BLE  Hip flexion with 3# ankle weights - CGA for control of the RLE Qped: UE raise x 12 bil  Donkey kicks. X 10  3 # ankle weights  Standing hip flexor stretch x 30 sec bil.   Manual:  HS stretch x 40 sec bil  Gluteal stretch x 30 sec bil  Quad stretch x 40 sec bil  Hip flexor stretch x 30 sce bil   Gait through rehab gym without AD x 68ft with  supervision assist from PT, noted to drag BLE toes initially, but improved with increased distance.   PATIENT EDUCATION:  Education details: Pt educated throughout session about proper posture and technique with exercises. Improved exercise technique, movement at target joints, use of target muscles after min to mod verbal, visual, tactile cues.  Person educated: Patient Education method: Explanation, Demonstration, Tactile cues, and Verbal cues Education comprehension: verbalized understanding, returned demonstration, verbal cues required, tactile cues required, and needs further education  HOME EXERCISE PROGRAM: Access Code: [email or web address] URL: [email or web address] Date: 10/16/2022 Prepared by: Precious Bard Exercises - Standing Hip Flexor Stretch  - 1 x daily - 7 x weekly - 2 sets - 2 reps - 60 hold   Reviewed from last episode of care- will add to HEP as appropriate.   ASSESSMENT:  CLINICAL IMPRESSION:    Patient put forth excellent effort throughout PT treatment to address Bil hip tightness and weakness. Pt noted to tolerate increased demand on core through improved posture and technique through dead bug and plank movements. Functionally, noted to continue to drag BLE with gait following therex and manual therapy to address hip tightness and improve muscle extensibility .   Patient will continue to benefit from skilled therapy to address remaining deficits in order to improve overall QoL and return to PLOF.     OBJECTIVE IMPAIRMENTS: Abnormal gait, decreased activity tolerance, decreased balance, decreased endurance, decreased mobility, difficulty walking, decreased ROM, decreased strength, and pain.   ACTIVITY LIMITATIONS: carrying, lifting, bending, sitting, standing, squatting, stairs, transfers, and bed mobility  PARTICIPATION LIMITATIONS: cleaning, laundry, shopping, community activity, and yard work  PERSONAL FACTORS: Time since onset of injury/illness/exacerbation and  1-2 comorbidities: HTN, Arthritis, past cervical ACDF procedure in 2021  are also affecting patient's functional outcome.   REHAB POTENTIAL: Good  CLINICAL DECISION MAKING: Stable/uncomplicated  EVALUATION COMPLEXITY: Moderate   GOALS: Goals reviewed with  patient? Yes  SHORT TERM GOALS: Target date: 08/26/2022  Pt. Independent with HEP to increase B LE strength 1/2 muscle grade to improve standing/ independence with gait.  Baseline: See chart in objective, several increases but some remain the same  Goal status: IN PROGRESS  2.  Pt will decrease worst back pain as reported on NPRS by at least 2 points in order to demonstrate clinically significant reduction in back pain.  Baseline: EVAL= up to 8/10 at worst 08/14/22: 3/10 at worst  Goal status: MET   LONG TERM GOALS: Target date: 12/30/2022  Pt will improve FOTO to target score  of  60 or greater to display perceived improvements in ability to complete ADL's.  Baseline: EVAL= 47 5/8: 56 6/17: 56%; 10/07/2022= Did not administer today as patient was just assessed 2 weeks ago- 8/12:59  Goal status: IN PROGRESS  2.  Pt will decrease MODI score by at least 8 points in order demonstrate clinically significant reduction in back pain/disability.  Baseline: EVAL= 18 5/8: 10 6/17: 9/50  Goal status: MET  3.  Pt will decrease worst back pain as reported on NPRS by at least 3 points in order to demonstrate clinically significant reduction in back pain.  Baseline: EVAL= Up to 8/10 low back pain 08/14/22: 4/10 worst after work 6/1: 4/10; 10/07/2022- up to 5/10 after working 3 half days in a row.  8/12: pt reports back back 3/10 at worst after working for the last week   Goal status: MET  4.  Pt will improve FGA by at least 3 points in order to demonstrate clinically significant improvement in balance.   Baseline: EVAL: to be initiated next visit; 07/17/2022= 18/30 08/14/22:19/30 6/17:  20/30 Goal status: IN PROGRESS  5.  Pt will decrease 5TSTS by at  least 3 seconds in order to demonstrate clinically significant improvement in LE strength Baseline: EVAL= 19.23 sec  08/14/22:14.38 sec 8/12: 12.59 Goal status: MET  6.  Pt will increase by at least 0.13 m/s in order to demonstrate clinically significant improvement in community ambulation.   Baseline: EVAL= 0.65 m/s without UE support; 10/07/2022= 0.71 m/s 8/12. 0.71m/s Goal status:MET   7. Patient will improved 6 min walk test > 1100 feet  consistently for improved gait efficiency and improved overall gait endurance with community activities.  Baseline: 07/17/2022= 1050 feet with cane 5/8: 1230 ft no AD; 10/07/2022= 950 feet 8/12: 1130ft Goal status: MET   8. Patient will report/demonstrate independence with all car transfers/tub transfers and floor transfers to maximize his functional independence in home/community.  Baseline: 10/07/2022= Patient reports difficulty due  To pain/weakness at times with transfers 8/12: pt reports being able to stand from floor Modified independent; as long as he has UE support on something sturdy.  Goal status: in progress.   PLAN:  PT FREQUENCY: 1-2x/week  PT DURATION: 12 weeks  PLANNED INTERVENTIONS: Therapeutic exercises, Therapeutic activity, Neuromuscular re-education, Balance training, Gait training, Patient/Family education, Self Care, Joint mobilization, Joint manipulation, Stair training, Vestibular training, Canalith repositioning, Orthotic/Fit training, DME instructions, Dry Needling, Spinal manipulation, Spinal mobilization, Cryotherapy, Moist heat, Taping, and Manual therapy.  PLAN FOR NEXT SESSION:     Deep core activation LBP management.  R LE strengthening.   Grier Rocher PT, DPT  Physical Therapist - [place]  [facility]  3:40 PM 12/02/22

## 2022-12-03 ENCOUNTER — Other Ambulatory Visit: Payer: Self-pay | Admitting: Internal Medicine

## 2022-12-03 DIAGNOSIS — L309 Dermatitis, unspecified: Secondary | ICD-10-CM

## 2022-12-04 ENCOUNTER — Ambulatory Visit: Payer: Medicare Other | Admitting: Physical Therapy

## 2022-12-04 MED ORDER — NA SULFATE-K SULFATE-MG SULF 17.5-3.13-1.6 GM/177ML PO SOLN: 1.0000 | Freq: Once | ORAL | 0 refills | Status: AC

## 2022-12-04 NOTE — Addendum Note (Signed)
Addended by: Johnney Killian on: 12/04/2022 12:44 PM   Modules accepted: Orders

## 2022-12-04 NOTE — Telephone Encounter (Signed)
Called and spoke with pharmacist- patient ordered to have Suprep instead of Plenvu- price match to [employer] per pharmacist =$32.98;  Called and spoke with patient- patient reports he is unable to proceed with his recall colon due to finances- patient informed to call back to the office when is able to reschedule colon; Patient verbalized understanding of information/instructions;  Patient advised to call back to the office at [phone] should questions/concerns arise;

## 2022-12-11 ENCOUNTER — Ambulatory Visit: Payer: Medicare Other | Attending: Internal Medicine | Admitting: Physical Therapy

## 2022-12-11 DIAGNOSIS — R269 Unspecified abnormalities of gait and mobility: Secondary | ICD-10-CM | POA: Diagnosis not present

## 2022-12-11 DIAGNOSIS — G8929 Other chronic pain: Secondary | ICD-10-CM | POA: Insufficient documentation

## 2022-12-11 DIAGNOSIS — R262 Difficulty in walking, not elsewhere classified: Secondary | ICD-10-CM | POA: Insufficient documentation

## 2022-12-11 DIAGNOSIS — M545 Low back pain, unspecified: Secondary | ICD-10-CM | POA: Insufficient documentation

## 2022-12-11 DIAGNOSIS — M2560 Stiffness of unspecified joint, not elsewhere classified: Secondary | ICD-10-CM | POA: Diagnosis not present

## 2022-12-11 DIAGNOSIS — R278 Other lack of coordination: Secondary | ICD-10-CM | POA: Diagnosis not present

## 2022-12-11 DIAGNOSIS — M6281 Muscle weakness (generalized): Secondary | ICD-10-CM | POA: Diagnosis not present

## 2022-12-11 DIAGNOSIS — R2681 Unsteadiness on feet: Secondary | ICD-10-CM | POA: Diagnosis not present

## 2022-12-11 NOTE — Therapy (Signed)
OUTPATIENT PHYSICAL THERAPY THORACOLUMBAR TREATMENT    Patient Name: Larry Blair MRN: [id number] DOB:[birth year], 67 y.o., male Today's Date: 12/11/2022  END OF SESSION:  PT End of Session - 12/11/22 1448     Visit Number 36    Number of Visits 48    Date for PT Re-Evaluation 12/30/22    Authorization Type BCBS Medicare    Progress Note Due on Visit 40    PT Start Time 1404    PT Stop Time 1445    PT Time Calculation (min) 41 min    Equipment Utilized During Treatment Gait belt    Activity Tolerance Patient tolerated treatment well;No increased pain    Behavior During Therapy WFL for tasks assessed/performed                  Past Medical History:  Diagnosis Date   Allergy    Arthritis    bilateral shoulders/LEFT knee   Asthma    Eczema    HTN (hypertension)    Hyperlipidemia    Substance abuse (HCC) 1976   daily   Past Surgical History:  Procedure Laterality Date   ANTERIOR CERVICAL DECOMP/DISCECTOMY FUSION N/A 08/30/2019   Procedure: CERVICAL THREE-FOUR ANTERIOR CERVICAL DECOMPRESSION/FUSION;  Surgeon: Tia Alert, MD;  Location: [facility];  Service: Neurosurgery;  Laterality: N/A;   COLONOSCOPY  09/2017   HD-MAC-Plenvu (exc)-hems   KNEE ARTHROSCOPY Left    WISDOM TOOTH EXTRACTION     Patient Active Problem List   Diagnosis Date Noted   Encounter for general adult medical examination with abnormal findings 07/04/2022   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 Sanda Linger  REFERRING PROVIDER: Dr. Sanda Linger  REFERRING DIAG: Chronic bilateral Low back pain without Sciatica  Rationale for Evaluation and Treatment: Rehabilitation  THERAPY DIAG:  Muscle weakness (generalized)  Unsteadiness on feet  Difficulty in walking, not elsewhere classified  Other lack of coordination  Difficulty in walking  Abnormality of gait and mobility  Joint stiffness  Chronic bilateral low back pain without sciatica  ONSET DATE: 08/27/2022  SUBJECTIVE:  SUBJECTIVE STATEMENT:   Patient reports that he is feeling really good on this day. States that he was able to work all weekend without increaed back pain. States that if he could improve strength in the RLE, he would be ready to d/c from PT for a little while.   PERTINENT HISTORY:  Fall with neck surgery - started it all - damaged vertebrae in my neck- 2021- s/p ACDF procedure, remote knee scope.   PAIN:  Are you having pain? No  PRECAUTIONS: Fall  WEIGHT BEARING RESTRICTIONS: No  FALLS:  Has patient fallen in last 6 months? No  PATIENT GOALS: Patient reports he wants to be able to walk again without a cane if possible.   NEXT MD VISIT: No follow up  OBJECTIVE:   Light touch: Impaired      TODAY'S TREATMENT:  12/11/22  Nustep BLE and BUE x 5 min level 1-4 with cues for SPM>55 throughout through variable resistance for endurance training aerobic priming.   PT donned 4# ankle weights and gait belt for safety. CGA for standing therex for safety.  Sit<>stand x 10 without UE support  Standing at rail:  Hip abduction x 15  Hip extension x 12 HS curl x 12 bil  Hip flexion x 12 bil  Standing ankle DF x 20  Weighted gait 2 x 140ft with cues for posture and improved heel contact. Pt demonstrating improved posture and step length compared to prior  sessions with this PT.   Forward/reverse gait with 4# ankle weights 10ft x 4 with tactile cues for posture. Improved step length with increased repetitions throughout.   Seated deep core activation to hold GTB at midline while PT applied variable  resistance  3 x 30 sec bil.     PATIENT EDUCATION:  Education details: Pt educated throughout session about proper posture and technique with exercises. Improved exercise technique, movement at target joints, use of target muscles after min to mod verbal, visual, tactile cues.  Person educated: Patient Education method: Explanation, Demonstration, Tactile cues, and Verbal cues Education comprehension: verbalized understanding, returned demonstration, verbal cues required, tactile cues required, and needs further education  HOME EXERCISE PROGRAM: Access Code: [email or web address] URL: [email or web address] Date: 10/16/2022 Prepared by: Precious Bard Exercises - Standing Hip Flexor Stretch  - 1 x daily - 7 x weekly - 2 sets - 2 reps - 60 hold   Reviewed from last episode of care- will add to HEP as appropriate.   ASSESSMENT:  CLINICAL IMPRESSION:    Patient put forth excellent effort throughout PT treatment on this day. No pain or tightness reported by Pt, therefore, PT treatment focused on BLE strength and core activation. Pt demonstrating improved step through gait pattern with at least foot flat contact and intermittent heel contact with and without resistance. Patient will continue to benefit from skilled therapy to address remaining deficits in order to improve overall QoL and return to PLOF.     OBJECTIVE IMPAIRMENTS: Abnormal gait, decreased activity tolerance, decreased balance, decreased endurance, decreased mobility, difficulty walking, decreased ROM, decreased strength, and pain.   ACTIVITY LIMITATIONS: carrying, lifting, bending, sitting, standing, squatting, stairs, transfers, and bed mobility  PARTICIPATION LIMITATIONS: cleaning,  laundry, shopping, community activity, and yard work  PERSONAL FACTORS: Time since onset of injury/illness/exacerbation and 1-2 comorbidities: HTN, Arthritis, past cervical ACDF procedure in 2021  are also affecting patient's functional outcome.   REHAB POTENTIAL: Good  CLINICAL DECISION MAKING: Stable/uncomplicated  EVALUATION COMPLEXITY: Moderate   GOALS: Goals reviewed with patient?  Yes  SHORT TERM GOALS: Target date: 08/26/2022  Pt. Independent with HEP to increase B LE strength 1/2 muscle grade to improve standing/ independence with gait.  Baseline: See chart in objective, several increases but some remain the same  Goal status: IN PROGRESS  2.  Pt will decrease worst back pain as reported on NPRS by at least 2 points in order to demonstrate clinically significant reduction in back pain.  Baseline: EVAL= up to 8/10 at worst 08/14/22: 3/10 at worst  Goal status: MET   LONG TERM GOALS: Target date: 12/30/2022  Pt will improve FOTO to target score  of  60 or greater to display perceived improvements in ability to complete ADL's.  Baseline: EVAL= 47 5/8: 56 6/17: 56%; 10/07/2022= Did not administer today as patient was just assessed 2 weeks ago- 8/12:59  Goal status: IN PROGRESS  2.  Pt will decrease MODI score by at least 8 points in order demonstrate clinically significant reduction in back pain/disability.  Baseline: EVAL= 18 5/8: 10 6/17: 9/50  Goal status: MET  3.  Pt will decrease worst back pain as reported on NPRS by at least 3 points in order to demonstrate clinically significant reduction in back pain.  Baseline: EVAL= Up to 8/10 low back pain 08/14/22: 4/10 worst after work 6/1: 4/10; 10/07/2022- up to 5/10 after working 3 half days in a row.  8/12: pt reports back back 3/10 at worst after working for the last week   Goal status: MET  4.  Pt will improve FGA by at least 3 points in order to demonstrate clinically significant improvement in balance.   Baseline: EVAL: to be  initiated next visit; 07/17/2022= 18/30 08/14/22:19/30 6/17:  20/30 Goal status: IN PROGRESS  5.  Pt will decrease 5TSTS by at least 3 seconds in order to demonstrate clinically significant improvement in LE strength Baseline: EVAL= 19.23 sec  08/14/22:14.38 sec 8/12: 12.59 Goal status: MET  6.  Pt will increase by at least 0.13 m/s in order to demonstrate clinically significant improvement in community ambulation.   Baseline: EVAL= 0.65 m/s without UE support; 10/07/2022= 0.71 m/s 8/12. 0.4m/s Goal status:MET   7. Patient will improved 6 min walk test > 1100 feet  consistently for improved gait efficiency and improved overall gait endurance with community activities.  Baseline: 07/17/2022= 1050 feet with cane 5/8: 1230 ft no AD; 10/07/2022= 950 feet 8/12: 1169ft Goal status: MET   8. Patient will report/demonstrate independence with all car transfers/tub transfers and floor transfers to maximize his functional independence in home/community.  Baseline: 10/07/2022= Patient reports difficulty due  To pain/weakness at times with transfers 8/12: pt reports being able to stand from floor Modified independent; as long as he has UE support on something sturdy.  Goal status: in progress.   PLAN:  PT FREQUENCY: 1-2x/week  PT DURATION: 12 weeks  PLANNED INTERVENTIONS: Therapeutic exercises, Therapeutic activity, Neuromuscular re-education, Balance training, Gait training, Patient/Family education, Self Care, Joint mobilization, Joint manipulation, Stair training, Vestibular training, Canalith repositioning, Orthotic/Fit training, DME instructions, Dry Needling, Spinal manipulation, Spinal mobilization, Cryotherapy, Moist heat, Taping, and Manual therapy.  PLAN FOR NEXT SESSION:    BLE strengthening and endurance    Grier Rocher PT, DPT  Physical Therapist - [place]  [facility]  2:53 PM 12/11/22

## 2022-12-16 ENCOUNTER — Ambulatory Visit: Payer: Medicare Other

## 2022-12-16 DIAGNOSIS — R262 Difficulty in walking, not elsewhere classified: Secondary | ICD-10-CM

## 2022-12-16 DIAGNOSIS — R278 Other lack of coordination: Secondary | ICD-10-CM

## 2022-12-16 DIAGNOSIS — M545 Low back pain, unspecified: Secondary | ICD-10-CM | POA: Diagnosis not present

## 2022-12-16 DIAGNOSIS — R269 Unspecified abnormalities of gait and mobility: Secondary | ICD-10-CM | POA: Diagnosis not present

## 2022-12-16 DIAGNOSIS — M6281 Muscle weakness (generalized): Secondary | ICD-10-CM

## 2022-12-16 DIAGNOSIS — M2560 Stiffness of unspecified joint, not elsewhere classified: Secondary | ICD-10-CM | POA: Diagnosis not present

## 2022-12-16 DIAGNOSIS — R2681 Unsteadiness on feet: Secondary | ICD-10-CM | POA: Diagnosis not present

## 2022-12-16 DIAGNOSIS — G8929 Other chronic pain: Secondary | ICD-10-CM | POA: Diagnosis not present

## 2022-12-16 NOTE — Therapy (Signed)
OUTPATIENT PHYSICAL THERAPY TREATMENT   Patient Name: Larry Blair MRN: [id number] DOB:December 07, 1955, 67 y.o., male Today's Date: 12/16/2022  END OF SESSION:  PT End of Session - 12/16/22 1454     Visit Number 37    Number of Visits 48    Date for PT Re-Evaluation 12/30/22    Authorization Type BCBS Medicare    Authorization Time Period 10/07/22-12/30/22    Progress Note Due on Visit 40    PT Start Time 1450    PT Stop Time 1529    PT Time Calculation (min) 39 min    Equipment Utilized During Treatment Gait belt    Activity Tolerance Patient tolerated treatment well;No increased pain    Behavior During Therapy WFL for tasks assessed/performed              Past Medical History:  Diagnosis Date   Allergy    Arthritis    bilateral shoulders/LEFT knee   Asthma    Eczema    HTN (hypertension)    Hyperlipidemia    Substance abuse (HCC) 1976   daily   Past Surgical History:  Procedure Laterality Date   ANTERIOR CERVICAL DECOMP/DISCECTOMY FUSION N/A 08/30/2019   Procedure: CERVICAL THREE-FOUR ANTERIOR CERVICAL DECOMPRESSION/FUSION;  Surgeon: Tia Alert, MD;  Location: [facility] OR;  Service: Neurosurgery;  Laterality: N/A;   COLONOSCOPY  09/2017   HD-MAC-Plenvu (exc)-hems   KNEE ARTHROSCOPY Left    WISDOM TOOTH EXTRACTION     Patient Active Problem List   Diagnosis Date Noted   Encounter for general adult medical examination with abnormal findings 07/04/2022   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 Sanda Linger  REFERRING  PROVIDER: Dr. Sanda Linger  REFERRING DIAG: Chronic bilateral Low back pain without Sciatica  Rationale for Evaluation and Treatment: Rehabilitation  THERAPY DIAG:  Muscle weakness (generalized)  Unsteadiness on feet  Difficulty in walking, not elsewhere classified  Other lack of coordination  ONSET DATE: 08/27/2022  SUBJECTIVE:                                                                                                                                                                                           SUBJECTIVE STATEMENT:  Pt reports continued  progressive improvements in tolerance and mobility in the workplace 3x/week. No pain today on arrival.   PERTINENT HISTORY:  Fall with neck surgery - started it all - damaged vertebrae in my neck- 2021- s/p ACDF procedure, remote knee scope.   PAIN:  Are you having pain? No  PRECAUTIONS: Fall  WEIGHT BEARING RESTRICTIONS: No  FALLS:  Has patient fallen in last 6 months? No  PATIENT GOALS: Patient reports he wants to be able to walk again without a cane if possible.   NEXT MD VISIT: No follow up  OBJECTIVE:    TODAY'S TREATMENT:  12/16/22  -Nustep BLE and BUE, seat 11, arms 11, Level 3 x 4 minutes   -STS from chair with orange weight ball x10  -lateral side stepping in // bars 4 bilat (hands free, no LOB) -retro/forward stepping c orange weight ball x4 each (hands free, no LOB)  -AMB HIT training 8x 33ft c 5lb AW bilat    PATIENT EDUCATION:  Education details: Pt educated throughout session about proper posture and technique with exercises. Improved exercise technique, movement at target joints, use of target muscles after min to mod verbal, visual, tactile cues.  Person educated: Patient Education method: Explanation, Demonstration, Tactile cues, and Verbal cues Education comprehension: verbalized understanding, returned demonstration, verbal cues required, tactile cues required, and needs further  education  HOME EXERCISE PROGRAM: Access Code: [email or web address] URL: [email or web address] Date: 10/16/2022 Prepared by: Precious Bard Exercises - Standing Hip Flexor Stretch  - 1 x daily - 7 x weekly - 2 sets - 2 reps - 60 hold   Reviewed from last episode of care- will add to HEP as appropriate.   ASSESSMENT:  CLINICAL IMPRESSION:   Continued to work on Curator activity. Pt continues to demonstrate progress goals, many already achieved. Discussed up and coming 40th visit and asked pt to consider adding in more 10 minute walking into weekly routine. Patient will continue to benefit from skilled therapy to address remaining deficits in order to improve overall QoL and return to PLOF.     OBJECTIVE IMPAIRMENTS: Abnormal gait, decreased activity tolerance, decreased balance, decreased endurance, decreased mobility, difficulty walking, decreased ROM, decreased strength, and pain.   ACTIVITY LIMITATIONS: carrying, lifting, bending, sitting, standing, squatting, stairs, transfers, and bed mobility  PARTICIPATION LIMITATIONS: cleaning, laundry, shopping, community activity, and yard work  PERSONAL FACTORS: Time since onset of injury/illness/exacerbation and 1-2 comorbidities: HTN, Arthritis, past cervical ACDF procedure in 2021  are also affecting patient's functional outcome.   REHAB POTENTIAL: Good  CLINICAL DECISION MAKING: Stable/uncomplicated  EVALUATION COMPLEXITY: Moderate   GOALS: Goals reviewed with patient? Yes  SHORT TERM GOALS: Target date: 08/26/2022  Pt. Independent with HEP to increase B LE strength 1/2 muscle grade to improve standing/ independence with gait.  Baseline: See chart in objective, several increases but some remain the same  Goal status: IN PROGRESS  2.  Pt will decrease worst back pain as reported on NPRS by at least 2 points in order to demonstrate clinically significant reduction in back pain.  Baseline: EVAL= up to 8/10 at worst  08/14/22: 3/10 at worst  Goal status: MET   LONG TERM GOALS: Target date: 12/30/2022  Pt will improve FOTO to target score  of  60 or greater to display perceived improvements in ability to complete ADL's.  Baseline: EVAL= 47 5/8: 56 6/17: 56%; 10/07/2022= Did not administer today as patient was just assessed 2 weeks ago- 8/12:59  Goal status: IN PROGRESS  2.  Pt will decrease MODI score by at least 8 points in order demonstrate clinically significant reduction in back pain/disability.  Baseline: EVAL= 18 5/8: 10 6/17: 9/50  Goal status: MET  3.  Pt will decrease worst back pain as reported on NPRS by at least 3 points in order to demonstrate clinically significant reduction in back pain.  Baseline: EVAL= Up to 8/10 low back pain 08/14/22: 4/10 worst after work 6/1: 4/10; 10/07/2022- up to 5/10 after working 3 half days in a row.  8/12: pt reports back back 3/10 at worst after working for the last week;    Goal status: MET  4.  Pt will improve FGA by at least 3 points in order to demonstrate clinically significant improvement in balance.   Baseline: EVAL: to be initiated next visit; 07/17/2022= 18/30 08/14/22:19/30 6/17:  20/30 Goal status: IN PROGRESS  5.  Pt will decrease 5TSTS by at least 3 seconds in order to demonstrate clinically significant improvement in LE strength Baseline: EVAL= 19.23 sec  08/14/22:14.38 sec 8/12: 12.59; Goal status: MET  6.  Pt will increase by at least 0.13 m/s in order to demonstrate clinically significant improvement in community ambulation.   Baseline: EVAL= 0.65 m/s without UE support; 10/07/2022= 0.71 m/s; 8/12. 0.15m/s Goal status:MET  7. Patient will improved 6 min walk test > 1100 feet  consistently for improved gait efficiency and improved overall gait endurance with community activities.  Baseline: 07/17/2022= 1050 feet with cane 5/8: 1230 ft no AD; 10/07/2022= 950 feet 8/12: 1113ft;  Goal status: MET  8. Patient will report/demonstrate independence with  all car transfers/tub transfers and floor transfers to maximize his functional independence in home/community.  Baseline: 10/07/2022= Patient reports difficulty due to pain/weakness at times with transfers 8/12: pt reports being able to stand from floor Modified independent; as long as he has UE support on something sturdy.  Goal status: in progress.   PLAN:  PT FREQUENCY: 1-2x/week  PT DURATION: 12 weeks  PLANNED INTERVENTIONS: Therapeutic exercises, Therapeutic activity, Neuromuscular re-education, Balance training, Gait training, Patient/Family education, Self Care, Joint mobilization, Joint manipulation, Stair training, Vestibular training, Canalith repositioning, Orthotic/Fit training, DME instructions, Dry Needling, Spinal manipulation, Spinal mobilization, Cryotherapy, Moist heat, Taping, and Manual therapy.    2:55 PM, 12/16/22 Rosamaria Lints, PT, DPT Physical Therapist - [place] [facility]  Outpatient Physical Therapy- Main Campus [phone]

## 2022-12-18 ENCOUNTER — Ambulatory Visit: Payer: Medicare Other | Admitting: Physical Therapy

## 2022-12-19 ENCOUNTER — Encounter: Payer: Medicare Other | Admitting: Gastroenterology

## 2022-12-23 ENCOUNTER — Ambulatory Visit: Payer: Medicare Other

## 2022-12-23 DIAGNOSIS — M545 Low back pain, unspecified: Secondary | ICD-10-CM | POA: Diagnosis not present

## 2022-12-23 DIAGNOSIS — R262 Difficulty in walking, not elsewhere classified: Secondary | ICD-10-CM | POA: Diagnosis not present

## 2022-12-23 DIAGNOSIS — R278 Other lack of coordination: Secondary | ICD-10-CM

## 2022-12-23 DIAGNOSIS — R269 Unspecified abnormalities of gait and mobility: Secondary | ICD-10-CM | POA: Diagnosis not present

## 2022-12-23 DIAGNOSIS — M6281 Muscle weakness (generalized): Secondary | ICD-10-CM | POA: Diagnosis not present

## 2022-12-23 DIAGNOSIS — R2681 Unsteadiness on feet: Secondary | ICD-10-CM

## 2022-12-23 DIAGNOSIS — M2560 Stiffness of unspecified joint, not elsewhere classified: Secondary | ICD-10-CM

## 2022-12-23 DIAGNOSIS — G8929 Other chronic pain: Secondary | ICD-10-CM | POA: Diagnosis not present

## 2022-12-23 NOTE — Therapy (Signed)
OUTPATIENT PHYSICAL THERAPY TREATMENT   Patient Name: Larry Blair MRN: [id number] DOB:[birth year], 67 y.o., male Today's Date: 12/23/2022  END OF SESSION:  PT End of Session - 12/23/22 1319     Visit Number 38    Number of Visits 48    Date for PT Re-Evaluation 12/30/22    Authorization Type BCBS Medicare    Authorization Time Period 10/07/22-12/30/22    Progress Note Due on Visit 40    PT Start Time 1319    PT Stop Time 1400    PT Time Calculation (min) 41 min    Equipment Utilized During Treatment Gait belt    Activity Tolerance Patient tolerated treatment well;No increased pain    Behavior During Therapy WFL for tasks assessed/performed               Past Medical History:  Diagnosis Date   Allergy    Arthritis    bilateral shoulders/LEFT knee   Asthma    Eczema    HTN (hypertension)    Hyperlipidemia    Substance abuse (HCC) 1976   daily   Past Surgical History:  Procedure Laterality Date   ANTERIOR CERVICAL DECOMP/DISCECTOMY FUSION N/A 08/30/2019   Procedure: CERVICAL THREE-FOUR ANTERIOR CERVICAL DECOMPRESSION/FUSION;  Surgeon: Tia Alert, MD;  Location: [facility] OR;  Service: Neurosurgery;  Laterality: N/A;   COLONOSCOPY  09/2017   HD-MAC-Plenvu (exc)-hems   KNEE ARTHROSCOPY Left    WISDOM TOOTH EXTRACTION     Patient Active Problem List   Diagnosis Date Noted   Encounter for general adult medical examination with abnormal findings 07/04/2022   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 Sanda Linger  REFERRING  PROVIDER: Dr. Sanda Linger  REFERRING DIAG: Chronic bilateral Low back pain without Sciatica  Rationale for Evaluation and Treatment: Rehabilitation  THERAPY DIAG:  Muscle weakness (generalized)  Unsteadiness on feet  Difficulty in walking, not elsewhere classified  Other lack of coordination  Difficulty in walking  Abnormality of gait and mobility  Joint stiffness  Chronic bilateral low back pain without sciatica  ONSET DATE: 08/27/2022  SUBJECTIVE:  SUBJECTIVE STATEMENT:   Pt has been able to work more frequently and has been doing well.  Pt notes he was able to visit some people with Fannie Knee that they haven't been able to see in a while.  PERTINENT HISTORY:  Fall with neck surgery - started it all - damaged vertebrae in my neck- 2021- s/p ACDF procedure, remote knee scope.   PAIN:  Are you having pain? No  PRECAUTIONS: Fall  WEIGHT BEARING RESTRICTIONS: No  FALLS:  Has patient fallen in last 6 months? No  PATIENT GOALS: Patient reports he wants to be able to walk again without a cane if possible.   NEXT MD VISIT: No follow up  OBJECTIVE:    TODAY'S TREATMENT:  12/23/22  TherEx:  Nustep BLE and BUE, seat 11, arms 11, Level 3 x 4 minutes    STS from chair with orange weight ball 2x10  Seated leg press, 55#, 2x10 Seated leg extension at Matrix, 7.5# x10, 5# (2.5# plate and 4.0# AW) x10 due to the 7.5# being too difficult Seated hamstring curls at Matrix, 7.5#, 2x10 each LE Lateral side stepping in // bars 4 bilat (hands free, no LOB) Lateral side stepping with squat in between lateral steps, x4 total laps  Lunges the length of // bars, intermittent UE assistance for balance, down/back x4   PATIENT EDUCATION:  Education details: Pt educated throughout session about proper  posture and technique with exercises. Improved exercise technique, movement at target joints, use of target muscles after min to mod verbal, visual, tactile cues.  Person educated: Patient Education method: Explanation, Demonstration, Tactile cues, and Verbal cues Education comprehension: verbalized understanding, returned demonstration, verbal cues required, tactile cues required, and needs further education  HOME EXERCISE PROGRAM: Access Code: [email or web address] URL: [email or web address] Date: 10/16/2022 Prepared by: Precious Bard Exercises - Standing Hip Flexor Stretch  - 1 x daily - 7 x weekly - 2 sets - 2 reps - 60 hold   Reviewed from last episode of care- will add to HEP as appropriate.   ASSESSMENT:  CLINICAL IMPRESSION:    Pt put forth great effort throughout the session and noted that he experienced a "good workout today".  Pt introduced to new strengthening exercises that targeted the LE's in order to improve balance and mobility.  Pt demonstrated good technique with the seated exercises and they were utilized to target specific muscle groups of the LE for targeted strengthening.   Pt will continue to benefit from skilled therapy to address remaining deficits in order to improve overall QoL and return to PLOF.      OBJECTIVE IMPAIRMENTS: Abnormal gait, decreased activity tolerance, decreased balance, decreased endurance, decreased mobility, difficulty walking, decreased ROM, decreased strength, and pain.   ACTIVITY LIMITATIONS: carrying, lifting, bending, sitting, standing, squatting, stairs, transfers, and bed mobility  PARTICIPATION LIMITATIONS: cleaning, laundry, shopping, community activity, and yard work  PERSONAL FACTORS: Time since onset of injury/illness/exacerbation and 1-2 comorbidities: HTN, Arthritis, past cervical ACDF procedure in 2021  are also affecting patient's functional outcome.   REHAB POTENTIAL: Good  CLINICAL DECISION MAKING:  Stable/uncomplicated  EVALUATION COMPLEXITY: Moderate   GOALS: Goals reviewed with patient? Yes  SHORT TERM GOALS: Target date: 08/26/2022  Pt. Independent with HEP to increase B LE strength 1/2 muscle grade to improve standing/ independence with gait.  Baseline: See chart in objective, several increases but some remain the same  Goal status: IN PROGRESS  2.  Pt will decrease worst back pain as reported on NPRS by at  least 2 points in order to demonstrate clinically significant reduction in back pain.  Baseline: EVAL= up to 8/10 at worst 08/14/22: 3/10 at worst  Goal status: MET   LONG TERM GOALS: Target date: 12/30/2022  Pt will improve FOTO to target score  of  60 or greater to display perceived improvements in ability to complete ADL's.  Baseline: EVAL= 47 5/8: 56 6/17: 56%; 10/07/2022= Did not administer today as patient was just assessed 2 weeks ago- 8/12:59  Goal status: IN PROGRESS  2.  Pt will decrease MODI score by at least 8 points in order demonstrate clinically significant reduction in back pain/disability.  Baseline: EVAL= 18 5/8: 10 6/17: 9/50  Goal status: MET  3.  Pt will decrease worst back pain as reported on NPRS by at least 3 points in order to demonstrate clinically significant reduction in back pain.  Baseline: EVAL= Up to 8/10 low back pain 08/14/22: 4/10 worst after work 6/1: 4/10; 10/07/2022- up to 5/10 after working 3 half days in a row.  8/12: pt reports back back 3/10 at worst after working for the last week;    Goal status: MET  4.  Pt will improve FGA by at least 3 points in order to demonstrate clinically significant improvement in balance.   Baseline: EVAL: to be initiated next visit; 07/17/2022= 18/30 08/14/22:19/30 6/17:  20/30 Goal status: IN PROGRESS  5.  Pt will decrease 5TSTS by at least 3 seconds in order to demonstrate clinically significant improvement in LE strength Baseline: EVAL= 19.23 sec  08/14/22:14.38 sec 8/12: 12.59; Goal status: MET  6.  Pt  will increase by at least 0.13 m/s in order to demonstrate clinically significant improvement in community ambulation.   Baseline: EVAL= 0.65 m/s without UE support; 10/07/2022= 0.71 m/s; 8/12. 0.37m/s Goal status:MET  7. Patient will improved 6 min walk test > 1100 feet  consistently for improved gait efficiency and improved overall gait endurance with community activities.  Baseline: 07/17/2022= 1050 feet with cane 5/8: 1230 ft no AD; 10/07/2022= 950 feet 8/12: 1130ft;  Goal status: MET  8. Patient will report/demonstrate independence with all car transfers/tub transfers and floor transfers to maximize his functional independence in home/community.  Baseline: 10/07/2022= Patient reports difficulty due to pain/weakness at times with transfers 8/12: pt reports being able to stand from floor Modified independent; as long as he has UE support on something sturdy.  Goal status: in progress.   PLAN:  PT FREQUENCY: 1-2x/week  PT DURATION: 12 weeks  PLANNED INTERVENTIONS: Therapeutic exercises, Therapeutic activity, Neuromuscular re-education, Balance training, Gait training, Patient/Family education, Self Care, Joint mobilization, Joint manipulation, Stair training, Vestibular training, Canalith repositioning, Orthotic/Fit training, DME instructions, Dry Needling, Spinal manipulation, Spinal mobilization, Cryotherapy, Moist heat, Taping, and Manual therapy.   Nolon Bussing, PT, DPT Physical Therapist - [facility]  12/23/22, 3:34 PM

## 2022-12-24 ENCOUNTER — Ambulatory Visit: Payer: Medicare Other

## 2022-12-24 DIAGNOSIS — Z23 Encounter for immunization: Secondary | ICD-10-CM | POA: Diagnosis not present

## 2022-12-24 NOTE — Progress Notes (Signed)
High dose flu vax given.  Pt tolerated well. Pt is aware to give the office a call for an side effects or reactions. Please co-sign.

## 2022-12-25 ENCOUNTER — Ambulatory Visit: Payer: Medicare Other | Admitting: Physical Therapy

## 2022-12-30 ENCOUNTER — Ambulatory Visit: Payer: Medicare Other | Admitting: Physical Therapy

## 2022-12-30 DIAGNOSIS — R2681 Unsteadiness on feet: Secondary | ICD-10-CM

## 2022-12-30 DIAGNOSIS — M6281 Muscle weakness (generalized): Secondary | ICD-10-CM

## 2022-12-30 DIAGNOSIS — R278 Other lack of coordination: Secondary | ICD-10-CM | POA: Diagnosis not present

## 2022-12-30 DIAGNOSIS — R262 Difficulty in walking, not elsewhere classified: Secondary | ICD-10-CM

## 2022-12-30 DIAGNOSIS — M2560 Stiffness of unspecified joint, not elsewhere classified: Secondary | ICD-10-CM | POA: Diagnosis not present

## 2022-12-30 DIAGNOSIS — R269 Unspecified abnormalities of gait and mobility: Secondary | ICD-10-CM | POA: Diagnosis not present

## 2022-12-30 DIAGNOSIS — G8929 Other chronic pain: Secondary | ICD-10-CM | POA: Diagnosis not present

## 2022-12-30 DIAGNOSIS — M545 Low back pain, unspecified: Secondary | ICD-10-CM | POA: Diagnosis not present

## 2022-12-30 NOTE — Therapy (Unsigned)
OUTPATIENT PHYSICAL THERAPY TREATMENT/  Re-certification.    Patient Name: Larry Blair MRN: [id number] DOB:[birth year], 67 y.o., male Today's Date: 12/30/2022  END OF SESSION:  PT End of Session - 12/30/22 1316     Visit Number 39    Number of Visits 55    Date for PT Re-Evaluation 02/25/23    Authorization Type BCBS Medicare    Authorization Time Period 10/07/22-12/30/22    Progress Note Due on Visit 40    PT Start Time 1316    PT Stop Time 1356    PT Time Calculation (min) 40 min    Equipment Utilized During Treatment Gait belt    Activity Tolerance Patient tolerated treatment well;No increased pain    Behavior During Therapy WFL for tasks assessed/performed               Past Medical History:  Diagnosis Date   Allergy    Arthritis    bilateral shoulders/LEFT knee   Asthma    Eczema    HTN (hypertension)    Hyperlipidemia    Substance abuse (HCC) 1976   daily   Past Surgical History:  Procedure Laterality Date   ANTERIOR CERVICAL DECOMP/DISCECTOMY FUSION N/A 08/30/2019   Procedure: CERVICAL THREE-FOUR ANTERIOR CERVICAL DECOMPRESSION/FUSION;  Surgeon: Tia Alert, MD;  Location: [facility] OR;  Service: Neurosurgery;  Laterality: N/A;   COLONOSCOPY  09/2017   HD-MAC-Plenvu (exc)-hems   KNEE ARTHROSCOPY Left    WISDOM TOOTH EXTRACTION     Patient Active Problem List   Diagnosis Date Noted   Encounter for general adult medical examination with abnormal findings 07/04/2022   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  Sanda Linger  REFERRING PROVIDER: Dr. Sanda Linger  REFERRING DIAG: Chronic bilateral Low back pain without Sciatica  Rationale for Evaluation and Treatment: Rehabilitation  THERAPY DIAG:  Muscle weakness (generalized)  Difficulty in walking, not elsewhere classified  Other lack of coordination  Abnormality of gait and mobility  Difficulty in walking  Unsteadiness on feet  Joint stiffness  Chronic bilateral low back pain without sciatica  ONSET DATE: 08/27/2022  SUBJECTIVE:  SUBJECTIVE STATEMENT:   Pt has been able to work more frequently and has been doing well.  Pt notes he was able to visit some people with Fannie Knee that they haven't been able to see in a while.  PERTINENT HISTORY:  Fall with neck surgery - started it all - damaged vertebrae in my neck- 2021- s/p ACDF procedure, remote knee scope.   PAIN:  Are you having pain? No  PRECAUTIONS: Fall  WEIGHT BEARING RESTRICTIONS: No  FALLS:  Has patient fallen in last 6 months? No  PATIENT GOALS: Patient reports he wants to be able to walk again without a cane if possible.   NEXT MD VISIT: No follow up  OBJECTIVE:   LOWER EXTREMITY ROM:      All B LE ROM= WNL     LOWER EXTREMITY MMT:     MMT Right eval Left eval R 5/8 L5/8    Hip flexion 3+ 3+ 3+ 4- 4- 4-  Hip extension 3+ 3+        Hip abduction 3+ 3+        Hip adduction 4 4 4 4  4+ 4+  Hip internal rotation 3+ 3+ 3+ 3+    Hip external rotation 3+ 3+ 4- 4-    Knee flexion 4 4 4 4 4 4   Knee extension 4 4 4+ 4+ 4+ 4+  Ankle dorsiflexion 4 4 4+ 4+ 4+ 4+  Ankle plantarflexion 4 4 4 4 4 4   Ankle inversion 4 4 4 4     Ankle eversion 4 4 4 4      (Blank rows = not tested)  TODAY'S TREATMENT:  12/30/22 PT treatment comprised of Goal assessment and revision to Re  certification.  6 Min Walk Test:  Instructed patient to ambulate as quickly and as safely as possible for 6 minutes using LRAD. Patient was allowed to take standing rest breaks without stopping the test, but if the patient required a sitting rest break the clock would be stopped and the test would be over.  Results: 1216ft no AD   using a no AD with supervision assist. Results indicate that the patient has reduced endurance with ambulation compared to age matched norms.  Age Matched Norms: 64-69 yo M: 55 F: 24, 77-79 yo M: 11 F: 471, 71-89 yo M: 417 F: 392 MDC: 58.21 meters (190.98 feet) or 50 meters (ANPTA Core Set of Outcome Measures for Adults with Neurologic Conditions, 2018)    10 Meter Walk Test: Patient instructed to walk 10 meters (32.8 ft) as quickly and as safely as possible at their normal speed x2 and at a fast speed x2. Time measured from 2 meter mark to 8 meter mark to accommodate ramp-up and ramp-down.  Average Normal speed: 0.89 m/s Average Fast speed: 1.02 m/s Cut off scores: <0.4 m/s = household Ambulator, 0.4-0.8 m/s = limited community Ambulator, >0.8 m/s = community Ambulator, >1.2 m/s = crossing a street, <1.0 = increased fall risk MCID 0.05 m/s (small), 0.13 m/s (moderate), 0.06 m/s (significant)  (ANPTA Core Set of Outcome Measures for Adults with Neurologic Conditions, 2018)  Patient demonstrates increased fall risk as noted by score of 24/30 on  Functional Gait Assessment.   <22/30 = predictive of falls, <20/30 = fall in 6 months, <18/30 = predictive of falls in PD MCID: 5 points stroke population, 4 points geriatric population (ANPTA Core Set of Outcome Measures for Adults with Neurologic Conditions, 2018)    PATIENT EDUCATION:  Education details: Pt educated throughout  session about proper posture and technique with exercises. Improved exercise technique, movement at target joints, use of target muscles after min to mod verbal, visual, tactile cues.  Person  educated: Patient Education method: Explanation, Demonstration, Tactile cues, and Verbal cues Education comprehension: verbalized understanding, returned demonstration, verbal cues required, tactile cues required, and needs further education  HOME EXERCISE PROGRAM: Access Code: [email or web address] URL: [email or web address] Date: 10/16/2022 Prepared by: Precious Bard Exercises - Standing Hip Flexor Stretch  - 1 x daily - 7 x weekly - 2 sets - 2 reps - 60 hold   Reviewed from last episode of care- will add to HEP as appropriate.   ASSESSMENT:  CLINICAL IMPRESSION:    PT treatment focused on goal assessment and revision for re-certification. Pt has met 1/2 short term goals and 6/8 LTG. Noted improvement in pain, slight improvement in strength and improved balance as evidenced on FGA. Pt demonstrates mild reduction in gait speed and distance on Walk test compared to aged matched norms, as well as continued difficulty with access of car and safety with unassisted gait limiting function and QoL. Goals revised to reflect current functional status.  Patient's condition has the potential to improve in response to therapy. Maximum improvement is yet to be obtained. The anticipated improvement is attainable and reasonable in a generally predictable time.   Pt will continue to benefit from skilled therapy to address remaining deficits in order to improve overall QoL and return to PLOF.      OBJECTIVE IMPAIRMENTS: Abnormal gait, decreased activity tolerance, decreased balance, decreased endurance, decreased mobility, difficulty walking, decreased ROM, decreased strength, and pain.   ACTIVITY LIMITATIONS: carrying, lifting, bending, sitting, standing, squatting, stairs, transfers, and bed mobility  PARTICIPATION LIMITATIONS: cleaning, laundry, shopping, community activity, and yard work  PERSONAL FACTORS: Time since onset of injury/illness/exacerbation and 1-2 comorbidities: HTN, Arthritis, past  cervical ACDF procedure in 2021  are also affecting patient's functional outcome.   REHAB POTENTIAL: Good  CLINICAL DECISION MAKING: Stable/uncomplicated  EVALUATION COMPLEXITY: Moderate   GOALS: Goals reviewed with patient? Yes  SHORT TERM GOALS: Target date: 01/28/2023    Pt. Independent with HEP to increase B LE strength 1/2 muscle grade to improve standing/ independence with gait.  Baseline: See chart in objective,  Goal status: IN PROGRESS  2.  Pt will report decreased use of cane for home ambulation  Baseline: intermittent use in home and consistent use outside of the house. Goal status: initial   3.  Pt will decrease worst back pain as reported on NPRS by at least 3 points in order to demonstrate clinically significant reduction in back pain.  Baseline: EVAL= Up to 8/10 low back pain 08/14/22: 4/10 worst after work 6/1: 4/10; 10/07/2022- up to 5/10 after working 3 half days in a row.  8/12: pt reports back back 3/10 at worst after working for the last week;   9/23: reports significantly reduced back pain 2/10 after work.   Goal status: MET  LONG TERM GOALS: Target date: 02/25/2023    Pt will improve FOTO to target score  of  60 or greater to display perceived improvements in ability to complete ADL's.  Baseline: EVAL= 47 5/8: 56 6/17: 56%; 10/07/2022= Did not administer today as patient was just assessed 2 weeks ago- 8/12:59  9/23:57 Goal status: IN PROGRESS  2.  Pt will decrease MODI score by at least 8 points in order demonstrate clinically significant reduction in back pain/disability.  Baseline: EVAL= 18 5/8: 10 6/17: 9/50  Goal status: MET  3.  Pt will decrease worst back pain as reported on NPRS by at least 3 points in order to demonstrate clinically significant reduction in back pain.  Baseline: EVAL= Up to 8/10 low back pain 08/14/22: 4/10 worst after work 6/1: 4/10; 10/07/2022- up to 5/10 after working 3 half days in a row.  8/12: pt reports back back 3/10 at worst  after working for the last week;   9/23: reports significantly reduced back pain 2/10 after work.   Goal status: MET  4.  Pt will improve FGA by at least 3 points in order to demonstrate clinically significant improvement in balance.   Baseline: EVAL: to be initiated next visit; 07/17/2022= 18/30 08/14/22:19/30 6/17:  20/30  9/23: 24/30  Goal status: MET   5.  Pt will decrease 5TSTS by at least 3 seconds in order to demonstrate clinically significant improvement in LE strength Baseline: EVAL= 19.23 sec  08/14/22:14.38 sec 8/12: 12.59; Goal status: MET  6.  Pt will increase by at least 0.13 m/s in order to demonstrate clinically significant improvement in community ambulation.   Baseline: EVAL= 0.65 m/s without UE support; 10/07/2022= 0.71 m/s; 8/12. 0.26m/s 9/23: normal 0.89 m/s fast 1.61m/s  Goal status:MET  7. Patient will improved 6 min walk test > 1250 feet  consistently for improved gait efficiency and improved overall gait endurance with community activities.  Baseline: 07/17/2022= 1050 feet with cane 5/8: 1230 ft no AD; 10/07/2022= 950 feet 8/12: 1165ft;  9/23:1215ft no AD  Goal status: MET/ Revised  8. Patient will report/demonstrate independence with all car transfers/tub transfers and floor transfers to maximize his functional independence in home/community.  Baseline: 10/07/2022= Patient reports difficulty due to pain/weakness at times with transfers 8/12: pt reports being able to stand from floor Modified independent; as long as he has UE support on something sturdy.  Goal status: in progress.  9/30: reports that transfers improving, but requires UE support to get up from floor. Getting in car without UE, but UE support to exit car.   Goal status: In progress  PLAN:  PT FREQUENCY: 1-2x/week  PT DURATION: 8 weeks  PLANNED INTERVENTIONS: Therapeutic exercises, Therapeutic activity, Neuromuscular re-education, Balance training, Gait training, Patient/Family education, Self  Care, Joint mobilization, Joint manipulation, Stair training, Vestibular training, Canalith repositioning, Orthotic/Fit training, DME instructions, Dry Needling, Spinal manipulation, Spinal mobilization, Cryotherapy, Moist heat, Taping, and Manual therapy.   Plan for next session  BLE strengthening. Core stability.   Grier Rocher PT, DPT  Physical Therapist - [place]  [facility]  10:16 AM 12/31/22

## 2023-01-01 ENCOUNTER — Ambulatory Visit: Payer: Medicare Other | Admitting: Physical Therapy

## 2023-01-01 DIAGNOSIS — R278 Other lack of coordination: Secondary | ICD-10-CM | POA: Diagnosis not present

## 2023-01-01 DIAGNOSIS — R2681 Unsteadiness on feet: Secondary | ICD-10-CM | POA: Diagnosis not present

## 2023-01-01 DIAGNOSIS — R269 Unspecified abnormalities of gait and mobility: Secondary | ICD-10-CM

## 2023-01-01 DIAGNOSIS — G8929 Other chronic pain: Secondary | ICD-10-CM | POA: Diagnosis not present

## 2023-01-01 DIAGNOSIS — M2560 Stiffness of unspecified joint, not elsewhere classified: Secondary | ICD-10-CM

## 2023-01-01 DIAGNOSIS — M545 Low back pain, unspecified: Secondary | ICD-10-CM | POA: Diagnosis not present

## 2023-01-01 DIAGNOSIS — M6281 Muscle weakness (generalized): Secondary | ICD-10-CM

## 2023-01-01 DIAGNOSIS — R262 Difficulty in walking, not elsewhere classified: Secondary | ICD-10-CM

## 2023-01-01 NOTE — Therapy (Signed)
OUTPATIENT PHYSICAL THERAPY TREATMENT/  PHYSICAL THERAPY PROGRESS NOTE   Dates of reporting period  11/06/2022   to   01/01/2023      Patient Name: Larry Blair MRN: [id number] DOB:04-08-1956, 67 y.o., male Today's Date: 01/01/2023  END OF SESSION:  PT End of Session - 01/01/23 1319     Visit Number 40    Number of Visits 55    Date for PT Re-Evaluation 02/25/23    Authorization Type BCBS Medicare    Authorization Time Period 10/07/22-12/30/22    Progress Note Due on Visit 40    PT Start Time 1319    PT Stop Time 1400    PT Time Calculation (min) 41 min    Equipment Utilized During Treatment Gait belt    Activity Tolerance Patient tolerated treatment well;No increased pain    Behavior During Therapy WFL for tasks assessed/performed               Past Medical History:  Diagnosis Date   Allergy    Arthritis    bilateral shoulders/LEFT knee   Asthma    Eczema    HTN (hypertension)    Hyperlipidemia    Substance abuse (HCC) 1976   daily   Past Surgical History:  Procedure Laterality Date   ANTERIOR CERVICAL DECOMP/DISCECTOMY FUSION N/A 08/30/2019   Procedure: CERVICAL THREE-FOUR ANTERIOR CERVICAL DECOMPRESSION/FUSION;  Surgeon: Tia Alert, MD;  Location: [facility] OR;  Service: Neurosurgery;  Laterality: N/A;   COLONOSCOPY  09/2017   HD-MAC-Plenvu (exc)-hems   KNEE ARTHROSCOPY Left    WISDOM TOOTH EXTRACTION     Patient Active Problem List   Diagnosis Date Noted   Encounter for general adult medical examination with abnormal findings 07/04/2022   COPD with asthma 08/15/2021   Chronic bilateral low back pain without sciatica 08/15/2021   Need for prophylactic vaccination and inoculation against varicella 08/15/2021   Benign prostatic hyperplasia without lower urinary tract symptoms 10/03/2020   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   Diuretic-induced hypokalemia 11/24/2017   Chronic eczema of hand 08/11/2017   Hyperlipidemia LDL goal <70 07/01/2017   Essential  hypertension 06/30/2017   Vitamin D deficiency 06/30/2017    PCP: Dr. Sanda Linger  REFERRING PROVIDER: Dr. Sanda Linger  REFERRING DIAG: Chronic bilateral Low back pain without Sciatica  Rationale for Evaluation and Treatment: Rehabilitation  THERAPY DIAG:  Muscle weakness (generalized)  Difficulty in walking, not elsewhere classified  Other lack of coordination  Abnormality of gait and mobility  Difficulty in walking  Unsteadiness on feet  Joint stiffness  Chronic bilateral low back pain without sciatica  ONSET DATE: 08/27/2022  SUBJECTIVE:  SUBJECTIVE STATEMENT:   Pt reports that he is doing okay, but no pain . Reports that SO, also receiving PT treatment,but missed scheduled therapy session today, is at home note feeling well. States that her arm is hurting and she is managing stress, and he is worrier about her, so he hasn't been as mobile the last few days resulting in increased stiffness in BLE     PERTINENT HISTORY:  Fall with neck surgery - started it all - damaged vertebrae in my neck- 2021- s/p ACDF procedure, remote knee scope.   PAIN:  Are you having pain? No  PRECAUTIONS: Fall  WEIGHT BEARING RESTRICTIONS: No  FALLS:  Has patient fallen in last 6 months? No  PATIENT GOALS: Patient reports he wants to be able to walk again without a cane if possible.   NEXT MD VISIT: No follow up  OBJECTIVE:   LOWER EXTREMITY ROM:      All B LE ROM= WNL     LOWER EXTREMITY MMT:     MMT Right eval Left eval R 5/8 L5/8 R 9/23 L 9/23  Hip flexion 3+ 3+ 3+ 4- 4- 4-  Hip extension 3+ 3+        Hip abduction 3+ 3+        Hip adduction 4 4 4 4  4+ 4+  Hip internal rotation 3+ 3+ 3+ 3+    Hip external rotation 3+ 3+ 4- 4-    Knee flexion 4 4 4 4 4 4   Knee extension 4 4 4+  4+ 4+ 4+  Ankle dorsiflexion 4 4 4+ 4+ 4+ 4+  Ankle plantarflexion 4 4 4 4 4 4   Ankle inversion 4 4 4 4     Ankle eversion 4 4 4 4      (Blank rows = not tested)  TODAY'S TREATMENT:  01/01/23  Nustep level 4 x 3.5 min +1 min level 1 cool down cues for full ROm and SPM>60   LTR x 10 with 3 sec hold bil  Bridge with core bracing x 10  Side plank through knees and elbow x 5 with 3 sec hold and x 2 with 6 sec hold bil .  prone hip extension x 8 bil 2 sec hold  Hip flexor stretch in prone x 30 sec bil  Qped UE raise x 5 bil with 3 sec hold Qped donkey kick x 8 with 2 sec hold  Tactile cues for core bracing and improved hold at end rage as tolerated.   Obstacle course navigation  Up/down 6inch step, across R mat, side step through airex beam, step across 3 airex pads. Performed x 2 laps with CGA-min assist from PT with min cues for posture to prevent anterior bias and reduce fall risk.   PATIENT EDUCATION:  Education details: Pt educated throughout session about proper posture and technique with exercises. Improved exercise technique, movement at target joints, use of target muscles after min to mod verbal, visual, tactile cues.  Person educated: Patient Education method: Explanation, Demonstration, Tactile cues, and Verbal cues Education comprehension: verbalized understanding, returned demonstration, verbal cues required, tactile cues required, and needs further education  HOME EXERCISE PROGRAM: Access Code: [email or web address] URL: [email or web address] Date: 10/16/2022 Prepared by: Precious Bard Exercises - Standing Hip Flexor Stretch  - 1 x daily - 7 x weekly - 2 sets - 2 reps - 60 hold   Reviewed from last episode of care- will add to HEP as appropriate.   ASSESSMENT:  CLINICAL IMPRESSION:    Goals  assessed last session for PT re-certification. See Goals for recent progress update.  PT treatment focused on core stability and functional gait training. Min cues with multimodal  instruction to improve deep core activation with noted improvement in TrA and obliques with tactile cues. Cues for improved awareness of anterior LOB and improved heel contact to manage dynamic gait training over unlevel obstacles.  Patient's condition has the potential to improve in response to therapy. Maximum improvement is yet to be obtained. The anticipated improvement is attainable and reasonable in a generally predictable time.   Pt will continue to benefit from skilled therapy to address remaining deficits in order to improve overall QoL and return to PLOF.      OBJECTIVE IMPAIRMENTS: Abnormal gait, decreased activity tolerance, decreased balance, decreased endurance, decreased mobility, difficulty walking, decreased ROM, decreased strength, and pain.   ACTIVITY LIMITATIONS: carrying, lifting, bending, sitting, standing, squatting, stairs, transfers, and bed mobility  PARTICIPATION LIMITATIONS: cleaning, laundry, shopping, community activity, and yard work  PERSONAL FACTORS: Time since onset of injury/illness/exacerbation and 1-2 comorbidities: HTN, Arthritis, past cervical ACDF procedure in 2021  are also affecting patient's functional outcome.   REHAB POTENTIAL: Good  CLINICAL DECISION MAKING: Stable/uncomplicated  EVALUATION COMPLEXITY: Moderate   GOALS: Goals reviewed with patient? Yes  SHORT TERM GOALS: Target date: 01/28/2023    Pt. Independent with HEP to increase B LE strength 1/2 muscle grade to improve standing/ independence with gait.  Baseline: See chart in objective,  Goal status: IN PROGRESS  2.  Pt will report decreased use of cane for home ambulation  Baseline: intermittent use in home and consistent use outside of the house. Goal status: initial   3.  Pt will decrease worst back pain as reported on NPRS by at least 3 points in order to demonstrate clinically significant reduction in back pain.  Baseline: EVAL= Up to 8/10 low back pain 08/14/22: 4/10 worst  after work 6/1: 4/10; 10/07/2022- up to 5/10 after working 3 half days in a row.  8/12: pt reports back back 3/10 at worst after working for the last week;   9/23: reports significantly reduced back pain 2/10 after work.   Goal status: MET  LONG TERM GOALS: Target date: 02/25/2023    Pt will improve FOTO to target score  of  60 or greater to display perceived improvements in ability to complete ADL's.  Baseline: EVAL= 47 5/8: 56 6/17: 56%; 10/07/2022= Did not administer today as patient was just assessed 2 weeks ago- 8/12:59  9/23:57 Goal status: IN PROGRESS  2.  Pt will decrease MODI score by at least 8 points in order demonstrate clinically significant reduction in back pain/disability.  Baseline: EVAL= 18 5/8: 10 6/17: 9/50  Goal status: MET  3.  Pt will decrease worst back pain as reported on NPRS by at least 3 points in order to demonstrate clinically significant reduction in back pain.  Baseline: EVAL= Up to 8/10 low back pain 08/14/22: 4/10 worst after work 6/1: 4/10; 10/07/2022- up to 5/10 after working 3 half days in a row.  8/12: pt reports back back 3/10 at worst after working for the last week;   9/23: reports significantly reduced back pain 2/10 after work.   Goal status: MET  4.  Pt will improve FGA by at least 3 points in order to demonstrate clinically significant improvement in balance.   Baseline: EVAL: to be initiated next visit; 07/17/2022= 18/30 08/14/22:19/30 6/17:  20/30  9/23: 24/30  Goal status: MET  5.  Pt will decrease 5TSTS by at least 3 seconds in order to demonstrate clinically significant improvement in LE strength Baseline: EVAL= 19.23 sec  08/14/22:14.38 sec 8/12: 12.59; Goal status: MET  6.  Pt will increase by at least 0.13 m/s in order to demonstrate clinically significant improvement in community ambulation.   Baseline: EVAL= 0.65 m/s without UE support; 10/07/2022= 0.71 m/s; 8/12. 0.31m/s 9/23: normal 0.89 m/s fast 1.27m/s  Goal status:MET  7.  Patient will improved 6 min walk test > 1250 feet  consistently for improved gait efficiency and improved overall gait endurance with community activities.  Baseline: 07/17/2022= 1050 feet with cane 5/8: 1230 ft no AD; 10/07/2022= 950 feet 8/12: 1169ft;  9/23:1248ft no AD  Goal status: MET/ Revised  8. Patient will report/demonstrate independence with all car transfers/tub transfers and floor transfers to maximize his functional independence in home/community.  Baseline: 10/07/2022= Patient reports difficulty due to pain/weakness at times with transfers 8/12: pt reports being able to stand from floor Modified independent; as long as he has UE support on something sturdy.  Goal status: in progress.  9/30: reports that transfers improving, but requires UE support to get up from floor. Getting in car without UE, but UE support to exit car.   Goal status: In progress  PLAN:  PT FREQUENCY: 1-2x/week  PT DURATION: 8 weeks  PLANNED INTERVENTIONS: Therapeutic exercises, Therapeutic activity, Neuromuscular re-education, Balance training, Gait training, Patient/Family education, Self Care, Joint mobilization, Joint manipulation, Stair training, Vestibular training, Canalith repositioning, Orthotic/Fit training, DME instructions, Dry Needling, Spinal manipulation, Spinal mobilization, Cryotherapy, Moist heat, Taping, and Manual therapy.   Plan for next session   BLE strengthening. Core stability. Functional transfers and hip strength  Grier Rocher PT, DPT  Physical Therapist - [facility]  [facility]  1:21 PM 01/01/23

## 2023-01-14 ENCOUNTER — Ambulatory Visit: Payer: Medicare Other | Admitting: Physical Therapy

## 2023-01-21 ENCOUNTER — Ambulatory Visit: Payer: Medicare Other | Admitting: Physical Therapy

## 2023-01-28 ENCOUNTER — Ambulatory Visit: Payer: Medicare Other | Admitting: Physical Therapy

## 2023-01-29 ENCOUNTER — Ambulatory Visit: Payer: Medicare Other | Admitting: Physical Therapy

## 2023-01-30 ENCOUNTER — Other Ambulatory Visit: Payer: Self-pay | Admitting: Internal Medicine

## 2023-01-30 DIAGNOSIS — L309 Dermatitis, unspecified: Secondary | ICD-10-CM

## 2023-02-04 ENCOUNTER — Ambulatory Visit: Payer: Medicare Other | Attending: Internal Medicine | Admitting: Physical Therapy

## 2023-02-04 DIAGNOSIS — M6281 Muscle weakness (generalized): Secondary | ICD-10-CM | POA: Insufficient documentation

## 2023-02-04 DIAGNOSIS — R2681 Unsteadiness on feet: Secondary | ICD-10-CM | POA: Insufficient documentation

## 2023-02-04 DIAGNOSIS — R262 Difficulty in walking, not elsewhere classified: Secondary | ICD-10-CM | POA: Insufficient documentation

## 2023-02-04 DIAGNOSIS — R269 Unspecified abnormalities of gait and mobility: Secondary | ICD-10-CM | POA: Insufficient documentation

## 2023-02-05 ENCOUNTER — Ambulatory Visit: Payer: Medicare Other | Admitting: Physical Therapy

## 2023-02-05 DIAGNOSIS — R262 Difficulty in walking, not elsewhere classified: Secondary | ICD-10-CM | POA: Diagnosis not present

## 2023-02-05 DIAGNOSIS — M6281 Muscle weakness (generalized): Secondary | ICD-10-CM

## 2023-02-05 DIAGNOSIS — R269 Unspecified abnormalities of gait and mobility: Secondary | ICD-10-CM | POA: Diagnosis not present

## 2023-02-05 DIAGNOSIS — R2681 Unsteadiness on feet: Secondary | ICD-10-CM | POA: Diagnosis not present

## 2023-02-05 NOTE — Therapy (Unsigned)
OUTPATIENT PHYSICAL THERAPY TREATMENT/  PHYSICAL THERAPY PROGRESS NOTE   Dates of reporting period  11/06/2022   to   02/05/2023      Patient Name: Larry Blair MRN: [id number] DOB:[birth year], 67 y.o., male Today's Date: 02/05/2023  END OF SESSION:  PT End of Session - 02/05/23 1611     Visit Number 41    Number of Visits 55    Date for PT Re-Evaluation 02/25/23    Authorization Type BCBS Medicare    Authorization Time Period 10/07/22-12/30/22    Progress Note Due on Visit 40    Equipment Utilized During Treatment Gait belt    Activity Tolerance Patient tolerated treatment well;No increased pain    Behavior During Therapy WFL for tasks assessed/performed               Past Medical History:  Diagnosis Date   Allergy    Arthritis    bilateral shoulders/LEFT knee   Asthma    Eczema    HTN (hypertension)    Hyperlipidemia    Substance abuse (HCC) 1976   daily   Past Surgical History:  Procedure Laterality Date   ANTERIOR CERVICAL DECOMP/DISCECTOMY FUSION N/A 08/30/2019   Procedure: CERVICAL THREE-FOUR ANTERIOR CERVICAL DECOMPRESSION/FUSION;  Surgeon: Tia Alert, MD;  Location: [facility] OR;  Service: Neurosurgery;  Laterality: N/A;   COLONOSCOPY  09/2017   HD-MAC-Plenvu (exc)-hems   KNEE ARTHROSCOPY Left    WISDOM TOOTH EXTRACTION     Patient Active Problem List   Diagnosis Date Noted   Encounter for general adult medical examination with abnormal findings 07/04/2022   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  Sanda Linger  REFERRING PROVIDER: Dr. Sanda Linger  REFERRING DIAG: Chronic bilateral Low back pain without Sciatica  Rationale for Evaluation and Treatment: Rehabilitation  THERAPY DIAG:  Muscle weakness (generalized)  Difficulty in walking, not elsewhere classified  Abnormality of gait and mobility  Difficulty in walking  Unsteadiness on feet  ONSET DATE: 08/27/2022  SUBJECTIVE:  SUBJECTIVE STATEMENT:   Pt reports that he is doing well, was able to meet his DTR and visit his son in [place] as well as go to a [employer] game.   PERTINENT HISTORY:  Fall with neck surgery - started it all - damaged vertebrae in my neck- 2021- s/p ACDF procedure, remote knee scope.   PAIN:  Are you having pain? No  PRECAUTIONS: Fall  WEIGHT BEARING RESTRICTIONS: No  FALLS:  Has patient fallen in last 6 months? No  PATIENT GOALS: Patient reports he wants to be able to walk again without a cane if possible.   NEXT MD VISIT: No follow up  OBJECTIVE:   LOWER EXTREMITY ROM:      All B LE ROM= WNL     LOWER EXTREMITY MMT:     MMT Right eval Left eval R 5/8 L5/8 R 9/23 L 9/23  Hip flexion 3+ 3+ 3+ 4- 4- 4-  Hip extension 3+ 3+        Hip abduction 3+ 3+        Hip adduction 4 4 4 4  4+ 4+  Hip internal rotation 3+ 3+ 3+ 3+    Hip external rotation 3+ 3+ 4- 4-    Knee flexion 4 4 4 4 4 4   Knee extension 4 4 4+ 4+ 4+ 4+  Ankle dorsiflexion 4 4 4+ 4+ 4+ 4+  Ankle plantarflexion 4 4 4 4 4 4   Ankle inversion 4 4 4 4     Ankle eversion 4 4 4 4      (Blank rows = not tested)  TODAY'S TREATMENT:  02/05/23  Nustep level 4 x 6 min, B UE and LE reciprocal movement   Bridge with core bracing and GTB around knees for increased gluteal activation 2  x 10  Side plank through knees and elbow  2 x 5 with  5 sec holds ea side  Hip flexor stretch on knees with knee on pad 2 x 45 sec bil  Qped UE raise 2 x 5 bil with 3 sec hold Qped hip extension 2 x 5 bilaterally  Bird dog x 5 ea  Tactile cues for core bracing and improved hold at end rage as tolerated.  Floor to stand transfer x 5 on ea side, lunge style, cues for weight shift to improve foot clearance capability. Good result and pt reports this strategy as helpful.  LTR 10 x ea side with 3 sec holds.    PATIENT EDUCATION:  Education details: Pt educated throughout session about proper posture and technique with exercises. Improved exercise technique, movement at target joints, use of target muscles after min to mod verbal, visual, tactile cues.  Person educated: Patient Education method: Explanation, Demonstration, Tactile cues, and Verbal cues Education comprehension: verbalized understanding, returned demonstration, verbal cues required, tactile cues required, and needs further education  HOME EXERCISE PROGRAM: Access Code: [email or web address] URL: [email or web address] Date: 10/16/2022 Prepared by: Precious Bard Exercises - Standing Hip Flexor Stretch  - 1 x daily - 7 x weekly - 2 sets - 2 reps - 60 hold   Reviewed from last episode of care- will add to HEP as appropriate.   ASSESSMENT:  CLINICAL IMPRESSION:      PT treatment focused on core stability and functional gait training.    OBJECTIVE IMPAIRMENTS: Abnormal gait, decreased activity tolerance, decreased balance, decreased endurance, decreased mobility, difficulty walking, decreased ROM, decreased strength, and pain.   ACTIVITY LIMITATIONS: carrying, lifting, bending, sitting, standing, squatting, stairs, transfers, and bed  mobility  PARTICIPATION LIMITATIONS: cleaning, laundry, shopping, community activity, and yard work  PERSONAL FACTORS: Time since onset of injury/illness/exacerbation and 1-2 comorbidities: HTN, Arthritis, past cervical ACDF procedure in 2021  are also  affecting patient's functional outcome.   REHAB POTENTIAL: Good  CLINICAL DECISION MAKING: Stable/uncomplicated  EVALUATION COMPLEXITY: Moderate   GOALS: Goals reviewed with patient? Yes  SHORT TERM GOALS: Target date: 01/28/2023    Pt. Independent with HEP to increase B LE strength 1/2 muscle grade to improve standing/ independence with gait.  Baseline: See chart in objective,  Goal status: IN PROGRESS  2.  Pt will report decreased use of cane for home ambulation  Baseline: intermittent use in home and consistent use outside of the house. Goal status: initial   3.  Pt will decrease worst back pain as reported on NPRS by at least 3 points in order to demonstrate clinically significant reduction in back pain.  Baseline: EVAL= Up to 8/10 low back pain 08/14/22: 4/10 worst after work 6/1: 4/10; 10/07/2022- up to 5/10 after working 3 half days in a row.  8/12: pt reports back back 3/10 at worst after working for the last week;   9/23: reports significantly reduced back pain 2/10 after work.   Goal status: MET  LONG TERM GOALS: Target date: 02/25/2023    Pt will improve FOTO to target score  of  60 or greater to display perceived improvements in ability to complete ADL's.  Baseline: EVAL= 47 5/8: 56 6/17: 56%; 10/07/2022= Did not administer today as patient was just assessed 2 weeks ago- 8/12:59  9/23:57 Goal status: IN PROGRESS  2.  Pt will decrease MODI score by at least 8 points in order demonstrate clinically significant reduction in back pain/disability.  Baseline: EVAL= 18 5/8: 10 6/17: 9/50  Goal status: MET  3.  Pt will decrease worst back pain as reported on NPRS by at least 3 points in order to demonstrate clinically significant reduction in back pain.  Baseline: EVAL= Up to 8/10 low back pain 08/14/22: 4/10 worst after work 6/1: 4/10; 10/07/2022- up to 5/10 after working 3 half days in a row.  8/12: pt reports back back 3/10 at worst after working for the last week;   9/23:  reports significantly reduced back pain 2/10 after work.   Goal status: MET  4.  Pt will improve FGA by at least 3 points in order to demonstrate clinically significant improvement in balance.   Baseline: EVAL: to be initiated next visit; 07/17/2022= 18/30 08/14/22:19/30 6/17:  20/30  9/23: 24/30  Goal status: MET   5.  Pt will decrease 5TSTS by at least 3 seconds in order to demonstrate clinically significant improvement in LE strength Baseline: EVAL= 19.23 sec  08/14/22:14.38 sec 8/12: 12.59; Goal status: MET  6.  Pt will increase by at least 0.13 m/s in order to demonstrate clinically significant improvement in community ambulation.   Baseline: EVAL= 0.65 m/s without UE support; 10/07/2022= 0.71 m/s; 8/12. 0.33m/s 9/23: normal 0.89 m/s fast 1.92m/s  Goal status:MET  7. Patient will improved 6 min walk test > 1250 feet  consistently for improved gait efficiency and improved overall gait endurance with community activities.  Baseline: 07/17/2022= 1050 feet with cane 5/8: 1230 ft no AD; 10/07/2022= 950 feet 8/12: 1156ft;  9/23:1289ft no AD  Goal status: MET/ Revised  8. Patient will report/demonstrate independence with all car transfers/tub transfers and floor transfers to maximize his functional independence in home/community.  Baseline: 10/07/2022= Patient reports  difficulty due to pain/weakness at times with transfers 8/12: pt reports being able to stand from floor Modified independent; as long as he has UE support on something sturdy.  Goal status: in progress.  9/30: reports that transfers improving, but requires UE support to get up from floor. Getting in car without UE, but UE support to exit car.   Goal status: In progress  PLAN:  PT FREQUENCY: 1-2x/week  PT DURATION: 8 weeks  PLANNED INTERVENTIONS: Therapeutic exercises, Therapeutic activity, Neuromuscular re-education, Balance training, Gait training, Patient/Family education, Self Care, Joint mobilization, Joint  manipulation, Stair training, Vestibular training, Canalith repositioning, Orthotic/Fit training, DME instructions, Dry Needling, Spinal manipulation, Spinal mobilization, Cryotherapy, Moist heat, Taping, and Manual therapy.   Plan for next session   BLE strengthening. Core stability. Functional transfers and hip strength  Norman Herrlich PT ,DPT Physical Therapist- [facility]  [facility]   4:12 PM 02/05/23

## 2023-02-08 ENCOUNTER — Other Ambulatory Visit: Payer: Self-pay | Admitting: Internal Medicine

## 2023-02-08 DIAGNOSIS — E785 Hyperlipidemia, unspecified: Secondary | ICD-10-CM

## 2023-02-08 DIAGNOSIS — I10 Essential (primary) hypertension: Secondary | ICD-10-CM

## 2023-02-11 ENCOUNTER — Ambulatory Visit: Payer: Medicare Other | Attending: Internal Medicine | Admitting: Physical Therapy

## 2023-02-11 DIAGNOSIS — R2681 Unsteadiness on feet: Secondary | ICD-10-CM | POA: Diagnosis not present

## 2023-02-11 DIAGNOSIS — R278 Other lack of coordination: Secondary | ICD-10-CM | POA: Diagnosis not present

## 2023-02-11 DIAGNOSIS — G8929 Other chronic pain: Secondary | ICD-10-CM | POA: Insufficient documentation

## 2023-02-11 DIAGNOSIS — M545 Low back pain, unspecified: Secondary | ICD-10-CM | POA: Diagnosis not present

## 2023-02-11 DIAGNOSIS — R269 Unspecified abnormalities of gait and mobility: Secondary | ICD-10-CM | POA: Diagnosis not present

## 2023-02-11 DIAGNOSIS — M6281 Muscle weakness (generalized): Secondary | ICD-10-CM

## 2023-02-11 DIAGNOSIS — R262 Difficulty in walking, not elsewhere classified: Secondary | ICD-10-CM

## 2023-02-11 DIAGNOSIS — M2560 Stiffness of unspecified joint, not elsewhere classified: Secondary | ICD-10-CM

## 2023-02-11 NOTE — Therapy (Signed)
OUTPATIENT PHYSICAL THERAPY TREATMENT      Patient Name: Larry Blair MRN: [id number] DOB:06-25-1955, 67 y.o., male Today's Date: 02/11/2023  END OF SESSION:  PT End of Session - 02/11/23 1454     Visit Number 42    Number of Visits 55    Date for PT Re-Evaluation 02/25/23    Authorization Type BCBS Medicare    Authorization Time Period 10/07/22-12/30/22    Progress Note Due on Visit 40    PT Start Time 1445    PT Stop Time 1525    PT Time Calculation (min) 40 min    Equipment Utilized During Treatment Gait belt    Activity Tolerance Patient tolerated treatment well;No increased pain    Behavior During Therapy WFL for tasks assessed/performed               Past Medical History:  Diagnosis Date   Allergy    Arthritis    bilateral shoulders/LEFT knee   Asthma    Eczema    HTN (hypertension)    Hyperlipidemia    Substance abuse (HCC) 1976   daily   Past Surgical History:  Procedure Laterality Date   ANTERIOR CERVICAL DECOMP/DISCECTOMY FUSION N/A 08/30/2019   Procedure: CERVICAL THREE-FOUR ANTERIOR CERVICAL DECOMPRESSION/FUSION;  Surgeon: Tia Alert, MD;  Location: [facility];  Service: Neurosurgery;  Laterality: N/A;   COLONOSCOPY  09/2017   HD-MAC-Plenvu (exc)-hems   KNEE ARTHROSCOPY Left    WISDOM TOOTH EXTRACTION     Patient Active Problem List   Diagnosis Date Noted   Encounter for general adult medical examination with abnormal findings 07/04/2022   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 Sanda Linger  REFERRING PROVIDER: Dr. Sanda Linger  REFERRING DIAG: Chronic bilateral Low back pain without Sciatica  Rationale for Evaluation and Treatment: Rehabilitation  THERAPY DIAG:  Muscle weakness (generalized)  Difficulty in walking, not elsewhere classified  Abnormality of gait and mobility  Difficulty in walking  Unsteadiness on feet  Other lack of coordination  Joint stiffness  Chronic bilateral low back pain without sciatica  ONSET DATE: 08/27/2022  SUBJECTIVE:  SUBJECTIVE STATEMENT:   Pt reports that he is doing well, states that he feels a little tight today. Is improving with floor transfers.   PERTINENT HISTORY:  Fall with neck surgery - started it all - damaged vertebrae in my neck- 2021- s/p ACDF procedure, remote knee scope.   PAIN:  Are you having pain? No  PRECAUTIONS: Fall  WEIGHT BEARING RESTRICTIONS: No  FALLS:  Has patient fallen in last 6 months? No  PATIENT GOALS: Patient reports he wants to be able to walk again without a cane if possible.   NEXT MD VISIT: No follow up  OBJECTIVE:   LOWER EXTREMITY ROM:      All B LE ROM= WNL     LOWER EXTREMITY MMT:     MMT Right eval Left eval R 5/8 L5/8 R 9/23 L 9/23  Hip flexion 3+ 3+ 3+ 4- 4- 4-  Hip extension 3+ 3+        Hip abduction 3+ 3+        Hip adduction 4 4 4 4  4+ 4+  Hip internal rotation 3+ 3+ 3+ 3+    Hip external rotation 3+ 3+ 4- 4-    Knee flexion 4 4 4 4 4 4   Knee extension 4 4 4+ 4+ 4+ 4+  Ankle dorsiflexion 4 4 4+ 4+ 4+ 4+  Ankle plantarflexion 4 4 4 4 4 4   Ankle inversion 4 4 4 4     Ankle eversion 4 4 4 4      (Blank rows = not tested)  TODAY'S TREATMENT:  02/11/23 TE Nustep level 4 x 6 min, B UE and LE reciprocal movement   Bridge with core bracing around knees for increased  gluteal activation 2  x 10  SLR with 3 sec hold 2x 10 bil  Side plank through knees and elbow  2 x 10 with 5 sec holds ea side  Hip flexor stretch in mod all 4s with hands on 24 in box and knees on mat table 2 x 45 sec bil  Qped cat/cat x 10  Qped UE raise 2 x 5 bil with 3 sec hold - bird dog progression Qped hip extension 2 x 5 bilaterally - bird dog progression Quad stretch x 30 sce bil  Prone HS curl AROM x 10 bil    PATIENT EDUCATION:  Education details: Pt educated throughout session about proper posture and technique with exercises. Improved exercise technique, movement at target joints, use of target muscles after min to mod verbal, visual, tactile cues.  Person educated: Patient Education method: Explanation, Demonstration, Tactile cues, and Verbal cues Education comprehension: verbalized understanding, returned demonstration, verbal cues required, tactile cues required, and needs further education  HOME EXERCISE PROGRAM: Access Code: [email or web address] URL: [email or web address] Date: 10/16/2022 Prepared by: Precious Bard Exercises - Standing Hip Flexor Stretch  - 1 x daily - 7 x weekly - 2 sets - 2 reps - 60 hold   Reviewed from last episode of care- will add to HEP as appropriate.   ASSESSMENT:  CLINICAL IMPRESSION:      PT treatment focused on core stability and functional gait training.  Patient shows good progress deep care activation with cat/cow and ability to hold neutral spine with hip extension and UE reach. Patient reports no increase in pain at this time and is also reported improved functional mobility when needing to get on and off floor at work. Pt will continue to benefit from skilled physical therapy intervention to address impairments, improve QOL,  and attain therapy goals.     OBJECTIVE IMPAIRMENTS: Abnormal gait, decreased activity tolerance, decreased balance, decreased endurance, decreased mobility, difficulty walking, decreased ROM, decreased  strength, and pain.   ACTIVITY LIMITATIONS: carrying, lifting, bending, sitting, standing, squatting, stairs, transfers, and bed mobility  PARTICIPATION LIMITATIONS: cleaning, laundry, shopping, community activity, and yard work  PERSONAL FACTORS: Time since onset of injury/illness/exacerbation and 1-2 comorbidities: HTN, Arthritis, past cervical ACDF procedure in 2021  are also affecting patient's functional outcome.   REHAB POTENTIAL: Good  CLINICAL DECISION MAKING: Stable/uncomplicated  EVALUATION COMPLEXITY: Moderate   GOALS: Goals reviewed with patient? Yes  SHORT TERM GOALS: Target date: 01/28/2023    Pt. Independent with HEP to increase B LE strength 1/2 muscle grade to improve standing/ independence with gait.  Baseline: See chart in objective,  Goal status: IN PROGRESS  2.  Pt will report decreased use of cane for home ambulation  Baseline: intermittent use in home and consistent use outside of the house. Goal status: initial   3.  Pt will decrease worst back pain as reported on NPRS by at least 3 points in order to demonstrate clinically significant reduction in back pain.  Baseline: EVAL= Up to 8/10 low back pain 08/14/22: 4/10 worst after work 6/1: 4/10; 10/07/2022- up to 5/10 after working 3 half days in a row.  8/12: pt reports back back 3/10 at worst after working for the last week;   9/23: reports significantly reduced back pain 2/10 after work.   Goal status: MET  LONG TERM GOALS: Target date: 02/25/2023    Pt will improve FOTO to target score  of  60 or greater to display perceived improvements in ability to complete ADL's.  Baseline: EVAL= 47 5/8: 56 6/17: 56%; 10/07/2022= Did not administer today as patient was just assessed 2 weeks ago- 8/12:59  9/23:57 Goal status: IN PROGRESS  2.  Pt will decrease MODI score by at least 8 points in order demonstrate clinically significant reduction in back pain/disability.  Baseline: EVAL= 18 5/8: 10 6/17: 9/50  Goal  status: MET  3.  Pt will decrease worst back pain as reported on NPRS by at least 3 points in order to demonstrate clinically significant reduction in back pain.  Baseline: EVAL= Up to 8/10 low back pain 08/14/22: 4/10 worst after work 6/1: 4/10; 10/07/2022- up to 5/10 after working 3 half days in a row.  8/12: pt reports back back 3/10 at worst after working for the last week;   9/23: reports significantly reduced back pain 2/10 after work.   Goal status: MET  4.  Pt will improve FGA by at least 3 points in order to demonstrate clinically significant improvement in balance.   Baseline: EVAL: to be initiated next visit; 07/17/2022= 18/30 08/14/22:19/30 6/17:  20/30  9/23: 24/30  Goal status: MET   5.  Pt will decrease 5TSTS by at least 3 seconds in order to demonstrate clinically significant improvement in LE strength Baseline: EVAL= 19.23 sec  08/14/22:14.38 sec 8/12: 12.59; Goal status: MET  6.  Pt will increase by at least 0.13 m/s in order to demonstrate clinically significant improvement in community ambulation.   Baseline: EVAL= 0.65 m/s without UE support; 10/07/2022= 0.71 m/s; 8/12. 0.60m/s 9/23: normal 0.89 m/s fast 1.90m/s  Goal status:MET  7. Patient will improved 6 min walk test > 1250 feet  consistently for improved gait efficiency and improved overall gait endurance with community activities.  Baseline: 07/17/2022= 1050 feet with cane 5/8: 1230  ft no AD; 10/07/2022= 950 feet 8/12: 111ft;  9/23:1234ft no AD  Goal status: MET/ Revised  8. Patient will report/demonstrate independence with all car transfers/tub transfers and floor transfers to maximize his functional independence in home/community.  Baseline: 10/07/2022= Patient reports difficulty due to pain/weakness at times with transfers 8/12: pt reports being able to stand from floor Modified independent; as long as he has UE support on something sturdy.  Goal status: in progress.  9/30: reports that transfers improving, but  requires UE support to get up from floor. Getting in car without UE, but UE support to exit car.   Goal status: In progress  PLAN:  PT FREQUENCY: 1-2x/week  PT DURATION: 8 weeks  PLANNED INTERVENTIONS: Therapeutic exercises, Therapeutic activity, Neuromuscular re-education, Balance training, Gait training, Patient/Family education, Self Care, Joint mobilization, Joint manipulation, Stair training, Vestibular training, Canalith repositioning, Orthotic/Fit training, DME instructions, Dry Needling, Spinal manipulation, Spinal mobilization, Cryotherapy, Moist heat, Taping, and Manual therapy.   Plan for next session   BLE strengthening. Core stability. Functional transfers and hip strength  Golden Pop PT ,DPT Physical Therapist- [facility]  [facility]   2:57 PM 02/11/23

## 2023-02-18 ENCOUNTER — Ambulatory Visit: Payer: Medicare Other | Admitting: Physical Therapy

## 2023-02-18 DIAGNOSIS — R262 Difficulty in walking, not elsewhere classified: Secondary | ICD-10-CM

## 2023-02-18 DIAGNOSIS — M2560 Stiffness of unspecified joint, not elsewhere classified: Secondary | ICD-10-CM | POA: Diagnosis not present

## 2023-02-18 DIAGNOSIS — M545 Low back pain, unspecified: Secondary | ICD-10-CM | POA: Diagnosis not present

## 2023-02-18 DIAGNOSIS — R2681 Unsteadiness on feet: Secondary | ICD-10-CM | POA: Diagnosis not present

## 2023-02-18 DIAGNOSIS — R278 Other lack of coordination: Secondary | ICD-10-CM

## 2023-02-18 DIAGNOSIS — M6281 Muscle weakness (generalized): Secondary | ICD-10-CM

## 2023-02-18 DIAGNOSIS — R269 Unspecified abnormalities of gait and mobility: Secondary | ICD-10-CM | POA: Diagnosis not present

## 2023-02-18 DIAGNOSIS — G8929 Other chronic pain: Secondary | ICD-10-CM | POA: Diagnosis not present

## 2023-02-18 NOTE — Therapy (Signed)
OUTPATIENT PHYSICAL THERAPY TREATMENT      Patient Name: Larry Blair MRN: [id number] DOB:10/16/1955, 67 y.o., male Today's Date: 02/18/2023  END OF SESSION:  PT End of Session - 02/18/23 1038     Visit Number 43    Number of Visits 55    Date for PT Re-Evaluation 02/25/23    Authorization Type BCBS Medicare    Authorization Time Period 10/07/22-12/30/22    Progress Note Due on Visit 40    PT Start Time 1022    PT Stop Time 1100    PT Time Calculation (min) 38 min    Equipment Utilized During Treatment Gait belt    Activity Tolerance Patient tolerated treatment well;No increased pain    Behavior During Therapy WFL for tasks assessed/performed               Past Medical History:  Diagnosis Date   Allergy    Arthritis    bilateral shoulders/LEFT knee   Asthma    Eczema    HTN (hypertension)    Hyperlipidemia    Substance abuse (HCC) 1976   daily   Past Surgical History:  Procedure Laterality Date   ANTERIOR CERVICAL DECOMP/DISCECTOMY FUSION N/A 08/30/2019   Procedure: CERVICAL THREE-FOUR ANTERIOR CERVICAL DECOMPRESSION/FUSION;  Surgeon: Tia Alert, MD;  Location: [facility] OR;  Service: Neurosurgery;  Laterality: N/A;   COLONOSCOPY  09/2017   HD-MAC-Plenvu (exc)-hems   KNEE ARTHROSCOPY Left    WISDOM TOOTH EXTRACTION     Patient Active Problem List   Diagnosis Date Noted   Encounter for general adult medical examination with abnormal findings 07/04/2022   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 Sanda Linger  REFERRING PROVIDER: Dr. Sanda Linger  REFERRING DIAG: Chronic bilateral Low back pain without Sciatica  Rationale for Evaluation and Treatment: Rehabilitation  THERAPY DIAG:  Muscle weakness (generalized)  Difficulty in walking, not elsewhere classified  Abnormality of gait and mobility  Difficulty in walking  Unsteadiness on feet  Other lack of coordination  Joint stiffness  ONSET DATE: 08/27/2022  SUBJECTIVE:  SUBJECTIVE STATEMENT:   Pt reports that he is doing well, states that he feels a little tight today. Is improving with floor transfers.   PERTINENT HISTORY:  Fall with neck surgery - started it all - damaged vertebrae in my neck- 2021- s/p ACDF procedure, remote knee scope.   PAIN:  Are you having pain? No  PRECAUTIONS: Fall  WEIGHT BEARING RESTRICTIONS: No  FALLS:  Has patient fallen in last 6 months? No  PATIENT GOALS: Patient reports he wants to be able to walk again without a cane if possible.   NEXT MD VISIT: No follow up  OBJECTIVE:   LOWER EXTREMITY ROM:      All B LE ROM= WNL     LOWER EXTREMITY MMT:     MMT Right eval Left eval R 5/8 L5/8 R 9/23 L 9/23  Hip flexion 3+ 3+ 3+ 4- 4- 4-  Hip extension 3+ 3+        Hip abduction 3+ 3+        Hip adduction 4 4 4 4  4+ 4+  Hip internal rotation 3+ 3+ 3+ 3+    Hip external rotation 3+ 3+ 4- 4-    Knee flexion 4 4 4 4 4 4   Knee extension 4 4 4+ 4+ 4+ 4+  Ankle dorsiflexion 4 4 4+ 4+ 4+ 4+  Ankle plantarflexion 4 4 4 4 4 4   Ankle inversion 4 4 4 4     Ankle eversion 4 4 4 4      (Blank rows = not tested)  TODAY'S TREATMENT:  02/18/23  Bridge with core bracing x 12 with 2 sec hold  SLR with 3 sec hold x 12  bil  Side plank through knees and elbow x 10 with 5 sec holds ea side  Leg raise hold at 6  inches 3 x 10 sec  Bicycle LE only 2x 8  Qped cat/cat x 10  Qped UE raise 2 x 5 bil with 3 sec hold - bird dog progression Qped hip extension 2 x 5 bilaterally - bird dog progression Prone Quad stretch 2x 30 sce bil  Knee plank 3 x 10 sec.   Sit<>stand with 15 # kettle bell x 20   Cues for full ROM and improved lumbar alignment to improve deep care activation.   PATIENT EDUCATION:  Education details: Pt educated throughout session about proper posture and technique with exercises. Improved exercise technique, movement at target joints, use of target muscles after min to mod verbal, visual, tactile cues.  Person educated: Patient Education method: Explanation, Demonstration, Tactile cues, and Verbal cues Education comprehension: verbalized understanding, returned demonstration, verbal cues required, tactile cues required, and needs further education  HOME EXERCISE PROGRAM: Access Code: [email or web address] URL: [email or web address] Date: 10/16/2022 Prepared by: Precious Bard Exercises - Standing Hip Flexor Stretch  - 1 x daily - 7 x weekly - 2 sets - 2 reps - 60 hold   Reviewed from last episode of care- will add to HEP as appropriate.   ASSESSMENT:  CLINICAL IMPRESSION:      PT treatment focused on core stability. Pt tolerated increased demand on deep core with increased tolerance to sustained hip flexion as well as improved activation of paraspinals keeping neutral spine position in qped with min cues. Pt will continue to benefit from skilled physical therapy intervention to address impairments, improve QOL, and attain therapy goals.     OBJECTIVE IMPAIRMENTS: Abnormal gait, decreased activity tolerance, decreased balance, decreased endurance, decreased mobility, difficulty walking, decreased  ROM, decreased strength, and pain.   ACTIVITY LIMITATIONS: carrying, lifting, bending, sitting, standing, squatting, stairs, transfers, and bed mobility  PARTICIPATION LIMITATIONS:  cleaning, laundry, shopping, community activity, and yard work  PERSONAL FACTORS: Time since onset of injury/illness/exacerbation and 1-2 comorbidities: HTN, Arthritis, past cervical ACDF procedure in 2021  are also affecting patient's functional outcome.   REHAB POTENTIAL: Good  CLINICAL DECISION MAKING: Stable/uncomplicated  EVALUATION COMPLEXITY: Moderate   GOALS: Goals reviewed with patient? Yes  SHORT TERM GOALS: Target date: 01/28/2023    Pt. Independent with HEP to increase B LE strength 1/2 muscle grade to improve standing/ independence with gait.  Baseline: See chart in objective,  Goal status: IN PROGRESS  2.  Pt will report decreased use of cane for home ambulation  Baseline: intermittent use in home and consistent use outside of the house. Goal status: initial   3.  Pt will decrease worst back pain as reported on NPRS by at least 3 points in order to demonstrate clinically significant reduction in back pain.  Baseline: EVAL= Up to 8/10 low back pain 08/14/22: 4/10 worst after work 6/1: 4/10; 10/07/2022- up to 5/10 after working 3 half days in a row.  8/12: pt reports back back 3/10 at worst after working for the last week;   9/23: reports significantly reduced back pain 2/10 after work.   Goal status: MET  LONG TERM GOALS: Target date: 02/25/2023    Pt will improve FOTO to target score  of  60 or greater to display perceived improvements in ability to complete ADL's.  Baseline: EVAL= 47 5/8: 56 6/17: 56%; 10/07/2022= Did not administer today as patient was just assessed 2 weeks ago- 8/12:59  9/23:57 Goal status: IN PROGRESS  2.  Pt will decrease MODI score by at least 8 points in order demonstrate clinically significant reduction in back pain/disability.  Baseline: EVAL= 18 5/8: 10 6/17: 9/50  Goal status: MET  3.  Pt will decrease worst back pain as reported on NPRS by at least 3 points in order to demonstrate clinically significant reduction in back pain.   Baseline: EVAL= Up to 8/10 low back pain 08/14/22: 4/10 worst after work 6/1: 4/10; 10/07/2022- up to 5/10 after working 3 half days in a row.  8/12: pt reports back back 3/10 at worst after working for the last week;   9/23: reports significantly reduced back pain 2/10 after work.   Goal status: MET  4.  Pt will improve FGA by at least 3 points in order to demonstrate clinically significant improvement in balance.   Baseline: EVAL: to be initiated next visit; 07/17/2022= 18/30 08/14/22:19/30 6/17:  20/30  9/23: 24/30  Goal status: MET   5.  Pt will decrease 5TSTS by at least 3 seconds in order to demonstrate clinically significant improvement in LE strength Baseline: EVAL= 19.23 sec  08/14/22:14.38 sec 8/12: 12.59; Goal status: MET  6.  Pt will increase by at least 0.13 m/s in order to demonstrate clinically significant improvement in community ambulation.   Baseline: EVAL= 0.65 m/s without UE support; 10/07/2022= 0.71 m/s; 8/12. 0.69m/s 9/23: normal 0.89 m/s fast 1.43m/s  Goal status:MET  7. Patient will improved 6 min walk test > 1250 feet  consistently for improved gait efficiency and improved overall gait endurance with community activities.  Baseline: 07/17/2022= 1050 feet with cane 5/8: 1230 ft no AD; 10/07/2022= 950 feet 8/12: 1122ft;  9/23:1237ft no AD  Goal status: MET/ Revised  8. Patient will report/demonstrate independence with  all car transfers/tub transfers and floor transfers to maximize his functional independence in home/community.  Baseline: 10/07/2022= Patient reports difficulty due to pain/weakness at times with transfers 8/12: pt reports being able to stand from floor Modified independent; as long as he has UE support on something sturdy.  Goal status: in progress.  9/30: reports that transfers improving, but requires UE support to get up from floor. Getting in car without UE, but UE support to exit car.   Goal status: In progress  PLAN:  PT FREQUENCY: 1-2x/week  PT  DURATION: 8 weeks  PLANNED INTERVENTIONS: Therapeutic exercises, Therapeutic activity, Neuromuscular re-education, Balance training, Gait training, Patient/Family education, Self Care, Joint mobilization, Joint manipulation, Stair training, Vestibular training, Canalith repositioning, Orthotic/Fit training, DME instructions, Dry Needling, Spinal manipulation, Spinal mobilization, Cryotherapy, Moist heat, Taping, and Manual therapy.   Plan for next session   Goal assessment.   Golden Pop PT ,DPT Physical Therapist- [facility]   10:40 AM 02/18/23

## 2023-02-25 ENCOUNTER — Ambulatory Visit: Payer: Medicare Other | Admitting: Physical Therapy

## 2023-03-12 ENCOUNTER — Ambulatory Visit: Payer: Medicare Other | Attending: Internal Medicine | Admitting: Physical Therapy

## 2023-03-12 DIAGNOSIS — R2681 Unsteadiness on feet: Secondary | ICD-10-CM | POA: Diagnosis not present

## 2023-03-12 DIAGNOSIS — R262 Difficulty in walking, not elsewhere classified: Secondary | ICD-10-CM | POA: Diagnosis not present

## 2023-03-12 DIAGNOSIS — R269 Unspecified abnormalities of gait and mobility: Secondary | ICD-10-CM | POA: Diagnosis not present

## 2023-03-12 DIAGNOSIS — M6281 Muscle weakness (generalized): Secondary | ICD-10-CM | POA: Diagnosis not present

## 2023-03-12 NOTE — Therapy (Signed)
OUTPATIENT PHYSICAL THERAPY TREATMENT/ DISCHARGE THERAPY / RECERT       Patient Name: Larry Blair MRN: [id number] DOB:1955/09/08, 67 y.o., male Today's Date: 03/12/2023  END OF SESSION:  PT End of Session - 03/12/23 1526     Visit Number 44    Number of Visits 55    Date for PT Re-Evaluation 02/25/23    Authorization Type BCBS Medicare    Authorization Time Period 10/07/22-12/30/22    Progress Note Due on Visit 40    PT Start Time 1530    PT Stop Time 1613    PT Time Calculation (min) 43 min    Equipment Utilized During Treatment Gait belt    Activity Tolerance Patient tolerated treatment well;No increased pain    Behavior During Therapy WFL for tasks assessed/performed                Past Medical History:  Diagnosis Date   Allergy    Arthritis    bilateral shoulders/LEFT knee   Asthma    Eczema    HTN (hypertension)    Hyperlipidemia    Substance abuse (HCC) 1976   daily   Past Surgical History:  Procedure Laterality Date   ANTERIOR CERVICAL DECOMP/DISCECTOMY FUSION N/A 08/30/2019   Procedure: CERVICAL THREE-FOUR ANTERIOR CERVICAL DECOMPRESSION/FUSION;  Surgeon: Tia Alert, MD;  Location: [facility] OR;  Service: Neurosurgery;  Laterality: N/A;   COLONOSCOPY  09/2017   HD-MAC-Plenvu (exc)-hems   KNEE ARTHROSCOPY Left    WISDOM TOOTH EXTRACTION     Patient Active Problem List   Diagnosis Date Noted   Encounter for general adult medical examination with abnormal findings 07/04/2022   COPD with asthma (HCC) 08/15/2021   Chronic bilateral low back pain without sciatica 08/15/2021   Need for prophylactic vaccination and inoculation against varicella 08/15/2021   Benign prostatic hyperplasia without lower urinary tract symptoms 10/03/2020   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   Diuretic-induced hypokalemia 11/24/2017   Chronic eczema of hand 08/11/2017   Hyperlipidemia LDL goal <70 07/01/2017   Essential hypertension 06/30/2017   Vitamin D deficiency 06/30/2017     PCP: Dr. Sanda Linger  REFERRING PROVIDER: Dr. Sanda Linger  REFERRING DIAG: Chronic bilateral Low back pain without Sciatica  Rationale for Evaluation and Treatment: Rehabilitation  THERAPY DIAG:  Muscle weakness (generalized)  Difficulty in walking, not elsewhere classified  Abnormality of gait and mobility  Difficulty in walking  Unsteadiness on feet  ONSET DATE: 08/27/2022  SUBJECTIVE:  SUBJECTIVE STATEMENT:   Pt reports that he is doing well, feels he is in a good place with his mobility and is ready for discharge from PT at this time.   PERTINENT HISTORY:  Fall with neck surgery - started it all - damaged vertebrae in my neck- 2021- s/p ACDF procedure, remote knee scope.   PAIN:  Are you having pain? No  PRECAUTIONS: Fall  WEIGHT BEARING RESTRICTIONS: No  FALLS:  Has patient fallen in last 6 months? No  PATIENT GOALS: Patient reports he wants to be able to walk again without a cane if possible.   NEXT MD VISIT: No follow up  OBJECTIVE:   LOWER EXTREMITY ROM:      All B LE ROM= WNL     LOWER EXTREMITY MMT:     MMT Right eval Left eval R 5/8 L5/8 R 9/23 L 9/23  Hip flexion 3+ 3+ 3+ 4- 4- 4-  Hip extension 3+ 3+        Hip abduction 3+ 3+        Hip adduction 4 4 4 4  4+ 4+  Hip internal rotation 3+ 3+ 3+ 3+    Hip external rotation 3+ 3+ 4- 4-    Knee flexion 4 4 4 4 4 4   Knee extension 4 4 4+ 4+ 4+ 4+  Ankle dorsiflexion 4 4 4+ 4+ 4+ 4+  Ankle plantarflexion 4 4 4 4 4 4   Ankle inversion 4 4 4 4     Ankle eversion 4 4 4 4      (Blank rows = not tested)  TODAY'S TREATMENT:  03/12/23  Bridge with core bracing 2x 12 with 2 sec hold  SLR with 3 sec hold x 12  bil  Side plank through knees and elbow 4x 10 with 5 sec holds ea side  Bird dog x 10 ea LE,  instruction to break up into UE/LE movements if having trouble with balance aspect on a given day  Mod qped knees on mat with transition to split squat position for practice with transitions   SLS practice with intermittent UE assist x 45 sec ea LE   Cues for full ROM and improved lumbar alignment to improve deep care activation.   PATIENT EDUCATION:  Education details: Pt educated throughout session about proper posture and technique with exercises. Improved exercise technique, movement at target joints, use of target muscles after min to mod verbal, visual, tactile cues.  Person educated: Patient Education method: Explanation, Demonstration, Tactile cues, and Verbal cues Education comprehension: verbalized understanding, returned demonstration, verbal cues required, tactile cues required, and needs further education  HOME EXERCISE PROGRAM: Access Code: [email or web address] URL: [email or web address] Date: 03/12/2023 Prepared by: Thresa Ross  Exercises - Side Plank on Knees  - 1 x daily - 7 x weekly - 4 reps - 10 sec hold - Bird Dog  - 1 x daily - 7 x weekly - 2 sets - 10 reps - Cat Cow  - 1 x daily - 7 x weekly - 1 sets - 10 reps - 5 sec  hold - Supine Lower Trunk Rotation  - 1 x daily - 7 x weekly - 10 reps - 5 sec  hold - Small Range Straight Leg Raise  - 1 x daily - 7 x weekly - 1 sets - 10 reps - 5 sec hold  ASSESSMENT:  CLINICAL IMPRESSION:    Patient arrived with good motivation form completion of pt activities.  Pt assessed for goals today and has  achieved all of his long term goals at this time. Pt provided and instructed in advanced HEP this date. Pt will be formally discharged from PT at this time with advanced Hep in place and is very pleased with his treatment course in PT. Pt to be recerted for this final visit at this time.    OBJECTIVE IMPAIRMENTS: Abnormal gait, decreased activity tolerance, decreased balance, decreased endurance, decreased mobility,  difficulty walking, decreased ROM, decreased strength, and pain.   ACTIVITY LIMITATIONS: carrying, lifting, bending, sitting, standing, squatting, stairs, transfers, and bed mobility  PARTICIPATION LIMITATIONS: cleaning, laundry, shopping, community activity, and yard work  PERSONAL FACTORS: Time since onset of injury/illness/exacerbation and 1-2 comorbidities: HTN, Arthritis, past cervical ACDF procedure in 2021  are also affecting patient's functional outcome.   REHAB POTENTIAL: Good  CLINICAL DECISION MAKING: Stable/uncomplicated  EVALUATION COMPLEXITY: Moderate   GOALS: Goals reviewed with patient? Yes  SHORT TERM GOALS: Target date: 01/28/2023    Pt. Independent with HEP to increase B LE strength 1/2 muscle grade to improve standing/ independence with gait.  Baseline: See chart in objective,  Goal status: IN PROGRESS  2.  Pt will report decreased use of cane for home ambulation  Baseline: intermittent use in home and consistent use outside of the house. Goal status: initial   3.  Pt will decrease worst back pain as reported on NPRS by at least 3 points in order to demonstrate clinically significant reduction in back pain.  Baseline: EVAL= Up to 8/10 low back pain 08/14/22: 4/10 worst after work 6/1: 4/10; 10/07/2022- up to 5/10 after working 3 half days in a row.  8/12: pt reports back back 3/10 at worst after working for the last week;   9/23: reports significantly reduced back pain 2/10 after work.   Goal status: MET  LONG TERM GOALS: Target date: 03/12/23    Pt will improve FOTO to target score  of  60 or greater to display perceived improvements in ability to complete ADL's.  Baseline: EVAL= 47 5/8: 56 6/17: 56%; 10/07/2022= Did not administer today as patient was just assessed 2 weeks ago- 8/12:59  9/23:57 12/4:65 Goal status: IN PROGRESS  2.  Pt will decrease MODI score by at least 8 points in order demonstrate clinically significant reduction in back  pain/disability.  Baseline: EVAL= 18 5/8: 10 6/17: 9/50  Goal status: MET  3.  Pt will decrease worst back pain as reported on NPRS by at least 3 points in order to demonstrate clinically significant reduction in back pain.  Baseline: EVAL= Up to 8/10 low back pain 08/14/22: 4/10 worst after work 6/1: 4/10; 10/07/2022- up to 5/10 after working 3 half days in a row.  8/12: pt reports back back 3/10 at worst after working for the last week;   9/23: reports significantly reduced back pain 2/10 after work.   Goal status: MET  4.  Pt will improve FGA by at least 3 points in order to demonstrate clinically significant improvement in balance.   Baseline: EVAL: to be initiated next visit; 07/17/2022= 18/30 08/14/22:19/30 6/17:  20/30  9/23: 24/30  Goal status: MET   5.  Pt will decrease 5TSTS by at least 3 seconds in order to demonstrate clinically significant improvement in LE strength Baseline: EVAL= 19.23 sec  08/14/22:14.38 sec 8/12: 12.59; Goal status: MET  6.  Pt will increase by at least 0.13 m/s in order to demonstrate clinically significant improvement in community ambulation.   Baseline: EVAL= 0.65 m/s without  UE support; 10/07/2022= 0.71 m/s; 8/12. 0.61m/s 9/23: normal 0.89 m/s fast 1.70m/s  Goal status:MET  7. Patient will improved 6 min walk test > 1250 feet  consistently for improved gait efficiency and improved overall gait endurance with community activities.  Baseline: 07/17/2022= 1050 feet with cane 5/8: 1230 ft no AD; 10/07/2022= 950 feet 8/12: 1181ft;  9/23:1260ft no AD  Goal status: MET/ Revised  8. Patient will report/demonstrate independence with all car transfers/tub transfers and floor transfers to maximize his functional independence in home/community.  Baseline: 10/07/2022= Patient reports difficulty due to pain/weakness at times with transfers 8/12: pt reports being able to stand from floor Modified independent; as long as he has UE support on something sturdy.  Goal  status: in progress.  9/30: reports that transfers improving, but requires UE support to get up from floor. Getting in car without UE, but UE support to exit car.   Goal status: In progress  PLAN:  PT FREQUENCY: 1-2x/week  PT DURATION: 8 weeks  PLANNED INTERVENTIONS: Therapeutic exercises, Therapeutic activity, Neuromuscular re-education, Balance training, Gait training, Patient/Family education, Self Care, Joint mobilization, Joint manipulation, Stair training, Vestibular training, Canalith repositioning, Orthotic/Fit training, DME instructions, Dry Needling, Spinal manipulation, Spinal mobilization, Cryotherapy, Moist heat, Taping, and Manual therapy.   Plan for next session   D/C at this time.   Norman Herrlich PT ,DPT Physical Therapist- [place]  [facility]   3:40 PM 03/12/23

## 2023-03-18 ENCOUNTER — Ambulatory Visit: Payer: Medicare Other | Admitting: Physical Therapy

## 2023-03-25 ENCOUNTER — Other Ambulatory Visit: Payer: Self-pay | Admitting: Internal Medicine

## 2023-03-25 ENCOUNTER — Ambulatory Visit: Payer: Medicare Other | Admitting: Physical Therapy

## 2023-03-25 DIAGNOSIS — I10 Essential (primary) hypertension: Secondary | ICD-10-CM

## 2023-03-26 ENCOUNTER — Other Ambulatory Visit: Payer: Self-pay | Admitting: Internal Medicine

## 2023-03-26 DIAGNOSIS — I10 Essential (primary) hypertension: Secondary | ICD-10-CM

## 2023-03-31 ENCOUNTER — Ambulatory Visit: Payer: Medicare Other | Admitting: Physical Therapy

## 2023-04-01 ENCOUNTER — Ambulatory Visit: Payer: Medicare Other | Admitting: Physical Therapy

## 2023-04-07 ENCOUNTER — Ambulatory Visit: Payer: Medicare Other | Admitting: Physical Therapy

## 2023-04-14 ENCOUNTER — Ambulatory Visit: Payer: Medicare Other | Admitting: Physical Therapy

## 2023-04-21 ENCOUNTER — Ambulatory Visit: Payer: Medicare Other | Admitting: Physical Therapy

## 2023-04-28 ENCOUNTER — Ambulatory Visit: Payer: Medicare Other | Admitting: Physical Therapy

## 2023-05-01 ENCOUNTER — Other Ambulatory Visit: Payer: Self-pay | Admitting: Internal Medicine

## 2023-05-01 DIAGNOSIS — I10 Essential (primary) hypertension: Secondary | ICD-10-CM

## 2023-05-03 ENCOUNTER — Other Ambulatory Visit: Payer: Self-pay | Admitting: Internal Medicine

## 2023-05-03 DIAGNOSIS — I10 Essential (primary) hypertension: Secondary | ICD-10-CM

## 2023-05-05 ENCOUNTER — Ambulatory Visit: Payer: Medicare Other | Admitting: Physical Therapy

## 2023-05-10 ENCOUNTER — Other Ambulatory Visit: Payer: Self-pay | Admitting: Internal Medicine

## 2023-05-10 DIAGNOSIS — I10 Essential (primary) hypertension: Secondary | ICD-10-CM

## 2023-05-10 DIAGNOSIS — E785 Hyperlipidemia, unspecified: Secondary | ICD-10-CM

## 2023-05-12 ENCOUNTER — Ambulatory Visit: Payer: Medicare Other | Admitting: Physical Therapy

## 2023-05-19 ENCOUNTER — Ambulatory Visit: Payer: Medicare Other | Admitting: Physical Therapy

## 2023-05-19 ENCOUNTER — Other Ambulatory Visit: Payer: Self-pay | Admitting: Internal Medicine

## 2023-05-19 DIAGNOSIS — E785 Hyperlipidemia, unspecified: Secondary | ICD-10-CM

## 2023-05-19 DIAGNOSIS — I10 Essential (primary) hypertension: Secondary | ICD-10-CM

## 2023-05-26 ENCOUNTER — Ambulatory Visit: Payer: Medicare Other | Admitting: Physical Therapy

## 2023-06-02 ENCOUNTER — Ambulatory Visit: Payer: Medicare Other | Admitting: Physical Therapy

## 2023-06-03 ENCOUNTER — Ambulatory Visit: Payer: Medicare Other | Admitting: Internal Medicine

## 2023-06-06 ENCOUNTER — Other Ambulatory Visit: Payer: Self-pay | Admitting: Internal Medicine

## 2023-06-06 DIAGNOSIS — I10 Essential (primary) hypertension: Secondary | ICD-10-CM

## 2023-06-06 DIAGNOSIS — L309 Dermatitis, unspecified: Secondary | ICD-10-CM

## 2023-06-15 ENCOUNTER — Other Ambulatory Visit: Payer: Self-pay | Admitting: Internal Medicine

## 2023-06-15 DIAGNOSIS — E785 Hyperlipidemia, unspecified: Secondary | ICD-10-CM

## 2023-06-15 DIAGNOSIS — I10 Essential (primary) hypertension: Secondary | ICD-10-CM

## 2023-06-16 ENCOUNTER — Other Ambulatory Visit: Payer: Self-pay | Admitting: Internal Medicine

## 2023-06-16 DIAGNOSIS — I10 Essential (primary) hypertension: Secondary | ICD-10-CM

## 2023-06-16 DIAGNOSIS — E785 Hyperlipidemia, unspecified: Secondary | ICD-10-CM

## 2023-07-01 ENCOUNTER — Ambulatory Visit (INDEPENDENT_AMBULATORY_CARE_PROVIDER_SITE_OTHER): Payer: Medicare Other | Admitting: Internal Medicine

## 2023-07-01 ENCOUNTER — Encounter: Payer: Self-pay | Admitting: Internal Medicine

## 2023-07-01 VITALS — BP 144/94 | HR 66 | Temp 97.4°F | Resp 16 | Ht 71.0 in | Wt 182.2 lb

## 2023-07-01 DIAGNOSIS — L309 Dermatitis, unspecified: Secondary | ICD-10-CM | POA: Diagnosis not present

## 2023-07-01 DIAGNOSIS — I10 Essential (primary) hypertension: Secondary | ICD-10-CM | POA: Diagnosis not present

## 2023-07-01 DIAGNOSIS — N400 Enlarged prostate without lower urinary tract symptoms: Secondary | ICD-10-CM

## 2023-07-01 DIAGNOSIS — E559 Vitamin D deficiency, unspecified: Secondary | ICD-10-CM

## 2023-07-01 DIAGNOSIS — R001 Bradycardia, unspecified: Secondary | ICD-10-CM | POA: Insufficient documentation

## 2023-07-01 DIAGNOSIS — J4489 Other specified chronic obstructive pulmonary disease: Secondary | ICD-10-CM

## 2023-07-01 DIAGNOSIS — Z0000 Encounter for general adult medical examination without abnormal findings: Secondary | ICD-10-CM

## 2023-07-01 DIAGNOSIS — Z1211 Encounter for screening for malignant neoplasm of colon: Secondary | ICD-10-CM

## 2023-07-01 DIAGNOSIS — H9311 Tinnitus, right ear: Secondary | ICD-10-CM

## 2023-07-01 DIAGNOSIS — E785 Hyperlipidemia, unspecified: Secondary | ICD-10-CM | POA: Diagnosis not present

## 2023-07-01 DIAGNOSIS — B351 Tinea unguium: Secondary | ICD-10-CM

## 2023-07-01 DIAGNOSIS — Z0001 Encounter for general adult medical examination with abnormal findings: Secondary | ICD-10-CM

## 2023-07-01 LAB — HEPATIC FUNCTION PANEL
ALT: 18 U/L (ref 0–53)
AST: 18 U/L (ref 0–37)
Albumin: 4.3 g/dL (ref 3.5–5.2)
Alkaline Phosphatase: 57 U/L (ref 39–117)
Bilirubin, Direct: 0.2 mg/dL (ref 0.0–0.3)
Total Bilirubin: 0.7 mg/dL (ref 0.2–1.2)
Total Protein: 7 g/dL (ref 6.0–8.3)

## 2023-07-01 LAB — BASIC METABOLIC PANEL
BUN: 16 mg/dL (ref 6–23)
CO2: 29 meq/L (ref 19–32)
Calcium: 9.6 mg/dL (ref 8.4–10.5)
Chloride: 99 meq/L (ref 96–112)
Creatinine, Ser: 0.72 mg/dL (ref 0.40–1.50)
GFR: 93.11 mL/min (ref >60.00)
Glucose, Bld: 88 mg/dL (ref 70–99)
Potassium: 3.7 meq/L (ref 3.5–5.1)
Sodium: 137 meq/L (ref 135–145)

## 2023-07-01 LAB — URINALYSIS, ROUTINE W REFLEX MICROSCOPIC
Bilirubin Urine: NEGATIVE
Hgb urine dipstick: NEGATIVE
Ketones, ur: NEGATIVE
Leukocytes,Ua: NEGATIVE
Nitrite: NEGATIVE
RBC / HPF: NONE SEEN (ref 0–?)
Specific Gravity, Urine: 1.025 (ref 1.000–1.030)
Total Protein, Urine: NEGATIVE
Urine Glucose: NEGATIVE
Urobilinogen, UA: 0.2 (ref 0.0–1.0)
pH: 6 (ref 5.0–8.0)

## 2023-07-01 LAB — CBC WITH DIFFERENTIAL/PLATELET
Basophils Absolute: 0 10*3/uL (ref 0.0–0.1)
Basophils Relative: 0.6 % (ref 0.0–3.0)
Eosinophils Absolute: 0.2 10*3/uL (ref 0.0–0.7)
Eosinophils Relative: 3.2 % (ref 0.0–5.0)
HCT: 41.6 % (ref 39.0–52.0)
Hemoglobin: 13.7 g/dL (ref 13.0–17.0)
Lymphocytes Relative: 36.1 % (ref 12.0–46.0)
Lymphs Abs: 2 10*3/uL (ref 0.7–4.0)
MCHC: 32.9 g/dL (ref 30.0–36.0)
MCV: 100 fl (ref 78.0–100.0)
Monocytes Absolute: 0.5 10*3/uL (ref 0.1–1.0)
Monocytes Relative: 10 % (ref 3.0–12.0)
Neutro Abs: 2.8 10*3/uL (ref 1.4–7.7)
Neutrophils Relative %: 50.1 % (ref 43.0–77.0)
Platelets: 221 10*3/uL (ref 150.0–400.0)
RBC: 4.16 Mil/uL — ABNORMAL LOW (ref 4.22–5.81)
RDW: 12.4 % (ref 11.5–15.5)
WBC: 5.5 10*3/uL (ref 4.0–10.5)

## 2023-07-01 LAB — LIPID PANEL
Cholesterol: 140 mg/dL (ref 0–200)
HDL: 58.8 mg/dL (ref >39.00)
LDL Cholesterol: 71 mg/dL (ref 0–99)
NonHDL: 81.28
Total CHOL/HDL Ratio: 2
Triglycerides: 49 mg/dL (ref 0.0–149.0)
VLDL: 9.8 mg/dL (ref 0.0–40.0)

## 2023-07-01 LAB — PSA: PSA: 1.53 ng/mL (ref 0.10–4.00)

## 2023-07-01 LAB — TSH: TSH: 1.84 u[IU]/mL (ref 0.35–5.50)

## 2023-07-01 LAB — VITAMIN D 25 HYDROXY (VIT D DEFICIENCY, FRACTURES): VITD: 38.66 ng/mL (ref 30.00–100.00)

## 2023-07-01 MED ORDER — ATORVASTATIN CALCIUM 10 MG PO TABS
10.0000 mg | ORAL_TABLET | Freq: Every day | ORAL | 0 refills | Status: DC
Start: 2023-07-01 — End: 2023-10-06

## 2023-07-01 MED ORDER — BREZTRI AEROSPHERE 160-9-4.8 MCG/ACT IN AERO: 2.0000 | INHALATION_SPRAY | Freq: Two times a day (BID) | RESPIRATORY_TRACT | 5 refills | Status: DC

## 2023-07-01 MED ORDER — FLUOCINONIDE 0.05 % EX CREA: TOPICAL_CREAM | Freq: Two times a day (BID) | CUTANEOUS | 2 refills | Status: AC

## 2023-07-01 MED ORDER — INDAPAMIDE 1.25 MG PO TABS: 1.2500 mg | ORAL_TABLET | Freq: Every day | ORAL | 0 refills | Status: DC

## 2023-07-01 MED ORDER — CARVEDILOL 3.125 MG PO TABS: 3.1250 mg | ORAL_TABLET | Freq: Two times a day (BID) | ORAL | 0 refills | Status: DC

## 2023-07-01 NOTE — Progress Notes (Unsigned)
 Subjective:  Patient ID: Larry Blair, male    DOB: Jul 26, 1955  Age: 68 y.o. MRN: [id number]  CC: Annual Exam, Hypertension, Rash, and Hyperlipidemia   HPI Larry Blair presents for a CPX and f/up ----  Discussed the use of AI scribe software for clinical note transcription with the patient, who gave verbal consent to proceed.  History of Present Illness   Larry Blair is a 68 year old male who presents for a routine follow-up visit.  He has elevated blood pressure at 156/100 mmHg, which he attributes to recent marijuana use. No headaches, blurred vision, chest pain, or shortness of breath. He drinks beer and occasionally consumes liquor. He does not smoke cigarettes but uses marijuana.  He is due for a colonoscopy, which was initially scheduled for his last fall but postponed due to personal circumstances, including discovering a previously unknown daughter and meeting her family, causing a delay in medical appointments.  Overall, he feels well and denies experiencing any headaches, blurred vision, chest pain, or shortness of breath.       Outpatient Medications Prior to Visit  Medication Sig Dispense Refill   acetaminophen (TYLENOL) 650 MG CR tablet Take 1,300 mg by mouth every 8 (eight) hours as needed for pain.     albuterol (VENTOLIN HFA) 108 (90 Base) MCG/ACT inhaler Inhale 2 puffs into the lungs every 6 (six) hours as needed for wheezing or shortness of breath. 18 g 3   aspirin 81 MG chewable tablet Chew 81 mg by mouth daily.     bisacodyl (DULCOLAX) 5 MG EC tablet Take 5 mg by mouth See admin instructions. Take 5 mg daily, may take a second 5 mg dose as needed for constipation     Docusate Sodium (COLACE PO) Take 1 tablet by mouth daily at 6 (six) AM.     fexofenadine (ALLEGRA) 180 MG tablet Take 180 mg by mouth daily.     Multiple Vitamin (MULTIVITAMIN WITH MINERALS) TABS tablet Take 1 tablet by mouth daily. Centrum Silver     atorvastatin  (LIPITOR) 10 MG tablet Take 1 tablet by mouth once daily 30 tablet 0   Budeson-Glycopyrrol-Formoterol (BREZTRI AEROSPHERE) 160-9-4.8 MCG/ACT AERO INHALE 2 PUFFS INTO THE LUNGS TWICE DAILY 33 g 5   carvedilol (COREG) 6.25 MG tablet TAKE 1 TABLET BY MOUTH TWICE DAILY WITH A MEAL 60 tablet 0   fluocinonide cream (LIDEX) 0.05 % APPLY  CREAM EXTERNALLY TWICE DAILY 120 g 0   indapamide (LOZOL) 1.25 MG tablet Take 1 tablet by mouth once daily 30 tablet 0   No facility-administered medications prior to visit.    ROS Review of Systems  Objective:  BP (!) 144/94 (BP Location: Right Arm, Patient Position: Sitting, Cuff Size: Normal)   Pulse 66   Temp (!) 97.4 F (36.3 C) (Oral)   Resp 16   Ht 5\' 11"  (1.803 m)   Wt 182 lb 3.2 oz (82.6 kg)   SpO2 99%   BMI 25.41 kg/m   BP Readings from Last 3 Encounters:  07/01/23 (!) 144/94  07/04/22 132/82  08/15/21 138/86    Wt Readings from Last 3 Encounters:  07/01/23 182 lb 3.2 oz (82.6 kg)  11/25/22 185 lb (83.9 kg)  11/12/22 186 lb (84.4 kg)    Physical Exam Cardiovascular:     Rate and Rhythm: Regular rhythm. Bradycardia present.     Comments: EKG- SB, 51 bpm No LVH, Q waves, or  ST/T wave changes  Musculoskeletal:     Right lower leg: No edema.     Left lower leg: No edema.     Lab Results  Component Value Date   WBC 5.5 07/01/2023   HGB 13.7 07/01/2023   HCT 41.6 07/01/2023   PLT 221.0 07/01/2023   GLUCOSE 88 07/01/2023   CHOL 140 07/01/2023   TRIG 49.0 07/01/2023   HDL 58.80 07/01/2023   LDLCALC 71 07/01/2023   ALT 18 07/01/2023   AST 18 07/01/2023   NA 137 07/01/2023   K 3.7 07/01/2023   CL 99 07/01/2023   CREATININE 0.72 07/01/2023   BUN 16 07/01/2023   CO2 29 07/01/2023   TSH 1.84 07/01/2023   PSA 1.53 07/01/2023   INR 1.0 08/30/2019    DG C-Arm 1-60 Min Result Date: 08/30/2019 CLINICAL DATA:  C-spine fusion EXAM: CERVICAL SPINE - 2-3 VIEW; DG C-ARM 1-60 MIN COMPARISON:  MRI 08/26/2019 FINDINGS: Three low  resolution intraoperative spot views of the cervical spine. Total fluoroscopy time was 7 seconds. Initial image demonstrates localizing instrument anterior to C4. Subsequent images demonstrate placement of surgical plate and fixating screws with interbody device at C3-C4. IMPRESSION: Intraoperative fluoroscopic assistance provided during cervical spine surgery. Electronically Signed   By: Jasmine Pang M.D.   On: 08/30/2019 16:48   DG Cervical Spine 2-3 Views Result Date: 08/30/2019 CLINICAL DATA:  C-spine fusion EXAM: CERVICAL SPINE - 2-3 VIEW; DG C-ARM 1-60 MIN COMPARISON:  MRI 08/26/2019 FINDINGS: Three low resolution intraoperative spot views of the cervical spine. Total fluoroscopy time was 7 seconds. Initial image demonstrates localizing instrument anterior to C4. Subsequent images demonstrate placement of surgical plate and fixating screws with interbody device at C3-C4. IMPRESSION: Intraoperative fluoroscopic assistance provided during cervical spine surgery. Electronically Signed   By: Jasmine Pang M.D.   On: 08/30/2019 16:48   Chest 2 View Result Date: 08/30/2019 CLINICAL DATA:  Preoperative study for cervical fusion. EXAM: CHEST - 2 VIEW COMPARISON:  None. FINDINGS: The heart size and mediastinal contours are within normal limits. Normal pulmonary vascularity. No focal consolidation, pleural effusion, or pneumothorax. No acute osseous abnormality. IMPRESSION: No active cardiopulmonary disease. Electronically Signed   By: Obie Dredge M.D.   On: 08/30/2019 12:22    Assessment & Plan:  Vitamin D deficiency -     VITAMIN D 25 Hydroxy (Vit-D Deficiency, Fractures); Future  Hyperlipidemia LDL goal <70 -     Lipid panel; Future -     TSH; Future -     Hepatic function panel; Future -     Atorvastatin Calcium; Take 1 tablet (10 mg total) by mouth daily.  Dispense: 90 tablet; Refill: 0  Chronic eczema of hand -     Fluocinonide; Apply topically 2 (two) times daily.  Dispense: 120 g;  Refill: 2  Essential hypertension -     CBC with Differential/Platelet; Future -     Urinalysis, Routine w reflex microscopic; Future -     Hepatic function panel; Future -     Basic metabolic panel; Future -     EKG 12-Lead -     Indapamide; Take 1 tablet (1.25 mg total) by mouth daily.  Dispense: 90 tablet; Refill: 0 -     Carvedilol; Take 1 tablet (3.125 mg total) by mouth 2 (two) times daily with a meal.  Dispense: 180 tablet; Refill: 0 -     AMB Referral VBCI Care Management  Benign prostatic hyperplasia without lower urinary tract symptoms -  PSA; Future -     Urinalysis, Routine w reflex microscopic; Future  Encounter for general adult medical examination with abnormal findings  Bradycardia on ECG  Screening for colon cancer -     Ambulatory referral to Gastroenterology  COPD with asthma (HCC) -     Breztri Aerosphere; Inhale 2 puffs into the lungs 2 (two) times daily.  Dispense: 33 g; Refill: 5 -     AMB Referral VBCI Care Management  Onychomycosis of toenail -     Ambulatory referral to Podiatry     Follow-up: Return in about 3 months (around 10/01/2023).  Sanda Linger, MD

## 2023-07-01 NOTE — Patient Instructions (Signed)
 Health Maintenance, Male  Adopting a healthy lifestyle and getting preventive care are important in promoting health and wellness. Ask your health care provider about:  The right schedule for you to have regular tests and exams.  Things you can do on your own to prevent diseases and keep yourself healthy.  What should I know about diet, weight, and exercise?  Eat a healthy diet    Eat a diet that includes plenty of vegetables, fruits, low-fat dairy products, and lean protein.  Do not eat a lot of foods that are high in solid fats, added sugars, or sodium.  Maintain a healthy weight  Body mass index (BMI) is a measurement that can be used to identify possible weight problems. It estimates body fat based on height and weight. Your health care provider can help determine your BMI and help you achieve or maintain a healthy weight.  Get regular exercise  Get regular exercise. This is one of the most important things you can do for your health. Most adults should:  Exercise for at least 150 minutes each week. The exercise should increase your heart rate and make you sweat (moderate-intensity exercise).  Do strengthening exercises at least twice a week. This is in addition to the moderate-intensity exercise.  Spend less time sitting. Even light physical activity can be beneficial.  Watch cholesterol and blood lipids  Have your blood tested for lipids and cholesterol at 70 years of age, then have this test every 5 years.  You may need to have your cholesterol levels checked more often if:  Your lipid or cholesterol levels are high.  You are older than 70 years of age.  You are at high risk for heart disease.  What should I know about cancer screening?  Many types of cancers can be detected early and may often be prevented. Depending on your health history and family history, you may need to have cancer screening at various ages. This may include screening for:  Colorectal cancer.  Prostate cancer.  Skin cancer.  Lung  cancer.  What should I know about heart disease, diabetes, and high blood pressure?  Blood pressure and heart disease  High blood pressure causes heart disease and increases the risk of stroke. This is more likely to develop in people who have high blood pressure readings or are overweight.  Talk with your health care provider about your target blood pressure readings.  Have your blood pressure checked:  Every 3-5 years if you are 70-70 years of age.  Every year if you are 70 years old or older.  If you are between the ages of 29 and 29 and are a current or former smoker, ask your health care provider if you should have a one-time screening for abdominal aortic aneurysm (AAA).  Diabetes  Have regular diabetes screenings. This checks your fasting blood sugar level. Have the screening done:  Once every three years after age 23 if you are at a normal weight and have a low risk for diabetes.  More often and at a younger age if you are overweight or have a high risk for diabetes.  What should I know about preventing infection?  Hepatitis B  If you have a higher risk for hepatitis B, you should be screened for this virus. Talk with your health care provider to find out if you are at risk for hepatitis B infection.  Hepatitis C  Blood testing is recommended for:  Everyone born from [birth year] through 1967.  Anyone  with known risk factors for hepatitis C.  Sexually transmitted infections (STIs)  You should be screened each year for STIs, including gonorrhea and chlamydia, if:  You are sexually active and are younger than 70 years of age.  You are older than 70 years of age and your health care provider tells you that you are at risk for this type of infection.  Your sexual activity has changed since you were last screened, and you are at increased risk for chlamydia or gonorrhea. Ask your health care provider if you are at risk.  Ask your health care provider about whether you are at high risk for HIV. Your health care provider  may recommend a prescription medicine to help prevent HIV infection. If you choose to take medicine to prevent HIV, you should first get tested for HIV. You should then be tested every 3 months for as long as you are taking the medicine.  Follow these instructions at home:  Alcohol use  Do not drink alcohol if your health care provider tells you not to drink.  If you drink alcohol:  Limit how much you have to 0-2 drinks a day.  Know how much alcohol is in your drink. In the [place] one drink equals one 12 oz bottle of beer (355 mL), one 5 oz glass of wine (148 mL), or one 1 oz glass of hard liquor (44 mL).  Lifestyle  Do not use any products that contain nicotine or tobacco. These products include cigarettes, chewing tobacco, and vaping devices, such as e-cigarettes. If you need help quitting, ask your health care provider.  Do not use street drugs.  Do not share needles.  Ask your health care provider for help if you need support or information about quitting drugs.  General instructions  Schedule regular health, dental, and eye exams.  Stay current with your vaccines.  Tell your health care provider if:  You often feel depressed.  You have ever been abused or do not feel safe at home.  Summary  Adopting a healthy lifestyle and getting preventive care are important in promoting health and wellness.  Follow your health care provider's instructions about healthy diet, exercising, and getting tested or screened for diseases.  Follow your health care provider's instructions on monitoring your cholesterol and blood pressure.  This information is not intended to replace advice given to you by your health care provider. Make sure you discuss any questions you have with your health care provider.  Document Revised: 08/14/2020 Document Reviewed: 08/14/2020  Elsevier Patient Education  2024 [employer].

## 2023-07-02 DIAGNOSIS — B351 Tinea unguium: Secondary | ICD-10-CM | POA: Insufficient documentation

## 2023-07-02 DIAGNOSIS — H9311 Tinnitus, right ear: Secondary | ICD-10-CM | POA: Insufficient documentation

## 2023-07-03 ENCOUNTER — Telehealth: Payer: Self-pay | Admitting: *Deleted

## 2023-07-03 ENCOUNTER — Encounter: Payer: Self-pay | Admitting: Internal Medicine

## 2023-07-03 NOTE — Progress Notes (Signed)
 Care Guide Pharmacy Note  07/03/2023 Name: Rashad Auld MRN: [id number] DOB: 1956/03/07  Referred By: Etta Grandchild, MD Reason for referral: Care Coordination (Outreach to schedule referral with pharmacist )   Kion Huntsberry is a 68 y.o. year old male who is a primary care patient of Yetta Barre Bernadene Bell, MD.  [name] was referred to the pharmacist for assistance related to: HTN  An unsuccessful telephone outreach was attempted today to contact the patient who was referred to the pharmacy team for assistance with medication management. Additional attempts will be made to contact the patient.  Burman Nieves, CMA [place]  [facility], [facility] Guide Direct Dial: [phone]  Fax: [phone] Website: [email or web address]

## 2023-07-07 NOTE — Progress Notes (Unsigned)
 Care Guide Pharmacy Note  07/07/2023 Name: Larry Blair MRN: [id number] DOB: Sep 13, 1955  Referred By: Etta Grandchild, MD Reason for referral: Care Coordination (Outreach to schedule referral with pharmacist )   Larry Blair is a 68 y.o. year old male who is a primary care patient of Yetta Barre Bernadene Bell, MD.  Larry Blair was referred to the pharmacist for assistance related to: HTN  A second unsuccessful telephone outreach was attempted today to contact the patient who was referred to the pharmacy team for assistance with medication management. Additional attempts will be made to contact the patient.  Burman Nieves, CMA, Care Guide [facility]  [facility], [facility] Guide Direct Dial: [phone]  Fax: [phone] Website: [email or web address]

## 2023-07-08 NOTE — Progress Notes (Signed)
 Care Guide Pharmacy Note  07/08/2023 Name: Larry Blair MRN: [id number] DOB: 04-06-1956  Referred By: Etta Grandchild, MD Reason for referral: Care Coordination (Outreach to schedule referral with pharmacist )   Larry Blair is a 68 y.o. year old male who is a primary care patient of Yetta Barre Bernadene Bell, MD.  [name] was referred to the pharmacist for assistance related to: HTN  A third unsuccessful telephone outreach was attempted today to contact the patient who was referred to the pharmacy team for assistance with medication management. The Population Health team is pleased to engage with this patient at any time in the future upon receipt of referral and should he/she be interested in assistance from the Population Health team.  Burman Nieves, CMA [facility]  [facility], [facility] Guide Direct Dial: [phone]  Fax: [phone] Website: [email or web address]

## 2023-07-11 ENCOUNTER — Encounter (INDEPENDENT_AMBULATORY_CARE_PROVIDER_SITE_OTHER): Payer: Self-pay | Admitting: Otolaryngology

## 2023-07-16 ENCOUNTER — Encounter: Payer: Self-pay | Admitting: Gastroenterology

## 2023-07-17 ENCOUNTER — Ambulatory Visit: Admitting: Podiatry

## 2023-07-18 ENCOUNTER — Encounter (INDEPENDENT_AMBULATORY_CARE_PROVIDER_SITE_OTHER): Payer: Self-pay | Admitting: Otolaryngology

## 2023-07-31 ENCOUNTER — Ambulatory Visit: Admitting: Podiatry

## 2023-07-31 DIAGNOSIS — B351 Tinea unguium: Secondary | ICD-10-CM | POA: Diagnosis not present

## 2023-07-31 DIAGNOSIS — Z79899 Other long term (current) drug therapy: Secondary | ICD-10-CM

## 2023-07-31 NOTE — Progress Notes (Signed)
 Subjective:  Patient ID: Larry Blair, male    DOB: Sep 29, 1955,  MRN: [id number]  Chief Complaint  Patient presents with   Nail Problem    Thick nails     68 y.o. male presents with the above complaint.  Patient presents with thickened onychodystrophy mycotic nail x 10 mild pain on palpation.  Patient would like to discuss treatment options for it he has not seen MRIs prior to seeing me he has tried some topical medication which has not helped would like to discuss oral medication at this time.   Review of Systems: Negative except as noted in the HPI. Denies N/V/F/Ch.  Past Medical History:  Diagnosis Date   Allergy    Arthritis    bilateral shoulders/LEFT knee   Asthma    Eczema    HTN (hypertension)    Hyperlipidemia    Substance abuse (HCC) 1976   daily    Current Outpatient Medications:    acetaminophen  (TYLENOL ) 650 MG CR tablet, Take 1,300 mg by mouth every 8 (eight) hours as needed for pain., Disp: , Rfl:    albuterol  (VENTOLIN  HFA) 108 (90 Base) MCG/ACT inhaler, Inhale 2 puffs into the lungs every 6 (six) hours as needed for wheezing or shortness of breath., Disp: 18 g, Rfl: 3   aspirin 81 MG chewable tablet, Chew 81 mg by mouth daily., Disp: , Rfl:    atorvastatin  (LIPITOR) 10 MG tablet, Take 1 tablet (10 mg total) by mouth daily., Disp: 90 tablet, Rfl: 0   bisacodyl (DULCOLAX) 5 MG EC tablet, Take 5 mg by mouth See admin instructions. Take 5 mg daily, may take a second 5 mg dose as needed for constipation, Disp: , Rfl:    budeson-glycopyrrolate-formoterol (BREZTRI  AEROSPHERE) 160-9-4.8 MCG/ACT AERO, Inhale 2 puffs into the lungs 2 (two) times daily., Disp: 33 g, Rfl: 5   carvedilol  (COREG ) 3.125 MG tablet, Take 1 tablet (3.125 mg total) by mouth 2 (two) times daily with a meal., Disp: 180 tablet, Rfl: 0   Docusate Sodium (COLACE PO), Take 1 tablet by mouth daily at 6 (six) AM., Disp: , Rfl:    fexofenadine (ALLEGRA) 180 MG tablet, Take 180 mg by mouth daily., Disp: ,  Rfl:    fluocinonide  cream (LIDEX ) 0.05 %, Apply topically 2 (two) times daily., Disp: 120 g, Rfl: 2   indapamide  (LOZOL ) 1.25 MG tablet, Take 1 tablet (1.25 mg total) by mouth daily., Disp: 90 tablet, Rfl: 0   Multiple Vitamin (MULTIVITAMIN WITH MINERALS) TABS tablet, Take 1 tablet by mouth daily. Centrum Silver, Disp: , Rfl:   Social History   Tobacco Use  Smoking Status Former   Current packs/day: 0.00   Types: Cigarettes, Cigars   Quit date: 06/30/2012   Years since quitting: 11.0  Smokeless Tobacco Former   Types: Chew   Quit date: 07/01/1987    Allergies  Allergen Reactions   Elemental Sulfur    Objective:  There were no vitals filed for this visit. There is no height or weight on file to calculate BMI. Constitutional Well developed. Well nourished.  Vascular Dorsalis pedis pulses palpable bilaterally. Posterior tibial pulses palpable bilaterally. Capillary refill normal to all digits.  No cyanosis or clubbing noted. Pedal hair growth normal.  Neurologic Normal speech. Oriented to person, place, and time. Epicritic sensation to light touch grossly present bilaterally.  Dermatologic Nails thickened onychodystrophy mycotic toenails x 10 Skin within the limits  Orthopedic: Normal joint ROM without pain or crepitus bilaterally. No visible deformities. No bony  tenderness.   Radiographs: None Assessment:   1. Long-term use of high-risk medication   2. Nail fungus   3. Onychomycosis due to dermatophyte    Plan:  Patient was evaluated and treated and all questions answered.  Toenails x 10 onychomycosis -Educated the patient on the etiology of onychomycosis and various treatment options associated with improving the fungal load.  I explained to the patient that there is 3 treatment options available to treat the onychomycosis including topical, p.o., laser treatment.  Patient elected to undergo p.o. options with Lamisil/terbinafine therapy.  In order for me to start the  medication therapy, I explained to the patient the importance of evaluating the liver and obtaining the liver function test.  Once the liver function test comes back normal I will start him on 36-month course of Lamisil therapy.  Patient understood all risk and would like to proceed with Lamisil therapy.  I have asked the patient to immediately stop the Lamisil therapy if she has any reactions to it and call the office or go to the emergency room right away.  Patient states understanding   No follow-ups on file.

## 2023-08-01 LAB — HEPATIC FUNCTION PANEL
ALT: 29 IU/L (ref 0–44)
AST: 21 IU/L (ref 0–40)
Albumin: 4.5 g/dL (ref 3.9–4.9)
Alkaline Phosphatase: 62 IU/L (ref 44–121)
Bilirubin Total: 0.4 mg/dL (ref 0.0–1.2)
Bilirubin, Direct: 0.19 mg/dL (ref 0.00–0.40)
Total Protein: 6.9 g/dL (ref 6.0–8.5)

## 2023-08-01 MED ORDER — TERBINAFINE HCL 250 MG PO TABS: 250.0000 mg | ORAL_TABLET | Freq: Every day | ORAL | 0 refills | Status: DC

## 2023-08-01 NOTE — Addendum Note (Signed)
 Addended by: Saman Giddens on: 08/01/2023 07:55 AM   Modules accepted: Orders

## 2023-08-11 ENCOUNTER — Ambulatory Visit (AMBULATORY_SURGERY_CENTER): Admitting: *Deleted

## 2023-08-11 VITALS — Ht 71.0 in | Wt 186.0 lb

## 2023-08-11 DIAGNOSIS — Z800 Family history of malignant neoplasm of digestive organs: Secondary | ICD-10-CM

## 2023-08-11 MED ORDER — PEG 3350-KCL-NA BICARB-NACL 420 G PO SOLR: 4000.0000 mL | Freq: Once | ORAL | 0 refills | Status: AC

## 2023-08-11 NOTE — Progress Notes (Signed)
 Pre visit completed over telephone . Instructions sent through MyChart and mailed.     No egg or soy allergy known to patient  No issues known to pt with past sedation with any surgeries or procedures Patient denies ever being told they had issues or difficulty with intubation  No FH of Malignant Hyperthermia Pt is not on diet pills Pt is not on  home 02  Pt is not on blood thinners  Pt denies issues with constipation  No A fib or A flutter Have any cardiac testing pending-- NO Pt instructed to use [email or web address] or GoodRx for a price reduction on prep

## 2023-08-14 ENCOUNTER — Encounter: Payer: Self-pay | Admitting: Gastroenterology

## 2023-08-28 ENCOUNTER — Encounter: Payer: Self-pay | Admitting: Gastroenterology

## 2023-08-28 ENCOUNTER — Ambulatory Visit (AMBULATORY_SURGERY_CENTER): Admitting: Gastroenterology

## 2023-08-28 VITALS — BP 141/93 | HR 67 | Temp 97.3°F | Resp 9 | Ht 71.0 in | Wt 186.0 lb

## 2023-08-28 DIAGNOSIS — Z800 Family history of malignant neoplasm of digestive organs: Secondary | ICD-10-CM

## 2023-08-28 DIAGNOSIS — D120 Benign neoplasm of cecum: Secondary | ICD-10-CM

## 2023-08-28 DIAGNOSIS — Z1211 Encounter for screening for malignant neoplasm of colon: Secondary | ICD-10-CM

## 2023-08-28 DIAGNOSIS — K648 Other hemorrhoids: Secondary | ICD-10-CM | POA: Diagnosis not present

## 2023-08-28 MED ORDER — SODIUM CHLORIDE 0.9 % IV SOLN: 500.0000 mL | INTRAVENOUS | Status: DC

## 2023-08-28 NOTE — Op Note (Signed)
 [facility] Patient Name: Larry Blair Procedure Date: 08/28/2023 11:05 AM MRN: [id number] Endoscopist: Ace Abu L. Dominic Friendly , MD, [id number] Age: 68 Referring MD:  Date of Birth: 06/23/1955 Gender: Male Account #: [id number] Procedure:                Colonoscopy Indications:              Colon cancer screening in patient at increased                            risk: Colorectal cancer in father                           no polyps June 2019 Medicines:                Monitored Anesthesia Care Procedure:                Pre-Anesthesia Assessment:                           - Prior to the procedure, a History and Physical                            was performed, and patient medications and                            allergies were reviewed. The patient's tolerance of                            previous anesthesia was also reviewed. The risks                            and benefits of the procedure and the sedation                            options and risks were discussed with the patient.                            All questions were answered, and informed consent                            was obtained. Prior Anticoagulants: The patient has                            taken no anticoagulant or antiplatelet agents. ASA                            Grade Assessment: III - A patient with severe                            systemic disease. After reviewing the risks and                            benefits, the patient was deemed in satisfactory  condition to undergo the procedure.                           After obtaining informed consent, the colonoscope                            was passed under direct vision. Throughout the                            procedure, the patient's blood pressure, pulse, and                            oxygen saturations were monitored continuously. The                            CF [id number] #[id number] was introduced through the anus                             and advanced to the the terminal ileum, with                            identification of the appendiceal orifice and IC                            valve. The colonoscopy was performed without                            difficulty. The patient tolerated the procedure                            well. The quality of the bowel preparation was                            good. The terminal ileum, ileocecal valve,                            appendiceal orifice, and rectum were photographed. Scope In: 11:10:14 AM Scope Out: 11:24:45 AM Scope Withdrawal Time: 0 hours 10 minutes 25 seconds  Total Procedure Duration: 0 hours 14 minutes 31 seconds  Findings:                 The perianal and digital rectal examinations were                            normal.                           Repeat examination of right colon under NBI                            performed.                           A diminutive polyp was found in the cecum. The  polyp was sessile. The polyp was removed with a                            cold snare. Resection and retrieval were complete.                           Internal hemorrhoids were found. The hemorrhoids                            were medium-sized.                           The exam was otherwise without abnormality on                            direct and retroflexion views. Complications:            No immediate complications. Estimated Blood Loss:     Estimated blood loss was minimal. Impression:               - One diminutive polyp in the cecum, removed with a                            cold snare. Resected and retrieved.                           - Internal hemorrhoids.                           - The examination was otherwise normal on direct                            and retroflexion views. Recommendation:           - Patient has a contact number available for                            emergencies. The signs  and symptoms of potential                            delayed complications were discussed with the                            patient. Return to normal activities tomorrow.                            Written discharge instructions were provided to the                            patient.                           - Resume previous diet.                           - Continue present medications.                           -  Await pathology results.                           - Repeat colonoscopy in 5 years for surveillance                            and family history of colon cancer. Lynda Capistran L. Dominic Friendly, MD 08/28/2023 11:28:28 AM This report has been signed electronically.

## 2023-08-28 NOTE — Progress Notes (Signed)
 Called to room to assist during endoscopic procedure.  Patient ID and intended procedure confirmed with present staff. Received instructions for my participation in the procedure from the performing physician.

## 2023-08-28 NOTE — Progress Notes (Signed)
 Sedate, gd SR, tolerated procedure well, VSS, report to RN

## 2023-08-28 NOTE — Patient Instructions (Addendum)
-   1 polyp removed and sent to pathology - Hemorrhoids                           - Resume previous diet. - Continue present medications. - Await pathology results. - Repeat colonoscopy in 5 years for surveillance    and family history of colon cancer.  YOU HAD AN ENDOSCOPIC PROCEDURE TODAY AT THE [facility]:   Refer to the procedure report that was given to you for any specific questions about what was found during the examination.  If the procedure report does not answer your questions, please call your gastroenterologist to clarify.  If you requested that your care partner not be given the details of your procedure findings, then the procedure report has been included in a sealed envelope for you to review at your convenience later.  YOU SHOULD EXPECT: Some feelings of bloating in the abdomen. Passage of more gas than usual.  Walking can help get rid of the air that was put into your GI tract during the procedure and reduce the bloating. If you had a lower endoscopy (such as a colonoscopy or flexible sigmoidoscopy) you may notice spotting of blood in your stool or on the toilet paper. If you underwent a bowel prep for your procedure, you may not have a normal bowel movement for a few days.  Please Note:  You might notice some irritation and congestion in your nose or some drainage.  This is from the oxygen used during your procedure.  There is no need for concern and it should clear up in a day or so.  SYMPTOMS TO REPORT IMMEDIATELY:  Following lower endoscopy (colonoscopy or flexible sigmoidoscopy):  Excessive amounts of blood in the stool  Significant tenderness or worsening of abdominal pains  Swelling of the abdomen that is new, acute  Fever of 100F or higher   For urgent or emergent issues, a gastroenterologist can be reached at any hour by calling [phone]. Do not use MyChart messaging for urgent concerns.    DIET:  We do recommend a small meal at first, but  then you may proceed to your regular diet.  Drink plenty of fluids but you should avoid alcoholic beverages for 24 hours.  ACTIVITY:  You should plan to take it easy for the rest of today and you should NOT DRIVE or use heavy machinery until tomorrow (because of the sedation medicines used during the test).    FOLLOW UP: Our staff will call the number listed on your records the next business day following your procedure.  We will call around 7:15- 8:00 am to check on you and address any questions or concerns that you may have regarding the information given to you following your procedure. If we do not reach you, we will leave a message.     If any biopsies were taken you will be contacted by phone or by letter within the next 1-3 weeks.  Please call us  at [phone] if you have not heard about the biopsies in 3 weeks.    SIGNATURES/CONFIDENTIALITY: You and/or your care partner have signed paperwork which will be entered into your electronic medical record.  These signatures attest to the fact that that the information above on your After Visit Summary has been reviewed and is understood.  Full responsibility of the confidentiality of this discharge information lies with you and/or your care-partner.

## 2023-08-28 NOTE — Progress Notes (Signed)
 History and Physical:  This patient presents for endoscopic testing for: Encounter Diagnosis  Name Primary?   Family history of colon cancer Yes    Screening at in creased risk with Fam Hx CRC No  polyps in 2019 Patient denies chronic abdominal pain, rectal bleeding, constipation or diarrhea.   Patient is otherwise without complaints or active issues today.   Past Medical History: Past Medical History:  Diagnosis Date   Allergy    Arthritis    bilateral shoulders/LEFT knee   Asthma    Eczema    HTN (hypertension)    Hyperlipidemia    Substance abuse (HCC) 1976   daily     Past Surgical History: Past Surgical History:  Procedure Laterality Date   ANTERIOR CERVICAL DECOMP/DISCECTOMY FUSION N/A 08/30/2019   Procedure: CERVICAL THREE-FOUR ANTERIOR CERVICAL DECOMPRESSION/FUSION;  Surgeon: Isadora Mar, MD;  Location: [facility] OR;  Service: Neurosurgery;  Laterality: N/A;   COLONOSCOPY  09/2017   HD-MAC-Plenvu  (exc)-hems   KNEE ARTHROSCOPY Left    WISDOM TOOTH EXTRACTION      Allergies: Allergies  Allergen Reactions   Elemental Sulfur     Outpatient Meds: Current Outpatient Medications  Medication Sig Dispense Refill   acetaminophen  (TYLENOL ) 650 MG CR tablet Take 1,300 mg by mouth every 8 (eight) hours as needed for pain.     albuterol  (VENTOLIN  HFA) 108 (90 Base) MCG/ACT inhaler Inhale 2 puffs into the lungs every 6 (six) hours as needed for wheezing or shortness of breath. 18 g 3   aspirin 81 MG chewable tablet Chew 81 mg by mouth daily.     atorvastatin  (LIPITOR) 10 MG tablet Take 1 tablet (10 mg total) by mouth daily. 90 tablet 0   Docusate Sodium (COLACE PO) Take 1 tablet by mouth daily at 6 (six) AM.     fexofenadine (ALLEGRA) 180 MG tablet Take 180 mg by mouth daily.     indapamide  (LOZOL ) 1.25 MG tablet Take 1 tablet (1.25 mg total) by mouth daily. 90 tablet 0   terbinafine  (LAMISIL ) 250 MG tablet Take 1 tablet (250 mg total) by mouth daily. 90 tablet 0    bisacodyl (DULCOLAX) 5 MG EC tablet Take 5 mg by mouth See admin instructions. Take 5 mg daily, may take a second 5 mg dose as needed for constipation     budeson-glycopyrrolate-formoterol (BREZTRI  AEROSPHERE) 160-9-4.8 MCG/ACT AERO Inhale 2 puffs into the lungs 2 (two) times daily. (Patient not taking: Reported on 08/28/2023) 33 g 5   carvedilol  (COREG ) 3.125 MG tablet Take 1 tablet (3.125 mg total) by mouth 2 (two) times daily with a meal. (Patient not taking: Reported on 08/28/2023) 180 tablet 0   fluocinonide  cream (LIDEX ) 0.05 % Apply topically 2 (two) times daily. 120 g 2   Multiple Vitamin (MULTIVITAMIN WITH MINERALS) TABS tablet Take 1 tablet by mouth daily. Centrum Silver     Current Facility-Administered Medications  Medication Dose Route Frequency Provider Last Rate Last Admin   0.9 %  sodium chloride  infusion  500 mL Intravenous Continuous Kerby Pearson III, MD          ___________________________________________________________________ Objective   Exam:  BP (!) 142/89   Pulse 73   Temp (!) 97.3 F (36.3 C)   Ht 5\' 11"  (1.803 m)   Wt 186 lb (84.4 kg)   SpO2 99%   BMI 25.94 kg/m   CV: regular , S1/S2 Resp: clear to auscultation bilaterally, normal RR and effort noted GI: soft, no tenderness, with active  bowel sounds.   Assessment: Encounter Diagnosis  Name Primary?   Family history of colon cancer Yes     Plan: Colonoscopy   The benefits and risks of the planned procedure(s) were described in detail with the patient or (when appropriate) their health care proxy.  Risks were outlined as including, but not limited to, bleeding, infection, perforation, adverse medication reaction leading to cardiac or pulmonary decompensation, pancreatitis (if ERCP).  The limitation of incomplete mucosal visualization was also discussed.  No guarantees or warranties were given.  The patient is appropriate for an endoscopic procedure in the ambulatory setting.   - Lorella Roles,  MD

## 2023-08-28 NOTE — Progress Notes (Signed)
 Pt's states no medical or surgical changes since previsit or office visit.

## 2023-08-29 ENCOUNTER — Telehealth: Payer: Self-pay | Admitting: *Deleted

## 2023-08-29 NOTE — Telephone Encounter (Signed)
  Follow up Call-     08/28/2023   10:32 AM  Call back number  Post procedure Call Back phone  # [phone]  Permission to leave phone message Yes     Patient questions:  Message left to call if necessary.

## 2023-09-02 LAB — SURGICAL PATHOLOGY

## 2023-09-03 ENCOUNTER — Ambulatory Visit: Payer: Self-pay | Admitting: Gastroenterology

## 2023-10-03 ENCOUNTER — Ambulatory Visit (INDEPENDENT_AMBULATORY_CARE_PROVIDER_SITE_OTHER): Admitting: Audiology

## 2023-10-03 ENCOUNTER — Ambulatory Visit (INDEPENDENT_AMBULATORY_CARE_PROVIDER_SITE_OTHER): Admitting: Otolaryngology

## 2023-10-03 VITALS — BP 142/86 | HR 83 | Ht 71.0 in | Wt 180.0 lb

## 2023-10-03 DIAGNOSIS — H9313 Tinnitus, bilateral: Secondary | ICD-10-CM

## 2023-10-03 DIAGNOSIS — H903 Sensorineural hearing loss, bilateral: Secondary | ICD-10-CM

## 2023-10-03 NOTE — Progress Notes (Signed)
 ENT CONSULT:  Reason for Consult: bilateral tinnitus    HPI: He presents for bilateral ringing in his ears, mild described as buzzing noise. No ear pain or drainage. Hearing is good, not decreased. No vertigo. Audio today with mild symmetric b/l SNHL. Type A tymps.     Past Medical History:  Diagnosis Date   Allergy    Arthritis    bilateral shoulders/LEFT knee   Asthma    Eczema    HTN (hypertension)    Hyperlipidemia    Substance abuse (HCC) 1976   daily    Past Surgical History:  Procedure Laterality Date   ANTERIOR CERVICAL DECOMP/DISCECTOMY FUSION N/A 08/30/2019   Procedure: CERVICAL THREE-FOUR ANTERIOR CERVICAL DECOMPRESSION/FUSION;  Surgeon: Joshua Alm RAMAN, MD;  Location: [facility] OR;  Service: Neurosurgery;  Laterality: N/A;   COLONOSCOPY  09/2017   HD-MAC-Plenvu  (exc)-hems   KNEE ARTHROSCOPY Left    WISDOM TOOTH EXTRACTION      Family History  Problem Relation Age of Onset   Colon polyps Father 81   Colon cancer Father 43   Early death Sister    Kidney failure Sister    Early death Brother    Kidney failure Brother    Tongue cancer Maternal Aunt    Pancreatic cancer Maternal Aunt    Cancer Maternal Grandmother    Cancer Maternal Grandfather    Esophageal cancer Neg Hx    Stomach cancer Neg Hx    Rectal cancer Neg Hx     Social History:  reports that he quit smoking about 11 years ago. His smoking use included cigarettes and cigars. He quit smokeless tobacco use about 36 years ago.  His smokeless tobacco use included chew. He reports current alcohol use of about 21.0 standard drinks of alcohol per week. He reports current drug use. Frequency: 7.00 times per week. Drug: Marijuana.  Allergies:  Allergies  Allergen Reactions   Elemental Sulfur     Medications: I have reviewed the patient's current medications.  The PMH, PSH, Medications, Allergies, and SH were reviewed and updated.  ROS: Constitutional: Negative for fever, weight loss and weight  gain. Cardiovascular: Negative for chest pain and dyspnea on exertion. Respiratory: Is not experiencing shortness of breath at rest. Gastrointestinal: Negative for nausea and vomiting. Neurological: Negative for headaches. Psychiatric: The patient is not nervous/anxious  Blood pressure (!) 142/86, pulse 83, height 5' 11 (1.803 m), weight 180 lb (81.6 kg), SpO2 93%.  PHYSICAL EXAM:  Exam: General: Well-developed, well-nourished Communication and Voice: Clear pitch and clarity Respiratory Respiratory effort: Equal inspiration and expiration without stridor Cardiovascular Peripheral Vascular: Warm extremities with equal color/perfusion Eyes: No nystagmus with equal extraocular motion bilaterally Neuro/Psych/Balance: Patient oriented to person, place, and time; Appropriate mood and affect; Gait is intact with no imbalance; Cranial nerves I-XII are intact Head and Face Inspection: Normocephalic and atraumatic without mass or lesion Palpation: Facial skeleton intact without bony stepoffs Salivary Glands: No mass or tenderness Facial Strength: Facial motility symmetric and full bilaterally ENT Pinna: External ear intact and fully developed External canal: Canal is patent with intact skin Tympanic Membrane: Clear and mobile External Nose: No scar or anatomic deformity Internal Nose: Septum intact and midline. No edema, polyp, or rhinorrhea Lips, Teeth, and gums: Mucosa and teeth intact and viable TMJ: No pain to palpation with full mobility Oral cavity/oropharynx: No erythema or exudate, no lesions present Neck Neck and Trachea: Midline trachea without mass or lesion Thyroid : No mass or nodularity Lymphatics: No lymphadenopathy  Studies Reviewed:  Audio - mild symmetric b/l SNHL affecting high-freq sounds only, otherwise normal hearing. WDS 100% AU. Type A tymps AU  Assessment/Plan: Encounter Diagnoses  Name Primary?   Tinnitus of both ears Yes   Sensorineural hearing loss  (SNHL) of both ears    Sensorineural hearing loss pattern c/w age-related hearing loss, associated with tinnitus. Excellent WDS. Not a candidate for hearing aids. Normal ear exam  - annual hearing test  - tinnitus coping strategies discussed with patient  Thank you for allowing me to participate in the care of this patient. Please do not hesitate to contact me with any questions or concerns.   Elena Larry, MD Otolaryngology [facility] Phone: [phone] Fax: [phone]    10/03/2023, 10:21 AM

## 2023-10-03 NOTE — Progress Notes (Signed)
  [address] [place], [place] [address] [phone]  Audiological Evaluation    Name: Larry Blair     DOB:   14-Sep-1955      MRN:   [id number]                                                                                     Service Date: 10/03/2023     Accompanied by: unaccompanied   Patient comes today after Dr. Soldatova, ENT sent a referral for a hearing evaluation due to concerns with tinnitus.   Symptoms Yes Details  Hearing loss  [x]  Has felt cannot hear form the right ear, but then it seems it gets better.  Tinnitus  [x]  Right sided ringing - mainly at night - noticed for about a year  Ear pain/ infections/pressure  []    Balance problems  []    Noise exposure history  []    Previous ear surgeries  []    Family history of hearing loss  [x]  Niece has an implant.  Amplification  []    Other  []      Otoscopy: Right ear: Occluding cerumen, unable to visualize notable tympanic membrane landmarks. Left ear:  Occluding cerumen, unable to visualize notable tympanic membrane landmarks.  Tympanometry: Right ear: Type A- Normal external ear canal volume with normal middle ear pressure and tympanic membrane compliance. Left ear: Type A- Normal external ear canal volume with normal middle ear pressure and tympanic membrane compliance.   Pure tone Audiometry: Right ear- Normal hearing from [id number] Hz, then mild  sensorineural hearing loss from 4000 Hz - 8000 Hz. Left ear-  Normal hearing from [id number] Hz, then mild  sensorineural hearing loss from 4000 Hz - 8000 Hz.  Speech Audiometry: Right ear- Speech Reception Threshold (SRT) was obtained at 10 dBHL. Left ear-Speech Reception Threshold (SRT) was obtained at 10 dBHL.   Word Recognition Score Tested using NU-6 (recorded) Right ear: 100% was obtained at a presentation level of 50 dBHL with contralateral masking which is deemed as  excellent. Left ear: 100% was obtained at a presentation level of 50 dBHL with  contralateral masking which is deemed as  excellent.   The hearing test results were completed under headphones and results are deemed to be of good to fair reliability. Test technique:  conventional      Recommendations: Follow up with ENT as scheduled for today. Return for a hearing evaluation if concerns with hearing changes arise or per MD recommendation.   Maryn Freelove MARIE LEROUX-MARTINEZ, AUD

## 2023-10-06 ENCOUNTER — Other Ambulatory Visit: Payer: Self-pay | Admitting: Internal Medicine

## 2023-10-06 DIAGNOSIS — I10 Essential (primary) hypertension: Secondary | ICD-10-CM

## 2023-10-06 DIAGNOSIS — E785 Hyperlipidemia, unspecified: Secondary | ICD-10-CM

## 2023-10-14 ENCOUNTER — Encounter: Payer: Self-pay | Admitting: Audiology

## 2023-11-13 ENCOUNTER — Ambulatory Visit: Payer: Medicare Other

## 2023-11-13 VITALS — Ht 71.0 in | Wt 180.0 lb

## 2023-11-13 DIAGNOSIS — Z0000 Encounter for general adult medical examination without abnormal findings: Secondary | ICD-10-CM | POA: Diagnosis not present

## 2023-11-13 NOTE — Progress Notes (Signed)
 Subjective:   Larry Blair is a 68 y.o. who presents for a Medicare Wellness preventive visit.  As a reminder, Annual Wellness Visits don't include a physical exam, and some assessments may be limited, especially if this visit is performed virtually. We may recommend an in-person follow-up visit with your provider if needed.  Visit Complete: Virtual I connected with  Larry Blair on 11/13/23 by a audio enabled telemedicine application and verified that I am speaking with the correct person using two identifiers.  Patient Location: Home  Provider Location: Office/Clinic  I discussed the limitations of evaluation and management by telemedicine. The patient expressed understanding and agreed to proceed.  Vital Signs: Because this visit was a virtual/telehealth visit, some criteria may be missing or patient reported. Any vitals not documented were not able to be obtained and vitals that have been documented are patient reported.  VideoDeclined- This patient declined Librarian, academic. Therefore the visit was completed with audio only.  Persons Participating in Visit: Patient.  AWV Questionnaire: No: Patient Medicare AWV questionnaire was not completed prior to this visit.  Cardiac Risk Factors include: advanced age (>76men, >73 women);hypertension;dyslipidemia;Other (see comment), Risk factor comments: COPD, BPH     Objective:    Today's Vitals   11/13/23 1123  Weight: 180 lb (81.6 kg)  Height: 5' 11 (1.803 m)  PainSc: 3    Body mass index is 25.1 kg/m.     11/13/2023   11:47 AM 11/12/2022   11:22 AM 07/15/2022    3:33 PM 08/26/2019    8:59 AM 09/22/2017    8:00 AM  Advanced Directives  Does Patient Have a Medical Advance Directive? No No No No No   Would patient like information on creating a medical advance directive?  No - Patient declined No - Patient declined  No - Patient declined      Data saved with a previous flowsheet row definition     Current Medications (verified) Outpatient Encounter Medications as of 11/13/2023  Medication Sig   acetaminophen  (TYLENOL ) 650 MG CR tablet Take 1,300 mg by mouth every 8 (eight) hours as needed for pain.   albuterol  (VENTOLIN  HFA) 108 (90 Base) MCG/ACT inhaler Inhale 2 puffs into the lungs every 6 (six) hours as needed for wheezing or shortness of breath.   aspirin 81 MG chewable tablet Chew 81 mg by mouth daily.   atorvastatin  (LIPITOR) 10 MG tablet Take 1 tablet (10 mg total) by mouth daily. TAKE 1 TABLET (10 MG TOTAL) BY MOUTH DAILY. Patient is to follow up with PCP prior to future refills   bisacodyl (DULCOLAX) 5 MG EC tablet Take 5 mg by mouth See admin instructions. Take 5 mg daily, may take a second 5 mg dose as needed for constipation   budeson-glycopyrrolate-formoterol (BREZTRI  AEROSPHERE) 160-9-4.8 MCG/ACT AERO Inhale 2 puffs into the lungs 2 (two) times daily.   Docusate Sodium (COLACE PO) Take 1 tablet by mouth daily at 6 (six) AM.   fexofenadine (ALLEGRA) 180 MG tablet Take 180 mg by mouth daily.   fluocinonide  cream (LIDEX ) 0.05 % Apply topically 2 (two) times daily.   indapamide  (LOZOL ) 1.25 MG tablet Take 1 tablet (1.25 mg total) by mouth daily. TAKE 1 TABLET (1.25 MG TOTAL) BY MOUTH DAILY. Patient is to follow up with PCP prior to future refills   Multiple Vitamin (MULTIVITAMIN WITH MINERALS) TABS tablet Take 1 tablet by mouth daily. Centrum Silver   terbinafine  (LAMISIL ) 250 MG tablet Take 1 tablet (250  mg total) by mouth daily.   carvedilol  (COREG ) 3.125 MG tablet Take 1 tablet (3.125 mg total) by mouth 2 (two) times daily with a meal. (Patient not taking: Reported on 11/13/2023)   No facility-administered encounter medications on file as of 11/13/2023.    Allergies (verified) Elemental sulfur   History: Past Medical History:  Diagnosis Date   Allergy    Arthritis    bilateral shoulders/LEFT knee   Asthma    Eczema    HTN (hypertension)    Hyperlipidemia     Substance abuse (HCC) 1976   daily   Past Surgical History:  Procedure Laterality Date   ANTERIOR CERVICAL DECOMP/DISCECTOMY FUSION N/A 08/30/2019   Procedure: CERVICAL THREE-FOUR ANTERIOR CERVICAL DECOMPRESSION/FUSION;  Surgeon: Joshua Alm RAMAN, MD;  Location[facility] OR;  Service: Neurosurgery;  Laterality: N/A;   COLONOSCOPY  09/2017   HD-MAC-Plenvu  (exc)-hems   KNEE ARTHROSCOPY Left    WISDOM TOOTH EXTRACTION     Family History  Problem Relation Age of Onset   Colon polyps Father 74   Colon cancer Father 1   Early death Sister    Kidney failure Sister    Early death Brother    Kidney failure Brother    Tongue cancer Maternal Aunt    Pancreatic cancer Maternal Aunt    Cancer Maternal Grandmother    Cancer Maternal Grandfather    Esophageal cancer Neg Hx    Stomach cancer Neg Hx    Rectal cancer Neg Hx    Social History   Socioeconomic History   Marital status: Married    Spouse name: Larry Blair   Number of children: 2   Years of education: Not on file   Highest education level: Not on file  Occupational History   Occupation: RETIRED  Tobacco Use   Smoking status: Former    Current packs/day: 0.00    Types: Cigarettes, Cigars    Quit date: 06/30/2012    Years since quitting: 11.3   Smokeless tobacco: Former    Types: Chew    Quit date: 07/01/1987  Vaping Use   Vaping status: Never Used  Substance and Sexual Activity   Alcohol use: Yes    Alcohol/week: 21.0 standard drinks of alcohol    Types: 21 Standard drinks or equivalent per week    Comment: + occasional beer   Drug use: Yes    Frequency: 7.0 times per week    Types: Marijuana    Comment: several times a day   Sexual activity: Yes    Partners: Female  Other Topics Concern   Not on file  Social History Narrative   Lives with spouse /2025   Right hand   Social Drivers of Health   Financial Resource Strain: High Risk (11/13/2023)   Overall Financial Resource Strain (CARDIA)    Difficulty of Paying Living  Expenses: Hard  Food Insecurity: No Food Insecurity (11/13/2023)   Hunger Vital Sign    Worried About Running Out of Food in the Last Year: Never true    Ran Out of Food in the Last Year: Never true  Transportation Needs: No Transportation Needs (11/13/2023)   PRAPARE - Administrator, Civil Service (Medical): No    Lack of Transportation (Non-Medical): No  Physical Activity: Inactive (11/13/2023)   Exercise Vital Sign    Days of Exercise per Week: 0 days    Minutes of Exercise per Session: 0 min  Stress: No Stress Concern Present (11/13/2023)   [employer] of Occupational  Health - Occupational Stress Questionnaire    Feeling of Stress: Not at all  Social Connections: Socially Integrated (11/13/2023)   Social Connection and Isolation Panel    Frequency of Communication with Friends and Family: More than three times a week    Frequency of Social Gatherings with Friends and Family: Once a week    Attends Religious Services: More than 4 times per year    Active Member of [employer] or Organizations: Yes    Attends Banker Meetings: Never    Marital Status: Living with partner    Tobacco Counseling Counseling given: Not Answered    Clinical Intake:  Pre-visit preparation completed: Yes  Pain : 0-10 Pain Score: 3  Pain Type: Chronic pain Pain Location: Shoulder Pain Orientation: Left Pain Radiating Towards: up to the back of neck Pain Descriptors / Indicators: Aching, Discomfort Pain Onset: More than a month ago Pain Frequency: Intermittent (been constant for the pass several days) Pain Relieving Factors: Tylenol  650 mg, diclofeanc topical gel Effect of Pain on Daily Activities: moving around and when he tries to sleep  Pain Relieving Factors: Tylenol  650 mg, diclofeanc topical gel  BMI - recorded: 25.1 Nutritional Status: BMI 25 -29 Overweight Nutritional Risks: None Diabetes: No  No results found for: HGBA1C   How often do you need to have someone  help you when you read instructions, pamphlets, or other written materials from your doctor or pharmacy?: 1 - Never  Interpreter Needed?: No  Information entered by :: Stepanie Graver, RMA   Activities of Daily Living     11/13/2023   11:24 AM  In your present state of health, do you have any difficulty performing the following activities:  Hearing? 0  Vision? 0  Difficulty concentrating or making decisions? 0  Walking or climbing stairs? 0  Dressing or bathing? 0  Doing errands, shopping? 0  Preparing Food and eating ? N  Using the Toilet? N  In the past six months, have you accidently leaked urine? N  Do you have problems with loss of bowel control? N  Managing your Medications? N  Managing your Finances? N  Housekeeping or managing your Housekeeping? N    Patient Care Team: Joshua Debby CROME, MD as PCP - General (Internal Medicine)  I have updated your Care Teams any recent Medical Services you may have received from other providers in the past year.     Assessment:   This is a routine wellness examination for Larry Blair.  Hearing/Vision screen Hearing Screening - Comments:: Just ringing Vision Screening - Comments:: Wears eyeglasses/ [place] ears eyes nose and throat/[place]   Goals Addressed               This Visit's Progress     Patient Stated (pt-stated)        Maintain his health/regain more mobility/2025       Depression Screen     11/13/2023   11:50 AM 07/01/2023    9:54 AM 11/12/2022   11:24 AM 07/04/2022    2:17 PM 10/03/2020    1:57 PM 01/04/2020    3:01 PM 08/26/2019    9:00 AM  PHQ 2/9 Scores  PHQ - 2 Score 0 0 0 0 0 0 0  PHQ- 9 Score 0  0 0       Fall Risk     11/13/2023   11:47 AM 07/01/2023    9:54 AM 11/12/2022   11:23 AM 07/04/2022    2:18 PM 10/03/2020  1:57 PM  Fall Risk   Falls in the past year? 0 0 0 0 1  Number falls in past yr: 0 0 0 0 0  Injury with Fall? 0 0 0 0 1  Comment     scrape  Risk for fall due to :  No Fall Risks No Fall Risks No  Fall Risks Impaired balance/gait  Follow up Falls evaluation completed;Falls prevention discussed Falls evaluation completed Falls prevention discussed Falls evaluation completed     MEDICARE RISK AT HOME:  Medicare Risk at Home Any stairs in or around the home?: Yes (outside steps) If so, are there any without handrails?: No Home free of loose throw rugs in walkways, pet beds, electrical cords, etc?: Yes Adequate lighting in your home to reduce risk of falls?: Yes Life alert?: No Use of a cane, walker or w/c?: Yes Grab bars in the bathroom?: Yes Shower chair or bench in shower?: Yes Elevated toilet seat or a handicapped toilet?: Yes  TIMED UP AND GO:  Was the test performed?  No  Cognitive Function: Declined/Normal: No cognitive concerns noted by patient or family. Patient alert, oriented, able to answer questions appropriately and recall recent events. No signs of memory loss or confusion.        11/12/2022   11:35 AM  6CIT Screen  What Year? 0 points  What month? 0 points  What time? 0 points  Count back from 20 0 points  Months in reverse 0 points  Repeat phrase 0 points  Total Score 0 points    Immunizations Immunization History  Administered Date(s) Administered   Fluad Quad(high Dose 65+) 02/01/2019, 02/10/2020, 02/13/2021   Fluad Trivalent(High Dose 65+) 12/24/2022   Influenza,inj,Quad PF,6+ Mos 06/30/2017, 02/09/2018   PFIZER(Purple Top)SARS-COV-2 Vaccination 05/29/2019, 06/19/2019, 03/25/2020   Pfizer Covid-19 Vaccine Bivalent Booster 54yrs & up 02/02/2021, 08/16/2021   Pneumococcal Conjugate-13 08/23/2019   Pneumococcal Polysaccharide-23 09/22/2020   Tdap 06/30/2017   Zoster Recombinant(Shingrix ) 09/22/2020, 08/16/2021    Screening Tests Health Maintenance  Topic Date Due   COVID-19 Vaccine (6 - 2024-25 season) 12/08/2022   INFLUENZA VACCINE  11/07/2023   Medicare Annual Wellness (AWV)  11/12/2024   DTaP/Tdap/Td (2 - Td or Tdap) 07/01/2027    Colonoscopy  08/27/2028   Pneumococcal Vaccine: 50+ Years  Completed   Hepatitis C Screening  Completed   Zoster Vaccines- Shingrix   Completed   Hepatitis B Vaccines  Aged Out   HPV VACCINES  Aged Out   Meningococcal B Vaccine  Aged Out    Health Maintenance  Health Maintenance Due  Topic Date Due   COVID-19 Vaccine (6 - 2024-25 season) 12/08/2022   INFLUENZA VACCINE  11/07/2023   Health Maintenance Items Addressed: See Nurse Notes at the end of this note  Additional Screening:  Vision Screening: Recommended annual ophthalmology exams for early detection of glaucoma and other disorders of the eye. Would you like a referral to an eye doctor? No    Dental Screening: Recommended annual dental exams for proper oral hygiene  Community Resource Referral / Chronic Care Management: CRR required this visit?  No   CCM required this visit?  No   Plan:    I have personally reviewed and noted the following in the patient's chart:   Medical and social history Use of alcohol, tobacco or illicit drugs  Current medications and supplements including opioid prescriptions. Patient is not currently taking opioid prescriptions. Functional ability and status Nutritional status Physical activity Advanced directives List of other  physicians Hospitalizations, surgeries, and ER visits in previous 12 months Vitals Screenings to include cognitive, depression, and falls Referrals and appointments  In addition, I have reviewed and discussed with patient certain preventive protocols, quality metrics, and best practice recommendations. A written personalized care plan for preventive services as well as general preventive health recommendations were provided to patient.   Chyanna Flock L Lee Kuang, CMA   11/13/2023   After Visit Summary: (MyChart) Due to this being a telephonic visit, the after visit summary with patients personalized plan was offered to patient via MyChart   Notes: Patient is up to date  on all health maintenance with no concerns to address today.

## 2023-11-13 NOTE — Patient Instructions (Signed)
 Larry Blair , Thank you for taking time out of your busy schedule to complete your Annual Wellness Visit with me. I enjoyed our conversation and look forward to speaking with you again next year. I, as well as your care team,  appreciate your ongoing commitment to your health goals. Please review the following plan we discussed and let me know if I can assist you in the future. Your Game plan/ To Do List     Follow up Visits: We will see or speak with you next year for your Next Medicare AWV with our clinical staff Have you seen your provider in the last 6 months (3 months if uncontrolled diabetes)? Yes  Clinician Recommendations:  Aim for 30 minutes of exercise or brisk walking, 6-8 glasses of water, and 5 servings of fruits and vegetables each day.       This is a list of the screenings recommended for you:  Health Maintenance  Topic Date Due   COVID-19 Vaccine (6 - 2024-25 season) 12/08/2022   Medicare Annual Wellness Visit  11/12/2023   Flu Shot  11/07/2023   DTaP/Tdap/Td vaccine (2 - Td or Tdap) 07/01/2027   Colon Cancer Screening  08/27/2028   Pneumococcal Vaccine for age over 56  Completed   Hepatitis C Screening  Completed   Zoster (Shingles) Vaccine  Completed   Hepatitis B Vaccine  Aged Out   HPV Vaccine  Aged Out   Meningitis B Vaccine  Aged Out    Advanced directives: (Declined) Advance directive discussed with you today. Even though you declined this today, please call our office should you change your mind, and we can give you the proper paperwork for you to fill out. Advance Care Planning is important because it:  [x]  Makes sure you receive the medical care that is consistent with your values, goals, and preferences  [x]  It provides guidance to your family and loved ones and reduces their decisional burden about whether or not they are making the right decisions based on your wishes.  Follow the link provided in your after visit summary or read over the paperwork we  have mailed to you to help you started getting your Advance Directives in place. If you need assistance in completing these, please reach out to us  so that we can help you!  See attachments for Preventive Care and Fall Prevention Tips.

## 2023-12-31 DIAGNOSIS — H04123 Dry eye syndrome of bilateral lacrimal glands: Secondary | ICD-10-CM | POA: Diagnosis not present

## 2023-12-31 DIAGNOSIS — H2513 Age-related nuclear cataract, bilateral: Secondary | ICD-10-CM | POA: Diagnosis not present

## 2023-12-31 DIAGNOSIS — H526 Other disorders of refraction: Secondary | ICD-10-CM | POA: Diagnosis not present

## 2024-01-18 ENCOUNTER — Other Ambulatory Visit: Payer: Self-pay | Admitting: Internal Medicine

## 2024-01-18 DIAGNOSIS — E785 Hyperlipidemia, unspecified: Secondary | ICD-10-CM

## 2024-01-18 DIAGNOSIS — I10 Essential (primary) hypertension: Secondary | ICD-10-CM

## 2024-03-16 ENCOUNTER — Encounter: Payer: Self-pay | Admitting: Internal Medicine

## 2024-04-05 ENCOUNTER — Ambulatory Visit: Payer: Self-pay | Admitting: Internal Medicine

## 2024-04-05 ENCOUNTER — Ambulatory Visit (INDEPENDENT_AMBULATORY_CARE_PROVIDER_SITE_OTHER): Admitting: Internal Medicine

## 2024-04-05 ENCOUNTER — Encounter: Payer: Self-pay | Admitting: Internal Medicine

## 2024-04-05 VITALS — BP 138/94 | HR 66 | Temp 97.7°F | Resp 16 | Ht 71.0 in | Wt 186.0 lb

## 2024-04-05 DIAGNOSIS — R8290 Unspecified abnormal findings in urine: Secondary | ICD-10-CM | POA: Insufficient documentation

## 2024-04-05 DIAGNOSIS — J4489 Other specified chronic obstructive pulmonary disease: Secondary | ICD-10-CM

## 2024-04-05 DIAGNOSIS — N400 Enlarged prostate without lower urinary tract symptoms: Secondary | ICD-10-CM | POA: Diagnosis not present

## 2024-04-05 DIAGNOSIS — M150 Primary generalized (osteo)arthritis: Secondary | ICD-10-CM | POA: Diagnosis not present

## 2024-04-05 DIAGNOSIS — E785 Hyperlipidemia, unspecified: Secondary | ICD-10-CM | POA: Diagnosis not present

## 2024-04-05 DIAGNOSIS — I10 Essential (primary) hypertension: Secondary | ICD-10-CM | POA: Diagnosis not present

## 2024-04-05 LAB — URINALYSIS, ROUTINE W REFLEX MICROSCOPIC
Bilirubin Urine: NEGATIVE
Hgb urine dipstick: NEGATIVE
Ketones, ur: NEGATIVE
Leukocytes,Ua: NEGATIVE
Nitrite: NEGATIVE
RBC / HPF: NONE SEEN
Specific Gravity, Urine: 1.02 (ref 1.000–1.030)
Total Protein, Urine: NEGATIVE
Urine Glucose: NEGATIVE
Urobilinogen, UA: 0.2 (ref 0.0–1.0)
WBC, UA: NONE SEEN
pH: 6 (ref 5.0–8.0)

## 2024-04-05 LAB — LIPID PANEL
Cholesterol: 148 mg/dL (ref 28–200)
HDL: 69 mg/dL
LDL Cholesterol: 70 mg/dL (ref 10–99)
NonHDL: 79.39
Total CHOL/HDL Ratio: 2
Triglycerides: 48 mg/dL (ref 10.0–149.0)
VLDL: 9.6 mg/dL (ref 0.0–40.0)

## 2024-04-05 LAB — CBC WITH DIFFERENTIAL/PLATELET
Basophils Absolute: 0 K/uL (ref 0.0–0.1)
Basophils Relative: 0.5 % (ref 0.0–3.0)
Eosinophils Absolute: 0.1 K/uL (ref 0.0–0.7)
Eosinophils Relative: 2.3 % (ref 0.0–5.0)
HCT: 43.8 % (ref 39.0–52.0)
Hemoglobin: 14.6 g/dL (ref 13.0–17.0)
Lymphocytes Relative: 29.6 % (ref 12.0–46.0)
Lymphs Abs: 1.8 K/uL (ref 0.7–4.0)
MCHC: 33.2 g/dL (ref 30.0–36.0)
MCV: 98.8 fl (ref 78.0–100.0)
Monocytes Absolute: 0.6 K/uL (ref 0.1–1.0)
Monocytes Relative: 10.2 % (ref 3.0–12.0)
Neutro Abs: 3.6 K/uL (ref 1.4–7.7)
Neutrophils Relative %: 57.4 % (ref 43.0–77.0)
Platelets: 226 K/uL (ref 150.0–400.0)
RBC: 4.44 Mil/uL (ref 4.22–5.81)
RDW: 12.7 % (ref 11.5–15.5)
WBC: 6.2 K/uL (ref 4.0–10.5)

## 2024-04-05 LAB — HEPATIC FUNCTION PANEL
ALT: 23 U/L (ref 3–53)
AST: 19 U/L (ref 5–37)
Albumin: 4.5 g/dL (ref 3.5–5.2)
Alkaline Phosphatase: 58 U/L (ref 39–117)
Bilirubin, Direct: 0.1 mg/dL (ref 0.1–0.3)
Total Bilirubin: 0.7 mg/dL (ref 0.2–1.2)
Total Protein: 7.3 g/dL (ref 6.0–8.3)

## 2024-04-05 LAB — BASIC METABOLIC PANEL WITH GFR
BUN: 13 mg/dL (ref 6–23)
CO2: 33 meq/L — ABNORMAL HIGH (ref 19–32)
Calcium: 9.7 mg/dL (ref 8.4–10.5)
Chloride: 98 meq/L (ref 96–112)
Creatinine, Ser: 0.79 mg/dL (ref 0.40–1.50)
GFR: 90.05 mL/min
Glucose, Bld: 88 mg/dL (ref 70–99)
Potassium: 4.1 meq/L (ref 3.5–5.1)
Sodium: 140 meq/L (ref 135–145)

## 2024-04-05 LAB — PSA: PSA: 1.85 ng/mL (ref 0.10–4.00)

## 2024-04-05 MED ORDER — BREZTRI AEROSPHERE 160-9-4.8 MCG/ACT IN AERO: 2.0000 | INHALATION_SPRAY | Freq: Two times a day (BID) | RESPIRATORY_TRACT | 1 refills | Status: AC

## 2024-04-05 NOTE — Patient Instructions (Signed)

## 2024-04-05 NOTE — Progress Notes (Unsigned)
 "  Subjective:  Patient ID: Larry Blair, male    DOB: Sep 03, 1955  Age: 68 y.o. MRN: [id number]  CC: Hypertension, Hyperlipidemia, and COPD   HPI Larry Blair presents for f/up ---  Discussed the use of AI scribe software for clinical note transcription with the patient, who gave verbal consent to proceed.  History of Present Illness Larry Blair is a 68 year old male who presents with elevated blood pressure and joint pain.  He attributes his elevated blood pressure to recent THC use, having smoked about an hour prior to the visit. He has been using THC since 1974 to help with his pain. No headache, blurred vision, chest pain, or shortness of breath associated with THC use.  He experiences chronic pain in both shoulders and his elbow; he speculates that he may have tendinitis in his elbow. The elbow pain worsens with certain movements, such as reaching up to his face or using turn signals while driving, which he describes as a 'repetitive motion type thing.'  He has been experiencing wheezing for some time and was previously prescribed Breztri , but he has not been able to afford a refill for the past 8-9 months. Currently, he uses an albuterol  inhaler a couple of times a week.  He reports swelling in his ankle at the end of the day after walking, accompanied by pain. Additionally, his knee pain, described as 'ball on ball,' is persistent.  He notes a change in his urinary symptoms, with a stronger odor and urgency since his surgery. He mentions 'my plumbing leaks' and that the urgency has worsened since the surgery.     Outpatient Medications Prior to Visit  Medication Sig Dispense Refill   acetaminophen  (TYLENOL ) 650 MG CR tablet Take 1,300 mg by mouth every 8 (eight) hours as needed for pain.     albuterol  (VENTOLIN  HFA) 108 (90 Base) MCG/ACT inhaler Inhale 2 puffs into the lungs every 6 (six) hours as needed for wheezing or shortness of breath. 18 g 3   aspirin 81 MG chewable  tablet Chew 81 mg by mouth daily.     atorvastatin  (LIPITOR) 10 MG tablet TAKE 1 TABLET BY MOUTH ONCE DAILY *FOLLOW UP WITH PCP PRIOR TO FUTURE REFILLS* 90 tablet 0   bisacodyl (DULCOLAX) 5 MG EC tablet Take 5 mg by mouth See admin instructions. Take 5 mg daily, may take a second 5 mg dose as needed for constipation     Docusate Sodium (COLACE PO) Take 1 tablet by mouth daily at 6 (six) AM.     fexofenadine (ALLEGRA) 180 MG tablet Take 180 mg by mouth daily.     fluocinonide  cream (LIDEX ) 0.05 % Apply topically 2 (two) times daily. 120 g 2   indapamide  (LOZOL ) 1.25 MG tablet TAKE 1 TABLET BY MOUTH ONCE DAILY *FOLLOW UP WITH PCP PRIOR TO FUTURE REFILLS* 90 tablet 0   Multiple Vitamin (MULTIVITAMIN WITH MINERALS) TABS tablet Take 1 tablet by mouth daily. Centrum Silver     budeson-glycopyrrolate-formoterol (BREZTRI  AEROSPHERE) 160-9-4.8 MCG/ACT AERO Inhale 2 puffs into the lungs 2 (two) times daily. 33 g 5   terbinafine  (LAMISIL ) 250 MG tablet Take 1 tablet (250 mg total) by mouth daily. 90 tablet 0   carvedilol  (COREG ) 3.125 MG tablet Take 1 tablet (3.125 mg total) by mouth 2 (two) times daily with a meal. (Patient not taking: Reported on 11/13/2023) 180 tablet 0   No facility-administered medications prior to visit.    ROS Review of Systems  Constitutional: Negative.  Negative for appetite change, chills, diaphoresis, fatigue and fever.  HENT: Negative.    Eyes: Negative.  Negative for visual disturbance.  Respiratory: Negative.  Negative for cough, chest tightness, shortness of breath and wheezing.   Cardiovascular:  Negative for chest pain, palpitations and leg swelling.  Gastrointestinal: Negative.  Negative for abdominal pain, constipation, diarrhea, nausea and vomiting.  Genitourinary:  Positive for dysuria. Negative for difficulty urinating, hematuria, scrotal swelling, testicular pain and urgency.  Musculoskeletal:  Positive for arthralgias and neck pain. Negative for joint swelling.   Skin: Negative.   Neurological:  Negative for dizziness, weakness and headaches.  Hematological:  Negative for adenopathy. Does not bruise/bleed easily.  Psychiatric/Behavioral: Negative.      Objective:  BP (!) 138/94 (BP Location: Left Arm, Patient Position: Sitting, Cuff Size: Normal)   Pulse 66   Temp 97.7 F (36.5 C) (Oral)   Resp 16   Ht 5' 11 (1.803 m)   Wt 186 lb (84.4 kg)   SpO2 95%   BMI 25.94 kg/m   BP Readings from Last 3 Encounters:  04/05/24 (!) 138/94  10/03/23 (!) 142/86  08/28/23 (!) 141/93    Wt Readings from Last 3 Encounters:  04/05/24 186 lb (84.4 kg)  11/13/23 180 lb (81.6 kg)  10/03/23 180 lb (81.6 kg)    Physical Exam Vitals reviewed.  Constitutional:      General: He is not in acute distress.    Appearance: He is not toxic-appearing or diaphoretic.  HENT:     Mouth/Throat:     Mouth: Mucous membranes are moist.  Eyes:     General: No scleral icterus.    Conjunctiva/sclera: Conjunctivae normal.  Cardiovascular:     Rate and Rhythm: Normal rate and regular rhythm.     Heart sounds: No murmur heard.    No friction rub. No gallop.     Comments: EKG--- NSR, 65 bpm No LVH, Q waves, or ST/T wave changes  Pulmonary:     Effort: Pulmonary effort is normal. No respiratory distress.     Breath sounds: Examination of the right-upper field reveals rhonchi. Examination of the left-upper field reveals rhonchi. Rhonchi present. No decreased breath sounds, wheezing or rales.  Abdominal:     General: Abdomen is flat.     Palpations: There is no mass.     Tenderness: There is no abdominal tenderness. There is no guarding.     Hernia: No hernia is present.  Musculoskeletal:        General: Normal range of motion.     Cervical back: Neck supple.     Right lower leg: No edema.     Left lower leg: No edema.  Lymphadenopathy:     Cervical: No cervical adenopathy.  Skin:    General: Skin is warm and dry.  Neurological:     General: No focal deficit  present.     Mental Status: He is alert.  Psychiatric:        Mood and Affect: Mood normal.        Behavior: Behavior normal.     Lab Results  Component Value Date   WBC 6.2 04/05/2024   HGB 14.6 04/05/2024   HCT 43.8 04/05/2024   PLT 226.0 04/05/2024   GLUCOSE 88 04/05/2024   CHOL 148 04/05/2024   TRIG 48.0 04/05/2024   HDL 69.00 04/05/2024   LDLCALC 70 04/05/2024   ALT 23 04/05/2024   AST 19 04/05/2024   NA 140 04/05/2024   K 4.1  04/05/2024   CL 98 04/05/2024   CREATININE 0.79 04/05/2024   BUN 13 04/05/2024   CO2 33 (H) 04/05/2024   TSH 1.84 07/01/2023   PSA 1.85 04/05/2024   INR 1.0 08/30/2019    DG C-Arm 1-60 Min Result Date: 08/30/2019 CLINICAL DATA:  C-spine fusion EXAM: CERVICAL SPINE - 2-3 VIEW; DG C-ARM 1-60 MIN COMPARISON:  MRI 08/26/2019 FINDINGS: Three low resolution intraoperative spot views of the cervical spine. Total fluoroscopy time was 7 seconds. Initial image demonstrates localizing instrument anterior to C4. Subsequent images demonstrate placement of surgical plate and fixating screws with interbody device at C3-C4. IMPRESSION: Intraoperative fluoroscopic assistance provided during cervical spine surgery. Electronically Signed   By: Luke Bun M.D.   On: 08/30/2019 16:48   DG Cervical Spine 2-3 Views Result Date: 08/30/2019 CLINICAL DATA:  C-spine fusion EXAM: CERVICAL SPINE - 2-3 VIEW; DG C-ARM 1-60 MIN COMPARISON:  MRI 08/26/2019 FINDINGS: Three low resolution intraoperative spot views of the cervical spine. Total fluoroscopy time was 7 seconds. Initial image demonstrates localizing instrument anterior to C4. Subsequent images demonstrate placement of surgical plate and fixating screws with interbody device at C3-C4. IMPRESSION: Intraoperative fluoroscopic assistance provided during cervical spine surgery. Electronically Signed   By: Luke Bun M.D.   On: 08/30/2019 16:48   Chest 2 View Result Date: 08/30/2019 CLINICAL DATA:  Preoperative study for  cervical fusion. EXAM: CHEST - 2 VIEW COMPARISON:  None. FINDINGS: The heart size and mediastinal contours are within normal limits. Normal pulmonary vascularity. No focal consolidation, pleural effusion, or pneumothorax. No acute osseous abnormality. IMPRESSION: No active cardiopulmonary disease. Electronically Signed   By: Elsie ONEIDA Shoulder M.D.   On: 08/30/2019 12:22   The 10-year ASCVD risk score (Arnett DK, et al., 2019) is: 18.2%   Values used to calculate the score:     Age: 69 years     Clinically relevant sex: Male     Is Non-Hispanic African American: Yes     Diabetic: No     Tobacco smoker: No     Systolic Blood Pressure: 138 mmHg     Is BP treated: Yes     HDL Cholesterol: 69 mg/dL     Total Cholesterol: 148 mg/dL   Assessment & Plan:   Essential hypertension- He has not achieved his BP goal. Will add amlodipine. -     EKG 12-Lead -     Basic metabolic panel with GFR; Future -     CBC with Differential/Platelet; Future -     amLODIPine Besylate; Take 1 tablet (5 mg total) by mouth daily.  Dispense: 90 tablet; Refill: 0 -     AMB Referral VBCI Care Management  Benign prostatic hyperplasia without lower urinary tract symptoms -     PSA; Future  Hyperlipidemia LDL goal <70 -     Hepatic function panel; Future -     Lipid panel; Future  COPD with asthma (HCC) -     Breztri  Aerosphere; Inhale 2 puffs into the lungs 2 (two) times daily.  Dispense: 33 g; Refill: 1 -     AMB Referral VBCI Care Management  Foul smelling urine -     Urinalysis, Routine w reflex microscopic; Future -     CULTURE, URINE COMPREHENSIVE; Future  Primary osteoarthritis involving multiple joints -     Ambulatory referral to Orthopedic Surgery     Follow-up: Return in about 3 months (around 07/04/2024).  Debby Molt, MD "

## 2024-04-06 ENCOUNTER — Telehealth: Payer: Self-pay

## 2024-04-06 MED ORDER — AMLODIPINE BESYLATE 5 MG PO TABS: 5.0000 mg | ORAL_TABLET | Freq: Every day | ORAL | 0 refills | Status: AC

## 2024-04-06 NOTE — Progress Notes (Signed)
 Complex Care Management Note Care Guide Note  04/06/2024 Name: Larry Blair MRN: [id number] DOB: 11/10/1955   Complex Care Management Outreach Attempts: An unsuccessful telephone outreach was attempted today to offer the patient information about available complex care management services.  Follow Up Plan:  Additional outreach attempts will be made to offer the patient complex care management information and services.   Encounter Outcome:  No Answer  [facility]  [facility], [facility] Guide Direct [phone]  Fax: [phone] Website: [email or web address]

## 2024-04-07 LAB — CULTURE, URINE COMPREHENSIVE: RESULT:: NO GROWTH

## 2024-04-12 NOTE — Progress Notes (Signed)
 Care Guide Pharmacy Note  04/12/2024 Name: Larry Blair MRN: [id number] DOB: 12/05/1955  Referred By: Larry Debby CROME, MD Reason for referral: Complex Care Management (Outreach to schedule with pharm D )   Larry Blair is a 69 y.o. year old male who is a primary care patient of Larry Debby CROME, MD.  Larry Blair was referred to the pharmacist for assistance related to: COPD and DMII  Successful contact was made with the patient to discuss pharmacy services including being ready for the pharmacist to call at least 5 minutes before the scheduled appointment time and to have medication bottles and any blood pressure readings ready for review. The patient agreed to meet with the pharmacist via telephone visit on (date/time).1.14.2026  Larry Blair  [facility], [facility] Guide Direct Dial:[phone]  Fax: [phone] Website: [email or web address]

## 2024-04-19 ENCOUNTER — Other Ambulatory Visit: Payer: Self-pay | Admitting: Internal Medicine

## 2024-04-19 DIAGNOSIS — I10 Essential (primary) hypertension: Secondary | ICD-10-CM

## 2024-04-19 DIAGNOSIS — E785 Hyperlipidemia, unspecified: Secondary | ICD-10-CM

## 2024-04-20 ENCOUNTER — Ambulatory Visit

## 2024-04-20 ENCOUNTER — Ambulatory Visit: Admitting: Orthopedic Surgery

## 2024-04-20 VITALS — BP 129/84 | HR 77 | Ht 71.0 in | Wt 186.0 lb

## 2024-04-20 DIAGNOSIS — M13871 Other specified arthritis, right ankle and foot: Secondary | ICD-10-CM

## 2024-04-20 DIAGNOSIS — M25562 Pain in left knee: Secondary | ICD-10-CM

## 2024-04-20 DIAGNOSIS — M1712 Unilateral primary osteoarthritis, left knee: Secondary | ICD-10-CM

## 2024-04-20 DIAGNOSIS — G8929 Other chronic pain: Secondary | ICD-10-CM

## 2024-04-20 DIAGNOSIS — M25571 Pain in right ankle and joints of right foot: Secondary | ICD-10-CM

## 2024-04-20 MED ORDER — LIDOCAINE HCL 1 % IJ SOLN
5.0000 mL | INTRAMUSCULAR | Status: AC | PRN
  Administered 2024-04-20: 5 mL

## 2024-04-20 MED ORDER — TRIAMCINOLONE ACETONIDE 40 MG/ML IJ SUSP
40.0000 mg | INTRAMUSCULAR | Status: AC | PRN
  Administered 2024-04-20: 40 mg via INTRA_ARTICULAR

## 2024-04-20 NOTE — Progress Notes (Signed)
 "  Office Visit Note   Patient: Larry Blair           Date of Birth: 05/24/1955           MRN: [id number] Visit Date: 04/20/2024              Requested by: Joshua Debby CROME, MD [address] [place],  [place] [address] PCP: Joshua Debby CROME, MD   Assessment & Plan: Visit Diagnoses:  1. Arthritis of knee, left   2. Chronic pain of left knee   3. Pain in right ankle and joints of right foot   4. Allergic arthritis of right ankle     Plan: Natural history and expected course discussed. Questions answered. Recommend rest and ice. PT referral for knee strengthening and ROM. OTC analgesics as needed. Hinged knee brace. Discussed intraarticular steroid injection of the left knee with patient. After discussing the risks(which include skin discoloration, infection, risk of speeding up arthritis, and risk of hyperglycemia in diabetics) and benefits, they opted to proceed with steroid injection. Follow up in 3 months. For the ankle, patient referred to foot and ankle specialist for further evaluation.  Orders:  Orders Placed This Encounter  Procedures   Large Joint Inj: L knee   DG Knee 3 Views Left   DG Ankle Complete Right   Ambulatory referral to Physical Therapy   Ambulatory referral to Podiatry   Large Joint Inj: L knee on 04/20/2024 3:14 PM Indications: pain Details: 22 G 1.5 in needle, anterolateral approach Medications: 5 mL lidocaine  1 %; 40 mg triamcinolone  acetonide 40 MG/ML Outcome: tolerated well, no immediate complications Procedure, treatment alternatives, risks and benefits explained, specific risks discussed. Consent was given by the patient. Patient was prepped and draped in the usual sterile fashion.       Subjective: Chief Complaint: Left knee pain, right ankle pain  HPI Patient is a 69 y.o. year old male who presents with knee pain involving the  left knee. Onset of the symptoms was several years ago. Inciting event: this is a longstanding problem which  has been getting worse. Current symptoms include giving out and pain located in the front of the knee. Pain is aggravated by any weight bearing and going up and down stairs.  Treatment to date: none. Also reports that he has ankle pain for many years. Has sprained the ankle many times and reports that now it hurts when he walks. No other complaints  Objective: Vital Signs: BP 129/84 (Cuff Size: Normal)   Pulse 77   Ht 5' 11 (1.803 m)   Wt 186 lb (84.4 kg)   BMI 25.94 kg/m   Physical Exam Gen: Alert, No Acute Distress left knee: Skin intact, no erythema or induration noted. medial joint line tenderness to palpation. Range of motion -5 to 105. No instability with varus or valgus stress. Right ankle: Skin intact, no erythema or induration noted. There is no TTP. Restricted ROM of ankle. NVID  Imaging: Radiographs personally reviewed by me; reveal severe osteoarthritis of the left knee. Radiographs of the right ankle reveal moderate OA.   PMFS History: Patient Active Problem List   Diagnosis Date Noted   Foul smelling urine 04/05/2024   Primary osteoarthritis involving multiple joints 04/05/2024   Onychomycosis of toenail 07/02/2023   Tinnitus aurium, right 07/02/2023   Screening for colon cancer 07/04/2022   COPD with asthma (HCC) 08/15/2021   Chronic bilateral low back pain without sciatica 08/15/2021   Benign prostatic hyperplasia without  lower urinary tract symptoms 10/03/2020   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   Diuretic-induced hypokalemia 11/24/2017   Chronic eczema of hand 08/11/2017   Hyperlipidemia LDL goal <70 07/01/2017   Essential hypertension 06/30/2017   Vitamin D  deficiency 06/30/2017   Past Medical History:  Diagnosis Date   Allergy    Arthritis    bilateral shoulders/LEFT knee   Asthma    Eczema    HTN (hypertension)    Hyperlipidemia    Substance abuse (HCC) 1976   daily    Family History  Problem Relation Age of Onset   Colon polyps Father 20    Colon cancer Father 31   Early death Sister    Kidney failure Sister    Early death Brother    Kidney failure Brother    Tongue cancer Maternal Aunt    Pancreatic cancer Maternal Aunt    Cancer Maternal Grandmother    Cancer Maternal Grandfather    Esophageal cancer Neg Hx    Stomach cancer Neg Hx    Rectal cancer Neg Hx     Past Surgical History:  Procedure Laterality Date   ANTERIOR CERVICAL DECOMP/DISCECTOMY FUSION N/A 08/30/2019   Procedure: CERVICAL THREE-FOUR ANTERIOR CERVICAL DECOMPRESSION/FUSION;  Surgeon: Joshua Alm RAMAN, MD;  Location: [facility] OR;  Service: Neurosurgery;  Laterality: N/A;   COLONOSCOPY  09/2017   HD-MAC-Plenvu  (exc)-hems   KNEE ARTHROSCOPY Left    WISDOM TOOTH EXTRACTION     Social History   Occupational History   Occupation: RETIRED  Tobacco Use   Smoking status: Former    Current packs/day: 0.00    Types: Cigarettes, Cigars    Quit date: 06/30/2012    Years since quitting: 11.8   Smokeless tobacco: Former    Types: Chew    Quit date: 07/01/1987  Vaping Use   Vaping status: Never Used  Substance and Sexual Activity   Alcohol use: Yes    Alcohol/week: 21.0 standard drinks of alcohol    Types: 21 Standard drinks or equivalent per week    Comment: + occasional beer   Drug use: Yes    Frequency: 7.0 times per week    Types: Marijuana    Comment: several times a day   Sexual activity: Yes    Partners: Female   Current Outpatient Medications  Medication Instructions   acetaminophen  (TYLENOL ) 1,300 mg, Every 8 hours PRN   albuterol  (VENTOLIN  HFA) 108 (90 Base) MCG/ACT inhaler 2 puffs, Inhalation, Every 6 hours PRN   amLODipine  (NORVASC ) 5 mg, Oral, Daily   aspirin 81 mg, Daily   atorvastatin  (LIPITOR) 10 MG tablet TAKE 1 TABLET BY MOUTH ONCE DAILY *FOLLOW UP WITH PCP PRIOR TO FUTURE REFILLS*   bisacodyl (DULCOLAX) 5 mg, See admin instructions   budesonide-glycopyrrolate-formoterol (BREZTRI  AEROSPHERE) 160-9-4.8 MCG/ACT AERO inhaler 2 puffs,  Inhalation, 2 times daily   Docusate Sodium (COLACE PO) 1 tablet, Daily   fexofenadine (ALLEGRA) 180 mg, Daily   fluocinonide  cream (LIDEX ) 0.05 % Topical, 2 times daily   indapamide  (LOZOL ) 1.25 MG tablet TAKE 1 TABLET BY MOUTH ONCE DAILY *FOLLOW UP WITH PCP PRIOR TO FUTURE REFILLS*   Multiple Vitamin (MULTIVITAMIN WITH MINERALS) TABS tablet 1 tablet, Daily   Allergies as of 04/20/2024 - Review Complete 04/05/2024  Allergen Reaction Noted   Elemental sulfur  08/26/2019   "

## 2024-04-21 ENCOUNTER — Other Ambulatory Visit: Admitting: Pharmacist

## 2024-04-21 DIAGNOSIS — I10 Essential (primary) hypertension: Secondary | ICD-10-CM

## 2024-04-21 NOTE — Progress Notes (Signed)
 "  04/21/2024 Name: Larry Blair MRN: [id number] DOB: January 01, 1956  Chief Complaint  Patient presents with   Hypertension   Medication Management    Larry Blair is a 69 y.o. year old male who presented for a telephone visit.   They were referred to the pharmacist by their PCP for assistance in managing hypertension.    Subjective:  Care Team: Primary Care Provider: Joshua Debby CROME, MD ; Next Scheduled Visit: 07/05/24  Medication Access/Adherence  Current Pharmacy:  [facility] [address], [place] - [address] [place] [address] Phone: [phone] Fax: [phone]   Patient reports affordability concerns with their medications: No  Patient reports access/transportation concerns to their pharmacy: No  Patient reports adherence concerns with their medications:  No     Hypertension:  Current medications: Amlodipine  5 mg daily, indapamide  1.25 mg daily Medications previously tried: carvedilol , chlorthalidone , spironolactone   Reports no issues since starting amlodipine   Patient does not have a validated, automated, upper arm home BP cuff   Patient denies hypotensive s/sx including dizziness, lightheadedness.     Objective:  BP Readings from Last 3 Encounters:  04/20/24 129/84  04/05/24 (!) 138/94  10/03/23 (!) 142/86     No results found for: HGBA1C  Lab Results  Component Value Date   CREATININE 0.79 04/05/2024   BUN 13 04/05/2024   NA 140 04/05/2024   K 4.1 04/05/2024   CL 98 04/05/2024   CO2 33 (H) 04/05/2024    Lab Results  Component Value Date   CHOL 148 04/05/2024   HDL 69.00 04/05/2024   LDLCALC 70 04/05/2024   TRIG 48.0 04/05/2024   CHOLHDL 2 04/05/2024    Medications Reviewed Today     Reviewed by Merceda Lela SAUNDERS, RPH-CPP (Pharmacist) on 04/21/24 at 1430  Med List Status: <None>   Medication Order Taking? Sig Documenting Provider Last Dose Status Informant  acetaminophen  (TYLENOL ) 650 MG CR  tablet [id number]  Take 1,300 mg by mouth every 8 (eight) hours as needed for pain. [provider]  Active Self  albuterol  (VENTOLIN  HFA) 108 (90 Base) MCG/ACT inhaler [id number]  Inhale 2 puffs into the lungs every 6 (six) hours as needed for wheezing or shortness of breath. Joshua Debby CROME, MD  Active   amLODipine  (NORVASC ) 5 MG tablet [id number] Yes Take 1 tablet (5 mg total) by mouth daily. Joshua Debby CROME, MD  Active   aspirin 81 MG chewable tablet [id number]  Chew 81 mg by mouth daily. [provider]  Active   atorvastatin  (LIPITOR) 10 MG tablet [id number] Yes TAKE 1 TABLET BY MOUTH ONCE DAILY *FOLLOW UP WITH PCP PRIOR TO FUTURE REFILLSDEWAINE Joshua Debby CROME, MD  Active   bisacodyl (DULCOLAX) 5 MG EC tablet [id number]  Take 5 mg by mouth See admin instructions. Take 5 mg daily, may take a second 5 mg dose as needed for constipation [provider]  Active Self  budesonide-glycopyrrolate-formoterol (BREZTRI  AEROSPHERE) 160-9-4.8 MCG/ACT AERO inhaler [id number]  Inhale 2 puffs into the lungs 2 (two) times daily. Joshua Debby CROME, MD  Active   Docusate Sodium (COLACE PO) [id number]  Take 1 tablet by mouth daily at 6 (six) AM. [provider]  Active   fexofenadine (ALLEGRA) 180 MG tablet [id number]  Take 180 mg by mouth daily. [provider]  Active Self  fluocinonide  cream (LIDEX ) 0.05 % [id number]  Apply topically 2 (two) times daily. Joshua Debby CROME, MD  Active   indapamide  (LOZOL ) 1.25  MG tablet [id number] Yes TAKE 1 TABLET BY MOUTH ONCE DAILY *FOLLOW  UP  WITH  PCP  PRIOR  TO  FUTURE  REFILLSDEWAINE Joshua Debby LITTIE, MD  Active   Multiple Vitamin (MULTIVITAMIN WITH MINERALS) TABS tablet [id number]  Take 1 tablet by mouth daily. Centrum Silver [provider]  Active Self              Assessment/Plan:   Hypertension: - Currently controlled, BP goal <130/80. BP improved at appointment yesterday  - Recommend to continue current regimen     Follow Up  Plan: PCP f/u 3/30  Darrelyn Drum, PharmD, BCPS, CPP Clinical Pharmacist Practitioner [facility] [phone]    "

## 2024-04-21 NOTE — Patient Instructions (Signed)
 It was a pleasure speaking with you today!  Continue your current regimen.  Feel free to call with any questions or concerns!  Arbutus Leas, PharmD, BCPS, CPP Clinical Pharmacist Practitioner [facility] [phone]

## 2024-04-22 ENCOUNTER — Ambulatory Visit (INDEPENDENT_AMBULATORY_CARE_PROVIDER_SITE_OTHER)

## 2024-04-22 DIAGNOSIS — M25562 Pain in left knee: Secondary | ICD-10-CM

## 2024-04-22 DIAGNOSIS — G8929 Other chronic pain: Secondary | ICD-10-CM

## 2024-04-22 NOTE — Progress Notes (Signed)
 Patient came in today and received a hinged knee brace

## 2024-04-27 ENCOUNTER — Other Ambulatory Visit: Payer: Self-pay | Admitting: Physician Assistant

## 2024-04-27 ENCOUNTER — Encounter: Payer: Self-pay | Admitting: *Deleted

## 2024-04-27 ENCOUNTER — Ambulatory Visit: Payer: Self-pay | Admitting: Physician Assistant

## 2024-04-27 DIAGNOSIS — R936 Abnormal findings on diagnostic imaging of limbs: Secondary | ICD-10-CM

## 2024-04-27 DIAGNOSIS — M7122 Synovial cyst of popliteal space [Baker], left knee: Secondary | ICD-10-CM

## 2024-04-27 NOTE — Progress Notes (Signed)
 Mat Stuard                                          MRN: [id number]   04/27/2024   The VBCI Quality Team Specialist reviewed this patient medical record for the purposes of chart review for care gap closure. The following were reviewed: chart review for care gap closure-controlling blood pressure.    VBCI Quality Team

## 2024-04-28 ENCOUNTER — Telehealth: Payer: Self-pay | Admitting: Physician Assistant

## 2024-04-28 NOTE — Telephone Encounter (Signed)
 Pt called asking for a call back from [place]. Pt states he has additional questions about xray convo they had. Please call pt at [phone].

## 2024-04-28 NOTE — Telephone Encounter (Signed)
 U/s order was placed

## 2024-04-28 NOTE — Telephone Encounter (Signed)
 I called pt back. I informed him about the reason he needs a u/s. Pt is ok to proceed with this. Pt would like to go to [place]

## 2024-05-05 ENCOUNTER — Ambulatory Visit
Admission: RE | Admit: 2024-05-05 | Discharge: 2024-05-05 | Disposition: A | Discharge status: Home / Self Care | Source: Ambulatory Visit | Attending: Physician Assistant | Admitting: Physician Assistant

## 2024-05-05 DIAGNOSIS — M7122 Synovial cyst of popliteal space [Baker], left knee: Secondary | ICD-10-CM | POA: Insufficient documentation

## 2024-05-05 DIAGNOSIS — R936 Abnormal findings on diagnostic imaging of limbs: Secondary | ICD-10-CM | POA: Insufficient documentation

## 2024-05-06 ENCOUNTER — Ambulatory Visit: Admitting: Podiatry

## 2024-05-06 DIAGNOSIS — M25371 Other instability, right ankle: Secondary | ICD-10-CM

## 2024-05-06 DIAGNOSIS — M19071 Primary osteoarthritis, right ankle and foot: Secondary | ICD-10-CM

## 2024-05-06 NOTE — Progress Notes (Signed)
 "  Subjective:  Patient ID: Larry Blair, male    DOB: 1955/05/21,  MRN: [id number]  Chief Complaint  Patient presents with   Foot Pain    Right ankle pain had previous xray     69 y.o. male presents with the above complaint.  Patient presents with complaint of right ankle arthritis has been causing him a lot of discomfort hurts with ambulation or shoe pressure has not seen and was prior to seeing me is gotten worse over the last few days.  He has a history of chronic ankle sprain.  He does get some instability with the ankle joint wanted to discuss treatment options for this.  Denies any other acute complaints close 5 out of 10.   Review of Systems: Negative except as noted in the HPI. Denies N/V/F/Ch.  Past Medical History:  Diagnosis Date   Allergy    Arthritis    bilateral shoulders/LEFT knee   Asthma    Eczema    HTN (hypertension)    Hyperlipidemia    Substance abuse (HCC) 1976   daily   Current Medications[1]  Tobacco Use History[2]  Allergies[3] Objective:  There were no vitals filed for this visit. There is no height or weight on file to calculate BMI. Constitutional Well developed. Well nourished.  Vascular Dorsalis pedis pulses palpable bilaterally. Posterior tibial pulses palpable bilaterally. Capillary refill normal to all digits.  No cyanosis or clubbing noted. Pedal hair growth normal.  Neurologic Normal speech. Oriented to person, place, and time. Epicritic sensation to light touch grossly present bilaterally.  Dermatologic Nails well groomed and normal in appearance. No open wounds. No skin lesions.  Orthopedic: Pain on palpation right ATFL ligament pain at the right lateral gutter of the ankle joint pain with range of motion of the ankle joint mild crepitus clinically appreciated.  Chronic ankle instability is noted.   Radiographs: FINDINGS: No fracture or malalignment. Small plantar calcaneal spur. Moderate tibiotalar degenerative changes.  Nonacute appearing calcifications at the medial malleolus   IMPRESSION: No acute osseous abnormality.  Moderate ankle arthritis   Assessment:   1. Arthritis of ankle, right   2. Ankle instability, right    Plan:  Patient was evaluated and treated and all questions answered.  Right ankle arthritis with history of chronic ankle instability and sprain - All questions and concerns were discussed with the patient extensive detail given the amount of pain that is having a benefit from steroid injection of decreasing inflammatory, surgical pain.  Patient agrees with plan of to proceed with steroid injection -A steroid injection was performed at right ankle joint using 1% plain Lidocaine  and 10 mg of Kenalog . This was well tolerated. - Shoe gear modification discussed - Tri-Lock ankle brace dispensed for ankle instability   No follow-ups on file.     [1]  Current Outpatient Medications:    acetaminophen  (TYLENOL ) 650 MG CR tablet, Take 1,300 mg by mouth every 8 (eight) hours as needed for pain., Disp: , Rfl:    albuterol  (VENTOLIN  HFA) 108 (90 Base) MCG/ACT inhaler, Inhale 2 puffs into the lungs every 6 (six) hours as needed for wheezing or shortness of breath., Disp: 18 g, Rfl: 3   amLODipine  (NORVASC ) 5 MG tablet, Take 1 tablet (5 mg total) by mouth daily., Disp: 90 tablet, Rfl: 0   aspirin 81 MG chewable tablet, Chew 81 mg by mouth daily., Disp: , Rfl:    atorvastatin  (LIPITOR) 10 MG tablet, TAKE 1 TABLET BY MOUTH ONCE DAILY *FOLLOW  UP WITH PCP PRIOR TO FUTURE REFILLS*, Disp: 90 tablet, Rfl: 0   bisacodyl (DULCOLAX) 5 MG EC tablet, Take 5 mg by mouth See admin instructions. Take 5 mg daily, may take a second 5 mg dose as needed for constipation, Disp: , Rfl:    budesonide-glycopyrrolate-formoterol (BREZTRI  AEROSPHERE) 160-9-4.8 MCG/ACT AERO inhaler, Inhale 2 puffs into the lungs 2 (two) times daily., Disp: 33 g, Rfl: 1   Docusate Sodium (COLACE PO), Take 1 tablet by mouth daily at 6 (six)  AM., Disp: , Rfl:    fexofenadine (ALLEGRA) 180 MG tablet, Take 180 mg by mouth daily., Disp: , Rfl:    fluocinonide  cream (LIDEX ) 0.05 %, Apply topically 2 (two) times daily., Disp: 120 g, Rfl: 2   indapamide  (LOZOL ) 1.25 MG tablet, TAKE 1 TABLET BY MOUTH ONCE DAILY *FOLLOW  UP  WITH  PCP  PRIOR  TO  FUTURE  REFILLS*, Disp: 90 tablet, Rfl: 0   Multiple Vitamin (MULTIVITAMIN WITH MINERALS) TABS tablet, Take 1 tablet by mouth daily. Centrum Silver, Disp: , Rfl:  [2]  Social History Tobacco Use  Smoking Status Former   Current packs/day: 0.00   Types: Cigarettes, Cigars   Quit date: 06/30/2012   Years since quitting: 11.8  Smokeless Tobacco Former   Types: Chew   Quit date: 07/01/1987  [3]  Allergies Allergen Reactions   Elemental Sulfur    "

## 2024-05-11 ENCOUNTER — Ambulatory Visit: Payer: Self-pay | Admitting: Physician Assistant

## 2024-07-05 ENCOUNTER — Ambulatory Visit: Admitting: Internal Medicine

## 2024-07-20 ENCOUNTER — Ambulatory Visit
